# Patient Record
Sex: Female | Born: 1990
Health system: Southern US, Community
[De-identification: ages and names within clinical notes are randomized; demographics above are authoritative.]

## PROBLEM LIST (undated history)

## (undated) ENCOUNTER — Inpatient Hospital Stay (HOSPITAL_COMMUNITY): Payer: Self-pay

## (undated) DIAGNOSIS — T8859XA Other complications of anesthesia, initial encounter: Secondary | ICD-10-CM

## (undated) DIAGNOSIS — I499 Cardiac arrhythmia, unspecified: Secondary | ICD-10-CM

## (undated) DIAGNOSIS — E119 Type 2 diabetes mellitus without complications: Secondary | ICD-10-CM

## (undated) DIAGNOSIS — R000 Tachycardia, unspecified: Secondary | ICD-10-CM

## (undated) DIAGNOSIS — R7303 Prediabetes: Secondary | ICD-10-CM

## (undated) DIAGNOSIS — O24419 Gestational diabetes mellitus in pregnancy, unspecified control: Secondary | ICD-10-CM

## (undated) DIAGNOSIS — R002 Palpitations: Secondary | ICD-10-CM

## (undated) DIAGNOSIS — G43909 Migraine, unspecified, not intractable, without status migrainosus: Secondary | ICD-10-CM

## (undated) DIAGNOSIS — R079 Chest pain, unspecified: Secondary | ICD-10-CM

## (undated) DIAGNOSIS — F32A Depression, unspecified: Secondary | ICD-10-CM

## (undated) DIAGNOSIS — T4145XA Adverse effect of unspecified anesthetic, initial encounter: Secondary | ICD-10-CM

## (undated) DIAGNOSIS — F329 Major depressive disorder, single episode, unspecified: Secondary | ICD-10-CM

## (undated) DIAGNOSIS — F419 Anxiety disorder, unspecified: Secondary | ICD-10-CM

## (undated) HISTORY — DX: Gestational diabetes mellitus in pregnancy, unspecified control: O24.419

## (undated) HISTORY — PX: NO PAST SURGERIES: SHX2092

## (undated) HISTORY — DX: Major depressive disorder, single episode, unspecified: F32.9

## (undated) HISTORY — DX: Other complications of anesthesia, initial encounter: T88.59XA

## (undated) HISTORY — DX: Depression, unspecified: F32.A

## (undated) HISTORY — DX: Adverse effect of unspecified anesthetic, initial encounter: T41.45XA

## (undated) HISTORY — PX: WISDOM TOOTH EXTRACTION: SHX21

---
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
## 2009-04-29 ENCOUNTER — Emergency Department: Payer: Self-pay | Admitting: Emergency Medicine

## 2016-03-10 ENCOUNTER — Encounter (HOSPITAL_COMMUNITY): Payer: Self-pay | Admitting: *Deleted

## 2016-03-10 ENCOUNTER — Inpatient Hospital Stay (HOSPITAL_COMMUNITY)
Admission: AD | Admit: 2016-03-10 | Discharge: 2016-03-10 | Disposition: A | Discharge status: Home / Self Care | Payer: Medicaid Other | Source: Ambulatory Visit | Attending: Obstetrics & Gynecology | Admitting: Obstetrics & Gynecology

## 2016-03-10 DIAGNOSIS — R399 Unspecified symptoms and signs involving the genitourinary system: Secondary | ICD-10-CM | POA: Diagnosis not present

## 2016-03-10 DIAGNOSIS — R350 Frequency of micturition: Secondary | ICD-10-CM | POA: Diagnosis present

## 2016-03-10 LAB — URINALYSIS, ROUTINE W REFLEX MICROSCOPIC
Bilirubin Urine: NEGATIVE
Glucose, UA: NEGATIVE mg/dL
Hgb urine dipstick: NEGATIVE
Ketones, ur: 15 mg/dL — AB
Leukocytes, UA: NEGATIVE
Nitrite: NEGATIVE
Protein, ur: NEGATIVE mg/dL
Specific Gravity, Urine: 1.025 (ref 1.005–1.030)
pH: 6.5 (ref 5.0–8.0)

## 2016-03-10 LAB — POCT PREGNANCY, URINE: Preg Test, Ur: NEGATIVE

## 2016-03-10 MED ORDER — CIPROFLOXACIN HCL 500 MG PO TABS: 500.0000 mg | ORAL_TABLET | Freq: Two times a day (BID) | ORAL | 0 refills | Status: DC

## 2016-03-10 NOTE — MAU Provider Note (Addendum)
  Faculty Practice OB/GYN MAU Attending Note  Subjective:  25 y.o. G1P1001 presents today with c/o of urinating too much. Also has suprapubic pain. Denies fevers, vaginal discharge, chills, polyphagia or polydipsia.  Symptoms have been going on for last 3 days.   Also requests comprehensive STI screening, "I am here anyway, so wanted to get it done".  Objective:  Blood pressure 128/71, pulse 81, temperature 98.2 F (36.8 C), temperature source Oral, resp. rate 18, weight 217 lb 12 oz (98.8 kg), last menstrual period 02/18/2016. GENERAL: Well-developed, well-nourished female in no acute distress.  HEENT: Normocephalic, atraumatic.   LUNGS: Normal respiratory effort HEART: Regular rate noted SKIN: Warm, dry and without erythema ABDOMEN: Soft, nondistended, mild suprapubic tenderness PSYCH: Normal mood and affect  Results for orders placed or performed during the hospital encounter of 03/10/16 (from the past 24 hour(s))  Urinalysis, Routine w reflex microscopic (not at [facility])     Status: Abnormal   Collection Time: 03/10/16  4:33 PM  Result Value Ref Range   Color, Urine YELLOW YELLOW   APPearance CLEAR CLEAR   Specific Gravity, Urine 1.025 1.005 - 1.030   pH 6.5 5.0 - 8.0   Glucose, UA NEGATIVE NEGATIVE mg/dL   Hgb urine dipstick NEGATIVE NEGATIVE   Bilirubin Urine NEGATIVE NEGATIVE   Ketones, ur 15 (A) NEGATIVE mg/dL   Protein, ur NEGATIVE NEGATIVE mg/dL   Nitrite NEGATIVE NEGATIVE   Leukocytes, UA NEGATIVE NEGATIVE  Pregnancy, urine POC     Status: None   Collection Time: 03/10/16  4:44 PM  Result Value Ref Range   Preg Test, Ur NEGATIVE NEGATIVE    Assessment & Plan:  UTI symptoms with negative UA. Will follow up urine culture Ciprofloxacin 500 mg po bid x 3 days prescribed Told to follow up with [facility] or [facility] for STI testing. Discharged to home.     Jaynie CollinsUGONNA  Nazim Kadlec, MD, FACOG Attending Obstetrician & Gynecologist Faculty Practice, [facility]  [place]

## 2016-03-10 NOTE — Discharge Instructions (Signed)

## 2016-03-10 NOTE — MAU Note (Signed)
Night before last, kept having to pee- couldn't hold.  Has Continued. Feeling pressure in lower abd.  Still feels like she needs to go after she does go. No pain associated with urination.

## 2016-03-12 LAB — URINE CULTURE

## 2016-11-05 ENCOUNTER — Emergency Department (HOSPITAL_COMMUNITY)
Admission: EM | Admit: 2016-11-05 | Discharge: 2016-11-05 | Disposition: A | Discharge status: Home / Self Care | Payer: Medicaid Other | Attending: Emergency Medicine | Admitting: Emergency Medicine

## 2016-11-05 ENCOUNTER — Encounter (HOSPITAL_COMMUNITY): Payer: Self-pay | Admitting: Nurse Practitioner

## 2016-11-05 DIAGNOSIS — M722 Plantar fascial fibromatosis: Secondary | ICD-10-CM

## 2016-11-05 MED ORDER — MELOXICAM 15 MG PO TABS: 15.0000 mg | ORAL_TABLET | Freq: Every day | ORAL | 0 refills | Status: DC

## 2016-11-05 NOTE — ED Triage Notes (Signed)
Pt presents with c/o L foot pain. The pain began about 1 month ago while she was doing strenuous work remodeling her home. The pain has gotten increasingly worse since onset. She denies any known injiures. shes tried epsom salt soaks, icy cold rub, rest, elevation, ibuprofen,  with no relief

## 2016-11-05 NOTE — ED Notes (Signed)
Patient Alert and oriented X4. Stable and ambulatory. Patient verbalized understanding of the discharge instructions.  Patient belongings were taken by the patient.  

## 2016-11-05 NOTE — ED Provider Notes (Signed)
[facility]-EMERGENCY DEPT Provider Note   CSN: [id number] Arrival date & time: 11/05/16  1529   By signing my name below, I, Clarisse Gouge, attest that this documentation has been prepared under the direction and in the presence of Ranyah Groeneveld, PA-C. Electronically signed, Clarisse Gouge, ED Scribe. 11/05/16. 4:23 PM.   History   Chief Complaint Chief Complaint  Patient presents with  . Foot Pain   The history is provided by the patient and medical records. No language interpreter was used.    Colleen Fields is a 26 y.o. female presents to the Emergency Department with concern for gradually worsening L foot pain x 1 month. She states she has been standing constantly for greater periods of time than normally while remodeling over the past month. She describes 10/10, aching, constant pain noted to the arch of the L foot. Pt reportedly wearing Nike airmax and states she has used undisclosed insoles without relief. No improvement noted with epsom salt soaks, icy hot rubs, rest, elevation or ibuprofen. No other modifying factors noted. No injuries or traumas noted to the foot.  Past Medical History:  Diagnosis Date  . Medical history non-contributory     There are no active problems to display for this patient.   Past Surgical History:  Procedure Laterality Date  . CESAREAN SECTION    . WISDOM TOOTH EXTRACTION      OB History    Gravida Para Term Preterm AB Living   SAB TAB Ectopic Multiple Live Births                   Home Medications    Prior to Admission medications   Medication Sig Start Date End Date Taking? Authorizing Provider  ciprofloxacin (CIPRO) 500 MG tablet Take 1 tablet (500 mg total) by mouth 2 (two) times daily. 03/10/16   Tereso Newcomer, MD    Family History History reviewed. No pertinent family history.  Social History Social History  Substance Use Topics  . Smoking status: Never Smoker  . Smokeless tobacco: Never Used  . Alcohol  use No     Allergies   Patient has no known allergies.   Review of Systems Review of Systems  Constitutional: Negative for chills and fever.  Musculoskeletal: Positive for arthralgias and gait problem. Negative for joint swelling.  Skin: Negative for wound.  Neurological: Negative for weakness and numbness.  All other systems reviewed and are negative.    Physical Exam Updated Vital Signs BP (!) 104/46   Pulse 90   Temp 98.8 F (37.1 C) (Oral)   Resp 17   SpO2 100%   Physical Exam  Constitutional: She is oriented to person, place, and time. She appears well-developed and well-nourished.  HENT:  Head: Normocephalic and atraumatic.  Eyes: Conjunctivae are normal. Pupils are equal, round, and reactive to light. Right eye exhibits no discharge. Left eye exhibits no discharge. No scleral icterus.  Neck: Normal range of motion. No JVD present. No tracheal deviation present.  Pulmonary/Chest: Effort normal. No stridor.  Musculoskeletal:  Normal appearing left foot. TTP over posterior tibial tendons and arch of the foot. No erythema, swelling. Pain with ankle dorsiflexion and toes dorsiflexion. Full ROM of foot and toes.   Neurological: She is alert and oriented to person, place, and time. Coordination normal.  Psychiatric: She has a normal mood and affect. Her behavior is normal. Judgment and thought content normal.  Nursing note and  vitals reviewed.    ED Treatments / Results  DIAGNOSTIC STUDIES: Oxygen Saturation is 100% on RA, NL by my interpretation.    COORDINATION OF CARE: 4:20 PM-Discussed next steps with pt. Pt verbalized understanding and is agreeable with the plan. Will order medication and ace wrap, then prepare for d/c and refer to podiatrist. Pt advised of symptomatic care at home and return precautions.    Labs (all labs ordered are listed, but only abnormal results are displayed) Labs Reviewed - No data to display  EKG  EKG Interpretation None        Radiology No results found.  Procedures Procedures (including critical care time)  Medications Ordered in ED Medications - No data to display   Initial Impression / Assessment and Plan / ED Course  I have reviewed the triage vital signs and the nursing notes.  Pertinent labs & imaging results that were available during my care of the patient were reviewed by me and considered in my medical decision making (see chart for details).     Pt in ed with what sounds like plantar fascitis. No trauma. No signs of infection. Full ROM of the foot and ankle. Unable to ambulate. Advised Ice, elevate, rest. Advised to get rigid inserts to help support arch. Mobic for inflammation. I do not think pt needs emergent imaging. Follow up with PCP or podiatry.   Vitals:   11/05/16 1535  BP: (!) 104/46  Pulse: 90  Resp: 17  Temp: 98.8 F (37.1 C)  TempSrc: Oral  SpO2: 100%     Final Clinical Impressions(s) / ED Diagnoses   Final diagnoses:  Plantar fasciitis of left foot    New Prescriptions New Prescriptions   MELOXICAM (MOBIC) 15 MG TABLET    Take 1 tablet (15 mg total) by mouth daily.   I personally performed the services described in this documentation, which was scribed in my presence. The recorded information has been reviewed and is accurate.    Jaynie Crumble, PA-C 11/05/16 1638    Lyndal Pulley, MD 11/05/16 Mikle Bosworth

## 2016-11-05 NOTE — Discharge Instructions (Signed)
Mobic daily. Get good rigid arch supports to prevent your arch from collapsing. Ice massage with frozen water bottle. Rest. Elevate. Crutches as needed. Follow up with orthopedics or podiatrist.

## 2017-06-19 ENCOUNTER — Encounter (HOSPITAL_COMMUNITY): Payer: Self-pay

## 2017-06-19 ENCOUNTER — Inpatient Hospital Stay (HOSPITAL_COMMUNITY)
Admission: AD | Admit: 2017-06-19 | Discharge: 2017-06-19 | Disposition: A | Discharge status: Home / Self Care | Payer: Self-pay | Source: Ambulatory Visit | Attending: Obstetrics and Gynecology | Admitting: Obstetrics and Gynecology

## 2017-06-19 DIAGNOSIS — Z3490 Encounter for supervision of normal pregnancy, unspecified, unspecified trimester: Secondary | ICD-10-CM

## 2017-06-19 DIAGNOSIS — O161 Unspecified maternal hypertension, first trimester: Secondary | ICD-10-CM

## 2017-06-19 DIAGNOSIS — Z3A09 9 weeks gestation of pregnancy: Secondary | ICD-10-CM | POA: Insufficient documentation

## 2017-06-19 DIAGNOSIS — Z711 Person with feared health complaint in whom no diagnosis is made: Secondary | ICD-10-CM

## 2017-06-19 DIAGNOSIS — O26891 Other specified pregnancy related conditions, first trimester: Secondary | ICD-10-CM | POA: Insufficient documentation

## 2017-06-19 DIAGNOSIS — R109 Unspecified abdominal pain: Secondary | ICD-10-CM | POA: Insufficient documentation

## 2017-06-19 HISTORY — DX: Migraine, unspecified, not intractable, without status migrainosus: G43.909

## 2017-06-19 LAB — URINALYSIS, ROUTINE W REFLEX MICROSCOPIC
Bilirubin Urine: NEGATIVE
Glucose, UA: NEGATIVE mg/dL
Hgb urine dipstick: NEGATIVE
Ketones, ur: NEGATIVE mg/dL
Leukocytes, UA: NEGATIVE
Nitrite: NEGATIVE
Protein, ur: NEGATIVE mg/dL
Specific Gravity, Urine: 1.021 (ref 1.005–1.030)
pH: 6 (ref 5.0–8.0)

## 2017-06-19 LAB — POCT PREGNANCY, URINE: Preg Test, Ur: POSITIVE — AB

## 2017-06-19 MED ORDER — PRENATAL GUMMIES/DHA & FA 0.4-32.5 MG PO CHEW: 1.0000 | CHEWABLE_TABLET | Freq: Every day | ORAL | 12 refills | Status: DC

## 2017-06-19 NOTE — Discharge Instructions (Signed)
First Trimester of Pregnancy The first trimester of pregnancy is from week 1 until the end of week 13 (months 1 through 3). A week after a sperm fertilizes an egg, the egg will implant on the wall of the uterus. This embryo will begin to develop into a baby. Genes from you and your partner will form the baby. The female genes will determine whether the baby will be a boy or a girl. At 6-8 weeks, the eyes and face will be formed, and the heartbeat can be seen on ultrasound. At the end of 12 weeks, all the baby's organs will be formed. Now that you are pregnant, you will want to do everything you can to have a healthy baby. Two of the most important things are to get good prenatal care and to follow your health care provider's instructions. Prenatal care is all the medical care you receive before the baby's birth. This care will help prevent, find, and treat any problems during the pregnancy and childbirth. Body changes during your first trimester Your body goes through many changes during pregnancy. The changes vary from woman to woman.  You may gain or lose a couple of pounds at first.  You may feel sick to your stomach (nauseous) and you may throw up (vomit). If the vomiting is uncontrollable, call your health care provider.  You may tire easily.  You may develop headaches that can be relieved by medicines. All medicines should be approved by your health care provider.  You may urinate more often. Painful urination may mean you have a bladder infection.  You may develop heartburn as a result of your pregnancy.  You may develop constipation because certain hormones are causing the muscles that push stool through your intestines to slow down.  You may develop hemorrhoids or swollen veins (varicose veins).  Your breasts may begin to grow larger and become tender. Your nipples may stick out more, and the tissue that surrounds them (areola) may become darker.  Your gums may bleed and may be  sensitive to brushing and flossing.  Dark spots or blotches (chloasma, mask of pregnancy) may develop on your face. This will likely fade after the baby is born.  Your menstrual periods will stop.  You may have a loss of appetite.  You may develop cravings for certain kinds of food.  You may have changes in your emotions from day to day, such as being excited to be pregnant or being concerned that something may go wrong with the pregnancy and baby.  You may have more vivid and strange dreams.  You may have changes in your hair. These can include thickening of your hair, rapid growth, and changes in texture. Some women also have hair loss during or after pregnancy, or hair that feels dry or thin. Your hair will most likely return to normal after your baby is born.  What to expect at prenatal visits During a routine prenatal visit:  You will be weighed to make sure you and the baby are growing normally.  Your blood pressure will be taken.  Your abdomen will be measured to track your baby's growth.  The fetal heartbeat will be listened to between weeks 10 and 14 of your pregnancy.  Test results from any previous visits will be discussed.  Your health care provider may ask you:  How you are feeling.  If you are feeling the baby move.  If you have had any abnormal symptoms, such as leaking fluid, bleeding, severe headaches,   or abdominal cramping.  If you are using any tobacco products, including cigarettes, chewing tobacco, and electronic cigarettes.  If you have any questions.  Other tests that may be performed during your first trimester include:  Blood tests to find your blood type and to check for the presence of any previous infections. The tests will also be used to check for low iron levels (anemia) and protein on red blood cells (Rh antibodies). Depending on your risk factors, or if you previously had diabetes during pregnancy, you may have tests to check for high blood  sugar that affects pregnant women (gestational diabetes).  Urine tests to check for infections, diabetes, or protein in the urine.  An ultrasound to confirm the proper growth and development of the baby.  Fetal screens for spinal cord problems (spina bifida) and Down syndrome.  HIV (human immunodeficiency virus) testing. Routine prenatal testing includes screening for HIV, unless you choose not to have this test.  You may need other tests to make sure you and the baby are doing well.  Follow these instructions at home: Medicines  Follow your health care provider's instructions regarding medicine use. Specific medicines may be either safe or unsafe to take during pregnancy.  Take a prenatal vitamin that contains at least 600 micrograms (mcg) of folic acid.  If you develop constipation, try taking a stool softener if your health care provider approves. Eating and drinking  Eat a balanced diet that includes fresh fruits and vegetables, whole grains, good sources of protein such as meat, eggs, or tofu, and low-fat dairy. Your health care provider will help you determine the amount of weight gain that is right for you.  Avoid raw meat and uncooked cheese. These carry germs that can cause birth defects in the baby.  Eating four or five small meals rather than three large meals a day may help relieve nausea and vomiting. If you start to feel nauseous, eating a few soda crackers can be helpful. Drinking liquids between meals, instead of during meals, also seems to help ease nausea and vomiting.  Limit foods that are high in fat and processed sugars, such as fried and sweet foods.  To prevent constipation: ? Eat foods that are high in fiber, such as fresh fruits and vegetables, whole grains, and beans. ? Drink enough fluid to keep your urine clear or pale yellow. Activity  Exercise only as directed by your health care provider. Most women can continue their usual exercise routine during  pregnancy. Try to exercise for 30 minutes at least 5 days a week. Exercising will help you: ? Control your weight. ? Stay in shape. ? Be prepared for labor and delivery.  Experiencing pain or cramping in the lower abdomen or lower back is a good sign that you should stop exercising. Check with your health care provider before continuing with normal exercises.  Try to avoid standing for long periods of time. Move your legs often if you must stand in one place for a long time.  Avoid heavy lifting.  Wear low-heeled shoes and practice good posture.  You may continue to have sex unless your health care provider tells you not to. Relieving pain and discomfort  Wear a good support bra to relieve breast tenderness.  Take warm sitz baths to soothe any pain or discomfort caused by hemorrhoids. Use hemorrhoid cream if your health care provider approves.  Rest with your legs elevated if you have leg cramps or low back pain.  If you develop   varicose veins in your legs, wear support hose. Elevate your feet for 15 minutes, 3-4 times a day. Limit salt in your diet. Prenatal care  Schedule your prenatal visits by the twelfth week of pregnancy. They are usually scheduled monthly at first, then more often in the last 2 months before delivery.  Write down your questions. Take them to your prenatal visits.  Keep all your prenatal visits as told by your health care provider. This is important. Safety  Wear your seat belt at all times when driving.  Make a list of emergency phone numbers, including numbers for family, friends, the hospital, and police and fire departments. General instructions  Ask your health care provider for a referral to a local prenatal education class. Begin classes no later than the beginning of month 6 of your pregnancy.  Ask for help if you have counseling or nutritional needs during pregnancy. Your health care provider can offer advice or refer you to specialists for help  with various needs.  Do not use hot tubs, steam rooms, or saunas.  Do not douche or use tampons or scented sanitary pads.  Do not cross your legs for long periods of time.  Avoid cat litter boxes and soil used by cats. These carry germs that can cause birth defects in the baby and possibly loss of the fetus by miscarriage or stillbirth.  Avoid all smoking, herbs, alcohol, and medicines not prescribed by your health care provider. Chemicals in these products affect the formation and growth of the baby.  Do not use any products that contain nicotine or tobacco, such as cigarettes and e-cigarettes. If you need help quitting, ask your health care provider. You may receive counseling support and other resources to help you quit.  Schedule a dentist appointment. At home, brush your teeth with a soft toothbrush and be gentle when you floss. Contact a health care provider if:  You have dizziness.  You have mild pelvic cramps, pelvic pressure, or nagging pain in the abdominal area.  You have persistent nausea, vomiting, or diarrhea.  You have a bad smelling vaginal discharge.  You have pain when you urinate.  You notice increased swelling in your face, hands, legs, or ankles.  You are exposed to fifth disease or chickenpox.  You are exposed to German measles (rubella) and have never had it. Get help right away if:  You have a fever.  You are leaking fluid from your vagina.  You have spotting or bleeding from your vagina.  You have severe abdominal cramping or pain.  You have rapid weight gain or loss.  You vomit blood or material that looks like coffee grounds.  You develop a severe headache.  You have shortness of breath.  You have any kind of trauma, such as from a fall or a car accident. Summary  The first trimester of pregnancy is from week 1 until the end of week 13 (months 1 through 3).  Your body goes through many changes during pregnancy. The changes vary from  woman to woman.  You will have routine prenatal visits. During those visits, your health care provider will examine you, discuss any test results you may have, and talk with you about how you are feeling. This information is not intended to replace advice given to you by your health care provider. Make sure you discuss any questions you have with your health care provider. Document Released: 06/23/2001 Document Revised: 06/10/2016 Document Reviewed: 06/10/2016 Elsevier Interactive Patient Education  2017 [employer]   Inc.  

## 2017-06-19 NOTE — MAU Note (Signed)
Missed period in November and had + HUPT last week. Was taking OC but does not want husband to know about OC. Is having some cramping, no vaginal bleeding.

## 2017-06-19 NOTE — MAU Provider Note (Signed)
History   G2P1 early preg in concerned regarding need for prenatal care. Denies pain or vag bleeding.   CSN: [id number]  Arrival date & time 06/19/17  1049   None     Chief Complaint  Patient presents with  . Abdominal Pain    HPI  Past Medical History:  Diagnosis Date  . Migraine     Past Surgical History:  Procedure Laterality Date  . CESAREAN SECTION    . WISDOM TOOTH EXTRACTION      Family History  Problem Relation Age of Onset  . Hypertension Mother     Social History   Tobacco Use  . Smoking status: Never Smoker  . Smokeless tobacco: Never Used  Substance Use Topics  . Alcohol use: No  . Drug use: No    Comment: "hasn't smoked in forever"    OB History    Gravida Para Term Preterm AB Living   2 1 1     1    SAB TAB Ectopic Multiple Live Births                  Review of Systems  Constitutional: Negative.   HENT: Negative.   Eyes: Negative.   Respiratory: Negative.   Cardiovascular: Negative.   Gastrointestinal: Negative.   Endocrine: Negative.   Genitourinary: Negative.   Musculoskeletal: Negative.   Skin: Negative.   Allergic/Immunologic: Negative.   Neurological: Negative.   Hematological: Negative.   Psychiatric/Behavioral: Negative.     Allergies  Latex  Home Medications   Current Outpatient Rx  . Order #: [id number] Class: Normal    BP (!) 154/73 (BP Location: Right Arm)   Pulse 83   Temp 97.8 F (36.6 C) (Oral)   Resp 18   LMP 04/23/2017   Physical Exam  Constitutional: She is oriented to person, place, and time. She appears well-developed and well-nourished.  HENT:  Head: Normocephalic.  Neck: Normal range of motion.  Cardiovascular: Normal rate, regular rhythm, normal heart sounds and intact distal pulses.  Pulmonary/Chest: Effort normal and breath sounds normal.  Abdominal: Soft. Bowel sounds are normal.  Musculoskeletal: Normal range of motion.  Neurological: She is alert and oriented to person, place, and time.  She has normal reflexes.  Skin: Skin is warm and dry.  Psychiatric: She has a normal mood and affect. Her behavior is normal. Judgment and thought content normal.    MAU Course  Procedures (including critical care time)  Labs Reviewed  POCT PREGNANCY, URINE - Abnormal; Notable for the following components:      Result Value   Preg Test, Ur POSITIVE (*)    All other components within normal limits  URINALYSIS, ROUTINE W REFLEX MICROSCOPIC   No results found.   1. Physically well but worried   2. Pregnancy at early stage       MDM  BP sl elevated on admit. Bedside u/s show viable early pregnant with good FHR. Message sent to clinic to get pt in for care and assessment. Will d/c home.

## 2017-07-13 NOTE — L&D Delivery Note (Signed)
Patient is 27 y.o. G2P1001 4854w2d admitted for IOL for A2GDM(insulin). S/p IOL with foley bulb followed by Pitocin. AROM at 1210.  Prenatal course also complicated by GDM, obesity, h/o depression, prior c-section.  Delivery Note At 3:32 AM a viable female was delivered via Vaginal, Spontaneous (Presentation: OA).  APGAR: 7, 9; weight pending skin to skin.   Placenta status: Intact.  Cord: 3V with the following complications: None.  Cord pH: N/A  Anesthesia: Epidural  Episiotomy: None Lacerations: Sulcus Suture Repair: 3.0 vicryl Est. Blood Loss (mL):  200  Mom to postpartum.  Baby to Couplet care / Skin to Skin.  Caryl AdaJazma Avayah Raffety, DO 01/23/2018, 4:30 AM OB Fellow Center for [facility], [facility]

## 2017-07-22 ENCOUNTER — Other Ambulatory Visit (HOSPITAL_COMMUNITY)
Admission: RE | Admit: 2017-07-22 | Discharge: 2017-07-22 | Disposition: A | Discharge status: Home / Self Care | Payer: Medicaid Other | Source: Ambulatory Visit | Attending: Certified Nurse Midwife | Admitting: Certified Nurse Midwife

## 2017-07-22 ENCOUNTER — Encounter: Payer: Self-pay | Admitting: Certified Nurse Midwife

## 2017-07-22 ENCOUNTER — Ambulatory Visit (INDEPENDENT_AMBULATORY_CARE_PROVIDER_SITE_OTHER): Payer: Medicaid Other | Admitting: Certified Nurse Midwife

## 2017-07-22 ENCOUNTER — Encounter: Payer: Medicaid Other | Admitting: Certified Nurse Midwife

## 2017-07-22 ENCOUNTER — Other Ambulatory Visit: Payer: Self-pay

## 2017-07-22 VITALS — BP 112/74 | HR 87 | Ht 59.0 in | Wt 237.1 lb

## 2017-07-22 DIAGNOSIS — E669 Obesity, unspecified: Secondary | ICD-10-CM

## 2017-07-22 DIAGNOSIS — O0990 Supervision of high risk pregnancy, unspecified, unspecified trimester: Secondary | ICD-10-CM | POA: Insufficient documentation

## 2017-07-22 DIAGNOSIS — O99210 Obesity complicating pregnancy, unspecified trimester: Secondary | ICD-10-CM | POA: Insufficient documentation

## 2017-07-22 DIAGNOSIS — Z3481 Encounter for supervision of other normal pregnancy, first trimester: Secondary | ICD-10-CM | POA: Diagnosis not present

## 2017-07-22 DIAGNOSIS — Z3480 Encounter for supervision of other normal pregnancy, unspecified trimester: Secondary | ICD-10-CM

## 2017-07-22 DIAGNOSIS — Z3A00 Weeks of gestation of pregnancy not specified: Secondary | ICD-10-CM | POA: Insufficient documentation

## 2017-07-22 DIAGNOSIS — Z98891 History of uterine scar from previous surgery: Secondary | ICD-10-CM | POA: Insufficient documentation

## 2017-07-22 DIAGNOSIS — O99211 Obesity complicating pregnancy, first trimester: Secondary | ICD-10-CM

## 2017-07-22 HISTORY — DX: Obesity, unspecified: E66.9

## 2017-07-22 LAB — POCT URINALYSIS DIP (DEVICE)
Bilirubin Urine: NEGATIVE
Glucose, UA: NEGATIVE mg/dL
Hgb urine dipstick: NEGATIVE
Leukocytes, UA: NEGATIVE
Nitrite: NEGATIVE
Protein, ur: NEGATIVE mg/dL
Specific Gravity, Urine: >=1.03 (ref 1.005–1.030)
Urobilinogen, UA: 0.2 mg/dL (ref 0.0–1.0)
pH: 6.5 (ref 5.0–8.0)

## 2017-07-22 MED ORDER — ASPIRIN EC 81 MG PO TBEC: 81.0000 mg | DELAYED_RELEASE_TABLET | Freq: Every day | ORAL | 2 refills | Status: DC

## 2017-07-22 NOTE — Patient Instructions (Addendum)
AREA PEDIATRIC/FAMILY PRACTICE PHYSICIANS  [facility] [address], [address] [place], [place]  [address] Phone - [phone]   Fax - [phone]  [facility] [address] [place], [place] [address] Phone - [phone]   Fax - [phone]  JACK AM[facility] [address] [place], [place]  [address] Phone - [phone]   Fax - [phone]  [facility] [address] [place], [place]  [address] Phone - [phone]   Fax - [phone]  [facility] [address] [place], [place]  [address] Phone - [phone]   Fax - [phone]  [facility] [address] [place], [place]  [address] Phone - [phone]   Fax - [phone]  [facility] [address] [place], [place]  [address] Phone - [phone]   Fax - [phone]  [facility] [address] [place], [place]  [address] Phone - [phone]   Fax - [phone]  [facility] [address] [place], [place]  [address] Phone - [phone]   Fax - [phone] [facility] [address] [place], [place]  [address] Phone - [phone]   Fax - [phone]  [facility] [address] [place], [place]  [address] Phone - [phone]   Fax - [phone]  [facility] [address] [place], [place]  [address] Phone - [phone]   Fax - [phone]  [facility] [address] [place], [place]  [address] Phone - [phone]   Fax - [phone]  SHILPA GOSRANI [address] [place], [place]  [address] Phone - [phone]  [facility] [address] [place], [place]  [address] Phone - [phone]   Fax - [phone]  [facility] [address] [place], [place]  [address] Phone - [phone]   Fax - [phone]  [facility] [address] [place], [place]  [address] Phone - [phone]   Fax - [phone]  [facility] [address] [place], [place]  [address] Phone - [phone]   Fax - [phone]   [facility] [address] [place], [place]  [address] Phone - [phone]   Fax - [phone]  [facility] [address] [place], [place]  [address] Phone - [phone]   Fax - [phone]  DAVID RUBIN [address] , [place]  [address] Phone - [phone]   Fax - [phone]  [facility] [address] , [place]  [address] Phone - [phone]   Fax - [phone]  [facility] [address] , [place]  [address] Phone - [phone]   Fax - [phone] [facility] [address] [place], [place]  [address] Phone - [phone]   Fax - [phone]  [facility] [address] [place], [place]  [address] Phone - [phone]   Fax - [phone]  [facility] [address] [place], [place]  [address] Phone - [phone]   Fax - [phone]  [facility] Charlene Flemming MD [address] [place] [address] Phone [phone]  Fax [phone]  Childbirth Education Options: [facility] Classes:  Childbirth education classes can help you   get ready for a positive parenting experience. You can also meet other expectant parents and get free stuff for your baby. Each class runs for five weeks on the same night and costs $45 for the mother-to-be and her support person. Medicaid covers the cost if you are eligible. Call [phone] to register. [facility] Childbirth Education:  [phone] or 5174845873 or  [email or web address]  Baby & Me Class: Discuss newborn & infant parenting and family adjustment issues with other new mothers in a relaxed environment. Each week brings a new speaker or baby-centered activity. We encourage new mothers to join [place] every Thursday at 11:00am. Babies birth until crawling. No registration or fee. Daddy [employer]: This course offers Dads-to-be the tools and knowledge needed to feel confident on their journey to becoming new fathers. Experienced dads, who have been trained as coaches, teach dads-to-be how to hold, comfort, diaper, swaddle and play with their infant while being able to support the new mom as well. A class for men taught by men. $25/dad Big Brother/Big Sister: Let your children share in the joy of a new brother or sister in this special class designed just for them. Class includes discussion about how families care for babies: swaddling, holding, diapering, safety as well as how they can be helpful in their new role. This class is designed for children ages 26 to 31, but any age is welcome. Please register each child individually. $5/child  Mom Talk: This mom-led group offers support and connection to mothers as they journey through the adjustments and struggles of that sometimes overwhelming first year after the birth of a child. Tuesdays at 10:00am and Thursdays at 6:00pm. Babies welcome. No registration or fee. Breastfeeding Support Group: This group is a mother-to-mother support circle where moms have the opportunity to share their breastfeeding experiences. A Lactation Consultant is present for questions and concerns. Meets each Tuesday at 11:00am. No fee or registration. Breastfeeding Your Baby: Learn what to expect in the first days of breastfeeding your newborn.  This class will help you feel more confident with the skills needed to begin your breastfeeding experience. Many new mothers are concerned about breastfeeding after leaving the hospital. This class  will also address the most common fears and challenges about breastfeeding during the first few weeks, months and beyond. (call for fee) Comfort Techniques and Tour: This 2 hour interactive class will provide you the opportunity to learn & practice hands-on techniques that can help relieve some of the discomfort of labor and encourage your baby to rotate toward the best position for birth. You and your partner will be able to try a variety of labor positions with birth balls and rebozos as well as practice breathing, relaxation, and visualization techniques. A tour of the [facility] is included with this class. $20 per registrant and support person Childbirth Class- Weekend Option: This class is a Weekend version of our Birth & Baby series. It is designed for parents who have a difficult time fitting several weeks of classes into their schedule. It covers the care of your newborn and the basics of labor and childbirth. It also includes a [place] [facility] and lunch. The class is held two consecutive days: beginning on Friday evening from 6:30 - 8:30 p.m. and the next day, Saturday from 9 a.m. - 4 p.m. (call for fee) Doren Custard Class: Interested in a waterbirth?  This informational class will help you discover whether waterbirth is the right fit for you.  Education about waterbirth itself, supplies you would need and how to assemble your support team is what you can expect from this class. Some obstetrical practices require this class in order to pursue a waterbirth. (Not all obstetrical practices offer waterbirth-check with your healthcare provider.) Register only the expectant mom, but you are encouraged to bring your partner to class! Required if planning waterbirth, no fee. Infant/Child CPR: Parents, grandparents, babysitters, and friends learn Cardio-Pulmonary Resuscitation skills for infants and children. You will also learn how to treat both conscious  and unconscious choking in infants and children. This Family & Friends program does not offer certification. Register each participant individually to ensure that enough mannequins are available. (Call for fee) Grandparent Love: Expecting a grandbaby? This class is for you! Learn about the latest infant care and safety recommendations and ways to support your own child as he or she transitions into the parenting role. Taught by Registered Nurses who are childbirth instructors, but most importantly...they are grandmothers too! $10/person. Childbirth Class- Natural Childbirth: This series of 5 weekly classes is for expectant parents who want to learn and practice natural methods of coping with the process of labor and childbirth. Relaxation, breathing, massage, visualization, role of the partner, and helpful positioning are highlighted. Participants learn how to be confident in their body's ability to give birth. This class will empower and help parents make informed decisions about their own care. Includes discussion that will help new parents transition into the immediate postpartum period. [facility] is included. We suggest taking this class between 25-32 weeks, but it's only a recommendation. $75 per registrant and one support person or $30 Medicaid. Childbirth Class- 3 week Series: This option of 3 weekly classes helps you and your labor partner prepare for childbirth. Newborn care, labor & birth, cesarean birth, pain management, and comfort techniques are discussed and a [place] [facility] is included. The class meets at the same time, on the same day of the week for 3 consecutive weeks beginning with the starting date you choose. $60 for registrant and one support person.  Marvelous Multiples: Expecting twins, triplets, or more? This class covers the differences in labor, birth, parenting, and breastfeeding issues that face multiples' parents.  NICU tour is included. Led by a Certified Childbirth Educator who is the mother of twins. No fee. Caring for Baby: This class is for expectant and adoptive parents who want to learn and practice the most up-to-date newborn care for their babies. Focus is on birth through the first six weeks of life. Topics include feeding, bathing, diapering, crying, umbilical cord care, circumcision care and safe sleep. Parents learn to recognize symptoms of illness and when to call the pediatrician. Register only the mom-to-be and your partner or support person can plan to come with you! $10 per registrant and support person Childbirth Class- online option: This online class offers you the freedom to complete a Birth and Baby series in the comfort of your own home. The flexibility of this option allows you to review sections at your own pace, at times convenient to you and your support people. It includes additional video information, animations, quizzes, and extended activities. Get organized with helpful eClass tools, checklists, and trackers. Once you register online for the class, you will receive an email within a few days to accept the invitation and begin the class when the time is right for you. The content will be available to you for 60 days. $  60 for 60 days of online access for you and your support people.  Local Doulas: Natural Baby Doulas [email or web address] Tel: [phone] [email or web address] [employer] [phone] [email or web address] .[email or web address] The Labor Hassell Halim  (also do waterbirth tub rental) [phone] [email or web address] .[email or web address] [facility] Doula ([phone] [email or web address] NotebookDistributors.fi Sacred Rhythms  [phone] https://[email or web address]-[email or web address]/ [employer] (PADA) [email or web address] https://www.[email or web address]/ [place] Birth [facility] Baby    http://[email or web address]/ Considering Waterbirth? Guide for patients at [facility] [employer]  Why consider waterbirth?  . Gentle birth for babies . Less pain medicine used in labor . May allow for passive descent/less pushing . May reduce perineal tears  . More mobility and instinctive maternal position changes . Increased maternal relaxation . Reduced blood pressure in labor  Is waterbirth safe? What are the risks of infection, drowning or other complications?  . Infection: o Very low risk (3.7 % for tub vs 4.8% for bed) o 7 in 8000 waterbirths with documented infection o Poorly cleaned equipment most common cause o Slightly lower group B strep transmission rate  . Drowning o Maternal:  - Very low risk   - Related to seizures or fainting o Newborn:  - Very low risk. No evidence of increased risk of respiratory problems in multiple large studies - Physiological protection from breathing under water - Avoid underwater birth if there are any fetal complications - Once baby's head is out of the water, keep it out.  . Birth complication o Some reports of cord trauma, but risk decreased by bringing baby to surface gradually o No evidence of increased risk of shoulder dystocia. Mothers can usually change positions faster in water than in a bed, possibly aiding the maneuvers to free the shoulder.   You must attend a Doren Custard class at [facility]  3rd Wednesday of every month from 7-9pm  [employer] by calling [phone] or online at [email or web address]  Bring [place] the certificate from the class to your prenatal appointment  Meet with a midwife at 36 weeks to see if you can still plan a waterbirth and to sign the consent.   Purchase or rent the following supplies:   Water Birth Pool (Birth Pool in a Box or [place] for instance)  (Tubs start ~$125)  Single-use disposable tub liner designed for your brand of tub  New garden hose labeled  "lead-free", "suitable for drinking water",  Electric drain pump to remove water (We recommend 792 gallon per hour or greater pump.)   Separate garden hose to remove the dirty water  Fish net  Bathing suit top (optional)  Long-handled mirror (optional)  Places to purchase or rent supplies  GotWebTools.is for tub purchases and supplies  Waterbirthsolutions.com for tub purchases and supplies  The Labor Ladies (www.thelaborladies.com) $275 for tub rental/set-up & take down/kit   [employer] (http://www.fleming.com/.htm) Information regarding doulas (labor support) who provide pool rentals  Our practice has a Birth Pool in a Box tub at the hospital that you may borrow on a first-come-first-served basis. It is your responsibility to to set up, clean and break down the tub. We cannot guarantee the availability of this tub in advance. You are responsible for bringing all accessories listed above. If you do not have all necessary supplies you cannot have a waterbirth.    Things that would prevent you from having a waterbirth:  Premature, <37wks  Previous cesarean birth  Presence of thick meconium-stained fluid  Multiple gestation (Twins, triplets, etc.)  Uncontrolled diabetes or gestational diabetes requiring medication  Hypertension requiring  medication or diagnosis of pre-eclampsia  Heavy vaginal bleeding  Non-reassuring fetal heart rate  Active infection (MRSA, etc.). Group B Strep is NOT a contraindication for  waterbirth.  If your labor has to be induced and induction method requires continuous  monitoring of the baby's heart rate  Other risks/issues identified by your obstetrical provider  Please remember that birth is unpredictable. Under certain unforeseeable circumstances your provider may advise against giving birth in the tub. These decisions will be made on a case-by-case basis and with the safety of you and your baby as our highest  priority.  First Trimester of Pregnancy The first trimester of pregnancy is from week 1 until the end of week 13 (months 1 through 3). During this time, your baby will begin to develop inside you. At 6-8 weeks, the eyes and face are formed, and the heartbeat can be seen on ultrasound. At the end of 12 weeks, all the baby's organs are formed. Prenatal care is all the medical care you receive before the birth of your baby. Make sure you get good prenatal care and follow all of your doctor's instructions. Follow these instructions at home: Medicines  Take over-the-counter and prescription medicines only as told by your doctor. Some medicines are safe and some medicines are not safe during pregnancy.  Take a prenatal vitamin that contains at least 600 micrograms (mcg) of folic acid.  If you have trouble pooping (constipation), take medicine that will make your stool soft (stool softener) if your doctor approves. Eating and drinking  Eat regular, healthy meals.  Your doctor will tell you the amount of weight gain that is right for you.  Avoid raw meat and uncooked cheese.  If you feel sick to your stomach (nauseous) or throw up (vomit): ? Eat 4 or 5 small meals a day instead of 3 large meals. ? Try eating a few soda crackers. ? Drink liquids between meals instead of during meals.  To prevent constipation: ? Eat foods that are high in fiber, like fresh fruits and vegetables, whole grains, and beans. ? Drink enough fluids to keep your pee (urine) clear or pale yellow. Activity  Exercise only as told by your doctor. Stop exercising if you have cramps or pain in your lower belly (abdomen) or low back.  Do not exercise if it is too hot, too humid, or if you are in a place of great height (high altitude).  Try to avoid standing for long periods of time. Move your legs often if you must stand in one place for a long time.  Avoid heavy lifting.  Wear low-heeled shoes. Sit and stand up  straight.  You can have sex unless your doctor tells you not to. Relieving pain and discomfort  Wear a good support bra if your breasts are sore.  Take warm water baths (sitz baths) to soothe pain or discomfort caused by hemorrhoids. Use hemorrhoid cream if your doctor says it is okay.  Rest with your legs raised if you have leg cramps or low back pain.  If you have puffy, bulging veins (varicose veins) in your legs: ? Wear support hose or compression stockings as told by your doctor. ? Raise (elevate) your feet for 15 minutes, 3-4 times a day. ? Limit salt in your food. Prenatal care  Schedule your prenatal visits by the twelfth week of pregnancy.  Write down your questions. Take them to your prenatal visits.  Keep all your prenatal visits as told by your doctor. This  is important. Safety  Wear your seat belt at all times when driving.  Make a list of emergency phone numbers. The list should include numbers for family, friends, the hospital, and police and fire departments. General instructions  Ask your doctor for a referral to a local prenatal class. Begin classes no later than at the start of month 6 of your pregnancy.  Ask for help if you need counseling or if you need help with nutrition. Your doctor can give you advice or tell you where to go for help.  Do not use hot tubs, steam rooms, or saunas.  Do not douche or use tampons or scented sanitary pads.  Do not cross your legs for long periods of time.  Avoid all herbs and alcohol. Avoid drugs that are not approved by your doctor.  Do not use any tobacco products, including cigarettes, chewing tobacco, and electronic cigarettes. If you need help quitting, ask your doctor. You may get counseling or other support to help you quit.  Avoid cat litter boxes and soil used by cats. These carry germs that can cause birth defects in the baby and can cause a loss of your baby (miscarriage) or stillbirth.  Visit your dentist.  At home, brush your teeth with a soft toothbrush. Be gentle when you floss. Contact a doctor if:  You are dizzy.  You have mild cramps or pressure in your lower belly.  You have a nagging pain in your belly area.  You continue to feel sick to your stomach, you throw up, or you have watery poop (diarrhea).  You have a bad smelling fluid coming from your vagina.  You have pain when you pee (urinate).  You have increased puffiness (swelling) in your face, hands, legs, or ankles. Get help right away if:  You have a fever.  You are leaking fluid from your vagina.  You have spotting or bleeding from your vagina.  You have very bad belly cramping or pain.  You gain or lose weight rapidly.  You throw up blood. It may look like coffee grounds.  You are around people who have [place] measles, fifth disease, or chickenpox.  You have a very bad headache.  You have shortness of breath.  You have any kind of trauma, such as from a fall or a car accident. Summary  The first trimester of pregnancy is from week 1 until the end of week 13 (months 1 through 3).  To take care of yourself and your unborn baby, you will need to eat healthy meals, take medicines only if your doctor tells you to do so, and do activities that are safe for you and your baby.  Keep all follow-up visits as told by your doctor. This is important as your doctor will have to ensure that your baby is healthy and growing well. This information is not intended to replace advice given to you by your health care provider. Make sure you discuss any questions you have with your health care provider. Document Released: 12/16/2007 Document Revised: 07/07/2016 Document Reviewed: 07/07/2016 Elsevier Interactive Patient Education  2017 [employer].

## 2017-07-22 NOTE — Progress Notes (Signed)
Pt declined Flu Shot

## 2017-07-22 NOTE — Progress Notes (Signed)
Subjective:   Colleen Fields is a 27 y.o. G2P1001 at [redacted]w[redacted]d by LMP being seen today for her first obstetrical visit.  Her obstetrical history is significant for obesity. Patient does intend to breast feed. Pregnancy history fully reviewed.  Patient reports no complaints.  HISTORY: Obstetric History   G2   P1   T1   P0   A0   L1    SAB0   TAB0   Ectopic0   Multiple0   Live Births0     # Outcome Date GA Lbr Len/2nd Weight Sex Delivery Anes PTL Lv  2 Current           1 Term      CS-Unspec        Past Medical History:  Diagnosis Date  . Depression   . Migraine    Past Surgical History:  Procedure Laterality Date  . CESAREAN SECTION    . WISDOM TOOTH EXTRACTION     Family History  Problem Relation Age of Onset  . Hypertension Mother    Social History   Tobacco Use  . Smoking status: Never Smoker  . Smokeless tobacco: Never Used  Substance Use Topics  . Alcohol use: No  . Drug use: No    Comment: "hasn't smoked in forever"   Allergies  Allergen Reactions  . Latex Itching and Other (See Comments)    Causes burning.   Current Outpatient Medications on File Prior to Visit  Medication Sig Dispense Refill  . aspirin-acetaminophen-caffeine (EXCEDRIN MIGRAINE) 250-250-65 MG tablet Take 2 tablets by mouth every 6 (six) hours as needed for headache.    . Prenatal MV-Min-FA-Omega-3 (PRENATAL GUMMIES/DHA & FA) 0.4-32.5 MG CHEW Chew 1 tablet by mouth daily. 30 tablet 12   No current facility-administered medications on file prior to visit.      Exam   Vitals:   07/22/17 1058 07/22/17 1118  BP: 112/74   Pulse: 87   Weight: 237 lb 1.6 oz (107.5 kg)   Height:  4\' 11"  (1.499 m)   Fetal Heart Rate (bpm): 158  Uterus:   above SP   Pelvic Exam: Deferred    System: General: well-developed, well-nourished female in no acute distress   Breast:  normal appearance, no masses or tenderness   Skin: normal coloration and turgor, no rashes   Neurologic: oriented, normal,  negative, normal mood   Extremities: normal strength, tone, and muscle mass, ROM of all joints is normal   HEENT PERRLA, extraocular movement intact and sclera clear   Mouth/Teeth mucous membranes moist, pharynx normal without lesions and dental hygiene good   Neck supple and no masses   Cardiovascular: regular rate and rhythm   Respiratory:  no respiratory distress, normal breath sounds   Abdomen: soft, non-tender; bowel sounds normal; no masses,  no organomegaly     Assessment:   Pregnancy: G2P1001 Patient Active Problem List   Diagnosis Date Noted  . Supervision of other normal pregnancy, antepartum 07/22/2017  . History of cesarean section, unknown scar 07/22/2017  . Obesity affecting pregnancy, antepartum 07/22/2017     Plan:  1. Supervision of other normal pregnancy, antepartum -Feels well with no complaints today. Patient states PAP was done in 2015 or 2016 and declined for it to be done today, records requested  - Obstetric Panel, Including HIV - Culture, OB Urine - SMN1 COPY NUMBER ANALYSIS (SMA Carrier Screen) - Hemoglobinopathy Evaluation - GC/Chlamydia probe amp ([place])not at [facility] - Enroll Patient in  Babyscripts  2. Obesity during pregnancy, antepartum -Discussed increased risk of preeclampsia with obesity and ethnicity. Educated on the recommended weight gain during pregnancy  -Early 2hrGTT at next visit, patient not fasting today  - aspirin EC 81 MG tablet; Take 1 tablet (81 mg total) by mouth daily. Take after 12 weeks for prevention of preeclampsia later in pregnancy  Dispense: 300 tablet; Refill: 2   3. History of cesarean section, unknown scar -Records requested  -Patient plans TOLAC, dependent on uterine incision, discussed TOLAC in detail with patient and answered questions    Initial labs drawn. Continue prenatal vitamins. Genetic Screening discussed, Quad screen: requested. Ultrasound discussed; fetal anatomic survey: requested. Problem list  reviewed and updated. The nature of [facility] [facility] with multiple MDs and other Advanced Practice Providers was explained to patient; also emphasized that residents, students are part of our team. Routine obstetric precautions reviewed. Return in about 4 weeks (around 08/19/2017) for ROB/early 2hr GTT.    Sharyon CableVeronica C Othell Diluzio, CNM 07/22/17, 12:03 PM

## 2017-07-23 ENCOUNTER — Encounter: Payer: Medicaid Other | Admitting: Obstetrics and Gynecology

## 2017-07-23 LAB — GC/CHLAMYDIA PROBE AMP (~~LOC~~) NOT AT ARMC
Chlamydia: NEGATIVE
Neisseria Gonorrhea: NEGATIVE

## 2017-07-26 LAB — OBSTETRIC PANEL, INCLUDING HIV
Antibody Screen: NEGATIVE
Basophils Absolute: 0 10*3/uL (ref 0.0–0.2)
Basos: 0 %
EOS (ABSOLUTE): 0.1 10*3/uL (ref 0.0–0.4)
Eos: 1 %
HIV Screen 4th Generation wRfx: NONREACTIVE
Hematocrit: 33.3 % — ABNORMAL LOW (ref 34.0–46.6)
Hemoglobin: 11 g/dL — ABNORMAL LOW (ref 11.1–15.9)
Hepatitis B Surface Ag: NEGATIVE
Immature Grans (Abs): 0 10*3/uL (ref 0.0–0.1)
Immature Granulocytes: 0 %
Lymphocytes Absolute: 1.7 10*3/uL (ref 0.7–3.1)
Lymphs: 32 %
MCH: 29.4 pg (ref 26.6–33.0)
MCHC: 33 g/dL (ref 31.5–35.7)
MCV: 89 fL (ref 79–97)
Monocytes Absolute: 0.4 10*3/uL (ref 0.1–0.9)
Monocytes: 7 %
Neutrophils Absolute: 3.2 10*3/uL (ref 1.4–7.0)
Neutrophils: 60 %
Platelets: 294 10*3/uL (ref 150–379)
RBC: 3.74 x10E6/uL — ABNORMAL LOW (ref 3.77–5.28)
RDW: 14.6 % (ref 12.3–15.4)
RPR Ser Ql: NONREACTIVE
Rh Factor: POSITIVE
Rubella Antibodies, IGG: 2.72 index (ref >0.99)
WBC: 5.4 10*3/uL (ref 3.4–10.8)

## 2017-07-26 LAB — HEMOGLOBINOPATHY EVALUATION
Ferritin: 84 ng/mL (ref 15–150)
Hgb A2 Quant: 2.3 % (ref 1.8–3.2)
Hgb A: 97 % (ref 96.4–98.8)
Hgb C: 0 %
Hgb F Quant: 0.7 % (ref 0.0–2.0)
Hgb S: 0 %
Hgb Solubility: NEGATIVE
Hgb Variant: 0 %

## 2017-07-27 ENCOUNTER — Encounter: Payer: Self-pay | Admitting: *Deleted

## 2017-07-28 LAB — URINE CULTURE, OB REFLEX

## 2017-07-28 LAB — CULTURE, OB URINE

## 2017-07-29 LAB — SMN1 COPY NUMBER ANALYSIS (SMA CARRIER SCREENING)

## 2017-08-19 ENCOUNTER — Encounter: Payer: Medicaid Other | Admitting: Obstetrics & Gynecology

## 2017-08-31 ENCOUNTER — Other Ambulatory Visit: Payer: Self-pay

## 2017-09-01 ENCOUNTER — Ambulatory Visit (INDEPENDENT_AMBULATORY_CARE_PROVIDER_SITE_OTHER): Payer: Medicaid Other | Admitting: Obstetrics & Gynecology

## 2017-09-01 ENCOUNTER — Other Ambulatory Visit: Payer: Medicaid Other

## 2017-09-01 ENCOUNTER — Encounter: Payer: Self-pay | Admitting: Obstetrics & Gynecology

## 2017-09-01 VITALS — BP 122/76 | HR 101 | Wt 238.5 lb

## 2017-09-01 DIAGNOSIS — Z3482 Encounter for supervision of other normal pregnancy, second trimester: Secondary | ICD-10-CM

## 2017-09-01 DIAGNOSIS — Z3480 Encounter for supervision of other normal pregnancy, unspecified trimester: Secondary | ICD-10-CM

## 2017-09-01 NOTE — Progress Notes (Signed)
   PRENATAL VISIT NOTE  Subjective:  Colleen Fields is a 27 y.o. G2P0101 at 5369w5d being seen today for ongoing prenatal care.  She is currently monitored for the following issues for this low-risk pregnancy and has Supervision of other normal pregnancy, antepartum; History of cesarean section, unknown scar; and Obesity affecting pregnancy, antepartum on their problem list.  Patient reports no complaints.  Contractions: Not present. Vag. Bleeding: None.  Movement: (!) Decreased. Denies leaking of fluid.   The following portions of the patient's history were reviewed and updated as appropriate: allergies, current medications, past family history, past medical history, past social history, past surgical history and problem list. Problem list updated.  Objective:   Vitals:   09/01/17 0910  BP: 122/76  Pulse: (!) 101  Weight: 238 lb 8 oz (108.2 kg)    Fetal Status: Fetal Heart Rate (bpm): 160   Movement: (!) Decreased     General:  Alert, oriented and cooperative. Patient is in no acute distress.  Skin: Skin is warm and dry. No rash noted.   Cardiovascular: Normal heart rate noted  Respiratory: Normal respiratory effort, no problems with respiration noted  Abdomen: Soft, gravid, appropriate for gestational age.  Pain/Pressure: Present     Pelvic: Cervical exam deferred        Extremities: Normal range of motion.  Edema: None  Mental Status:  Normal mood and affect. Normal behavior. Normal judgment and thought content.   Assessment and Plan:  Pregnancy: G2P0101 at 2669w5d  1. Encounter for supervision of other normal pregnancy in second trimester  - US MFM OB DETAIL +14 WK; Future  2. Supervision of other normal pregnancy, antepartum   Preterm labor symptoms and general obstetric precautions including but not limited to vaginal bleeding, contractions, leaking of fluid and fetal movement were reviewed in detail with the patient. Please refer to After Visit Summary for other counseling  recommendations.  Return in about 4 weeks (around 09/29/2017).  Need delivery record Scheryl DarterJames Demetreus Lothamer, MD

## 2017-09-01 NOTE — Patient Instructions (Signed)
Second Trimester of Pregnancy The second trimester is from week 13 through week 28, month 4 through 6. This is often the time in pregnancy that you feel your best. Often times, morning sickness has lessened or quit. You may have more energy, and you may get hungry more often. Your unborn baby (fetus) is growing rapidly. At the end of the sixth month, he or she is about 9 inches long and weighs about 1 pounds. You will likely feel the baby move (quickening) between 18 and 20 weeks of pregnancy. Follow these instructions at home:  Avoid all smoking, herbs, and alcohol. Avoid drugs not approved by your doctor.  Do not use any tobacco products, including cigarettes, chewing tobacco, and electronic cigarettes. If you need help quitting, ask your doctor. You may get counseling or other support to help you quit.  Only take medicine as told by your doctor. Some medicines are safe and some are not during pregnancy.  Exercise only as told by your doctor. Stop exercising if you start having cramps.  Eat regular, healthy meals.  Wear a good support bra if your breasts are tender.  Do not use hot tubs, steam rooms, or saunas.  Wear your seat belt when driving.  Avoid raw meat, uncooked cheese, and liter boxes and soil used by cats.  Take your prenatal vitamins.  Take 1500-2000 milligrams of calcium daily starting at the 20th week of pregnancy until you deliver your baby.  Try taking medicine that helps you poop (stool softener) as needed, and if your doctor approves. Eat more fiber by eating fresh fruit, vegetables, and whole grains. Drink enough fluids to keep your pee (urine) clear or pale yellow.  Take warm water baths (sitz baths) to soothe pain or discomfort caused by hemorrhoids. Use hemorrhoid cream if your doctor approves.  If you have puffy, bulging veins (varicose veins), wear support hose. Raise (elevate) your feet for 15 minutes, 3-4 times a day. Limit salt in your diet.  Avoid heavy  lifting, wear low heals, and sit up straight.  Rest with your legs raised if you have leg cramps or low back pain.  Visit your dentist if you have not gone during your pregnancy. Use a soft toothbrush to brush your teeth. Be gentle when you floss.  You can have sex (intercourse) unless your doctor tells you not to.  Go to your doctor visits. Get help if:  You feel dizzy.  You have mild cramps or pressure in your lower belly (abdomen).  You have a nagging pain in your belly area.  You continue to feel sick to your stomach (nauseous), throw up (vomit), or have watery poop (diarrhea).  You have bad smelling fluid coming from your vagina.  You have pain with peeing (urination). Get help right away if:  You have a fever.  You are leaking fluid from your vagina.  You have spotting or bleeding from your vagina.  You have severe belly cramping or pain.  You lose or gain weight rapidly.  You have trouble catching your breath and have chest pain.  You notice sudden or extreme puffiness (swelling) of your face, hands, ankles, feet, or legs.  You have not felt the baby move in over an hour.  You have severe headaches that do not go away with medicine.  You have vision changes. This information is not intended to replace advice given to you by your health care provider. Make sure you discuss any questions you have with your health care   provider. Document Released: 09/23/2009 Document Revised: 12/05/2015 Document Reviewed: 08/30/2012 Elsevier Interactive Patient Education  2017 Elsevier Inc.  

## 2017-09-02 LAB — GLUCOSE TOLERANCE, 2 HOURS W/ 1HR
Glucose, 1 hour: 165 mg/dL (ref 65–179)
Glucose, 2 hour: 128 mg/dL (ref 65–152)
Glucose, Fasting: 109 mg/dL — ABNORMAL HIGH (ref 65–91)

## 2017-09-05 ENCOUNTER — Other Ambulatory Visit: Payer: Self-pay | Admitting: Advanced Practice Midwife

## 2017-09-05 DIAGNOSIS — Z8632 Personal history of gestational diabetes: Secondary | ICD-10-CM

## 2017-09-05 DIAGNOSIS — O24419 Gestational diabetes mellitus in pregnancy, unspecified control: Secondary | ICD-10-CM

## 2017-09-05 HISTORY — DX: Personal history of gestational diabetes: Z86.32

## 2017-09-07 ENCOUNTER — Telehealth: Payer: Self-pay | Admitting: *Deleted

## 2017-09-07 NOTE — Telephone Encounter (Signed)
I called Colleen Fields and spoke with a female who said she is her Mom. I asked her to have Colleen Fields to call our office about non- urgent information asap.  I will also send message to front office to schedule for GDM education asap.

## 2017-09-07 NOTE — Telephone Encounter (Signed)
-----   Message from Armando ReichertHeather D Hogan, CNM sent at 09/05/2017  1:00 PM EST ----- Patient has GDM. Please set up with diabetes educator.  Heather Hogan 1:00 PM 09/05/17

## 2017-09-09 ENCOUNTER — Other Ambulatory Visit: Payer: Medicaid Other

## 2017-09-10 ENCOUNTER — Telehealth: Payer: Self-pay | Admitting: General Practice

## 2017-09-10 NOTE — Telephone Encounter (Signed)
Called patient to schedule with our Diabetes Educator.  Patient stated that she need to check her schedule for scheduling appointment.  She stated that she will be giving our office a call to schedule.

## 2017-09-10 NOTE — Telephone Encounter (Signed)
-----   Message from Gerome ApleyLinda L Zeyfang, RN sent at 09/07/2017  1:43 PM EST ----- Regarding: GDM education appt Please schedule for GDM appt for this Thursday 2/28 if possible. We are trying to reach the patient- so if we have not spoken with her ; just schedule and we will tell her when we talk to her. If we have already spoken with her- please call her with appt Bonita QuinLinda

## 2017-09-16 ENCOUNTER — Ambulatory Visit (HOSPITAL_COMMUNITY)
Admission: RE | Admit: 2017-09-16 | Discharge: 2017-09-16 | Disposition: A | Discharge status: Home / Self Care | Payer: Medicaid Other | Source: Ambulatory Visit | Attending: Obstetrics & Gynecology | Admitting: Obstetrics & Gynecology

## 2017-09-16 ENCOUNTER — Encounter (HOSPITAL_COMMUNITY): Payer: Self-pay

## 2017-09-16 ENCOUNTER — Other Ambulatory Visit: Payer: Self-pay | Admitting: Obstetrics & Gynecology

## 2017-09-16 DIAGNOSIS — E669 Obesity, unspecified: Secondary | ICD-10-CM | POA: Diagnosis not present

## 2017-09-16 DIAGNOSIS — O09899 Supervision of other high risk pregnancies, unspecified trimester: Secondary | ICD-10-CM

## 2017-09-16 DIAGNOSIS — O24419 Gestational diabetes mellitus in pregnancy, unspecified control: Secondary | ICD-10-CM | POA: Diagnosis not present

## 2017-09-16 DIAGNOSIS — O99212 Obesity complicating pregnancy, second trimester: Secondary | ICD-10-CM

## 2017-09-16 DIAGNOSIS — Z3689 Encounter for other specified antenatal screening: Secondary | ICD-10-CM | POA: Diagnosis not present

## 2017-09-16 DIAGNOSIS — Z3482 Encounter for supervision of other normal pregnancy, second trimester: Secondary | ICD-10-CM

## 2017-09-16 DIAGNOSIS — Z98891 History of uterine scar from previous surgery: Secondary | ICD-10-CM

## 2017-09-16 DIAGNOSIS — O09219 Supervision of pregnancy with history of pre-term labor, unspecified trimester: Secondary | ICD-10-CM

## 2017-09-16 DIAGNOSIS — O09212 Supervision of pregnancy with history of pre-term labor, second trimester: Secondary | ICD-10-CM | POA: Diagnosis not present

## 2017-09-16 DIAGNOSIS — Z3A20 20 weeks gestation of pregnancy: Secondary | ICD-10-CM

## 2017-09-16 DIAGNOSIS — Z3480 Encounter for supervision of other normal pregnancy, unspecified trimester: Secondary | ICD-10-CM

## 2017-09-16 DIAGNOSIS — O34219 Maternal care for unspecified type scar from previous cesarean delivery: Secondary | ICD-10-CM

## 2017-09-16 DIAGNOSIS — O99210 Obesity complicating pregnancy, unspecified trimester: Secondary | ICD-10-CM

## 2017-09-17 ENCOUNTER — Other Ambulatory Visit (HOSPITAL_COMMUNITY): Payer: Self-pay | Admitting: *Deleted

## 2017-09-17 DIAGNOSIS — Z362 Encounter for other antenatal screening follow-up: Secondary | ICD-10-CM

## 2017-09-17 NOTE — Addendum Note (Signed)
Encounter addended by: Adam PhenixArnold, James G, MD on: 09/17/2017 12:27 PM  Actions taken: Problem List modified

## 2017-09-23 ENCOUNTER — Telehealth: Payer: Self-pay | Admitting: Obstetrics & Gynecology

## 2017-09-23 NOTE — Telephone Encounter (Signed)
Called patient about her appointments. Called both numbers listed in Epic, and was not able to reach her. Sent appointment via mail.

## 2017-09-23 NOTE — Telephone Encounter (Signed)
Called pt's mobile # and left message stating that I am calling with important information (non emergent). Please call back and leave a message stating whether detailed information can be left on her voice mail. Pt needs next Ob appt scheduled as well as Education appt for new Dx Gestational Diabetes. Message sent to front office to schedule appts.

## 2017-09-27 ENCOUNTER — Encounter: Payer: Medicaid Other | Admitting: Family Medicine

## 2017-09-27 ENCOUNTER — Telehealth: Payer: Self-pay | Admitting: Family Medicine

## 2017-09-27 NOTE — Telephone Encounter (Signed)
Left message about missed appt and new appt scheduled

## 2017-09-28 ENCOUNTER — Other Ambulatory Visit: Payer: Medicaid Other

## 2017-09-30 ENCOUNTER — Encounter: Payer: Medicaid Other | Admitting: Obstetrics & Gynecology

## 2017-09-30 NOTE — Telephone Encounter (Signed)
Patient has appointment schedule for 09/30/2017

## 2017-10-01 ENCOUNTER — Encounter: Payer: Self-pay | Admitting: *Deleted

## 2017-10-01 NOTE — Telephone Encounter (Signed)
Called home and mobile numbers and left message trying to reach you re: missed appointments and labs results- please call. Will also send letter. Alsomissed 09/30/17 ob fu.

## 2017-10-05 ENCOUNTER — Telehealth: Payer: Self-pay | Admitting: General Practice

## 2017-10-05 ENCOUNTER — Telehealth: Payer: Self-pay | Admitting: *Deleted

## 2017-10-05 NOTE — Telephone Encounter (Signed)
Patient called and left message on voicemail line stating she is returning a phone call to Diane. Patient states a message can be left on her voicemail. Per chart review, patient has missed appts and we have been trying to reach her to reschedule. Called patient at [phone] and left message explaining she has missed several important appts that our front office staff is trying to reach her to reschedule, please contact them to reschedule missed appts.

## 2017-10-05 NOTE — Telephone Encounter (Signed)
Patient left message, stated she has called the appointment line with no success. She has received multiple phone calls about missed appointments but "I havent made any appointments except for my ultrasounds". Not sure why we keep calling. Her days off are March 28 and 29, April 3, 18, 19, and 24. We may schedule her on any of those days and let her know.

## 2017-10-08 ENCOUNTER — Telehealth: Payer: Self-pay | Admitting: General Practice

## 2017-10-08 NOTE — Telephone Encounter (Signed)
Called patient to schedule f/u appointment.  Patient has missed several appts.  Patient voiced understanding.

## 2017-10-13 ENCOUNTER — Other Ambulatory Visit (HOSPITAL_COMMUNITY): Payer: Self-pay | Admitting: *Deleted

## 2017-10-13 ENCOUNTER — Other Ambulatory Visit (HOSPITAL_COMMUNITY): Payer: Self-pay | Admitting: Obstetrics and Gynecology

## 2017-10-13 ENCOUNTER — Encounter (HOSPITAL_COMMUNITY): Payer: Self-pay

## 2017-10-13 ENCOUNTER — Ambulatory Visit (HOSPITAL_COMMUNITY)
Admission: RE | Admit: 2017-10-13 | Discharge: 2017-10-13 | Disposition: A | Discharge status: Home / Self Care | Payer: Medicaid Other | Source: Ambulatory Visit | Attending: Obstetrics & Gynecology | Admitting: Obstetrics & Gynecology

## 2017-10-13 DIAGNOSIS — Z3A24 24 weeks gestation of pregnancy: Secondary | ICD-10-CM | POA: Diagnosis not present

## 2017-10-13 DIAGNOSIS — O34219 Maternal care for unspecified type scar from previous cesarean delivery: Secondary | ICD-10-CM | POA: Diagnosis not present

## 2017-10-13 DIAGNOSIS — O99212 Obesity complicating pregnancy, second trimester: Secondary | ICD-10-CM | POA: Insufficient documentation

## 2017-10-13 DIAGNOSIS — O09292 Supervision of pregnancy with other poor reproductive or obstetric history, second trimester: Secondary | ICD-10-CM

## 2017-10-13 DIAGNOSIS — Z362 Encounter for other antenatal screening follow-up: Secondary | ICD-10-CM | POA: Diagnosis not present

## 2017-10-13 DIAGNOSIS — Z0489 Encounter for examination and observation for other specified reasons: Secondary | ICD-10-CM

## 2017-10-13 DIAGNOSIS — O09212 Supervision of pregnancy with history of pre-term labor, second trimester: Secondary | ICD-10-CM | POA: Insufficient documentation

## 2017-10-13 DIAGNOSIS — IMO0002 Reserved for concepts with insufficient information to code with codable children: Secondary | ICD-10-CM

## 2017-10-13 DIAGNOSIS — O24410 Gestational diabetes mellitus in pregnancy, diet controlled: Secondary | ICD-10-CM

## 2017-10-13 DIAGNOSIS — O24419 Gestational diabetes mellitus in pregnancy, unspecified control: Secondary | ICD-10-CM

## 2017-10-13 DIAGNOSIS — Z3689 Encounter for other specified antenatal screening: Secondary | ICD-10-CM | POA: Diagnosis not present

## 2017-10-28 ENCOUNTER — Encounter: Payer: Medicaid Other | Attending: Obstetrics & Gynecology | Admitting: *Deleted

## 2017-10-28 ENCOUNTER — Ambulatory Visit: Payer: Medicaid Other | Admitting: *Deleted

## 2017-10-28 ENCOUNTER — Ambulatory Visit (INDEPENDENT_AMBULATORY_CARE_PROVIDER_SITE_OTHER): Payer: Medicaid Other | Admitting: Obstetrics and Gynecology

## 2017-10-28 ENCOUNTER — Encounter: Payer: Self-pay | Admitting: Obstetrics and Gynecology

## 2017-10-28 VITALS — BP 111/88 | HR 117 | Wt 239.0 lb

## 2017-10-28 DIAGNOSIS — Z98891 History of uterine scar from previous surgery: Secondary | ICD-10-CM

## 2017-10-28 DIAGNOSIS — O99210 Obesity complicating pregnancy, unspecified trimester: Secondary | ICD-10-CM

## 2017-10-28 DIAGNOSIS — Z3480 Encounter for supervision of other normal pregnancy, unspecified trimester: Secondary | ICD-10-CM

## 2017-10-28 DIAGNOSIS — O24419 Gestational diabetes mellitus in pregnancy, unspecified control: Secondary | ICD-10-CM

## 2017-10-28 DIAGNOSIS — Z029 Encounter for administrative examinations, unspecified: Secondary | ICD-10-CM

## 2017-10-28 MED ORDER — GLUCOSE BLOOD VI STRP: ORAL_STRIP | 12 refills | Status: DC

## 2017-10-28 MED ORDER — ACCU-CHEK GUIDE ME W/DEVICE KIT: 1.0000 | PACK | Freq: Once | 0 refills | Status: AC

## 2017-10-28 MED ORDER — ACCU-CHEK FASTCLIX LANCETS MISC: 1.0000 | Freq: Four times a day (QID) | 12 refills | Status: DC

## 2017-10-28 NOTE — Progress Notes (Signed)
   PRENATAL VISIT NOTE  Subjective:  Colleen Fields is a 27 y.o. G2P0101 at 6271w6d being seen today for ongoing prenatal care.  She is currently monitored for the following issues for this high-risk pregnancy and has Supervision of other normal pregnancy, antepartum; History of cesarean section, unknown scar; Obesity affecting pregnancy, antepartum; and Gestational diabetes on their problem list.  Patient reports no complaints.  Contractions: Not present. Vag. Bleeding: None.  Movement: Present. Denies leaking of fluid.   The following portions of the patient's history were reviewed and updated as appropriate: allergies, current medications, past family history, past medical history, past social history, past surgical history and problem list. Problem list updated.  Objective:   Vitals:   10/28/17 1323  BP: 111/88  Pulse: (!) 117  Weight: 239 lb (108.4 kg)    Fetal Status: Fetal Heart Rate (bpm): 153 Fundal Height: 27 cm Movement: Present     General:  Alert, oriented and cooperative. Patient is in no acute distress.  Skin: Skin is warm and dry. No rash noted.   Cardiovascular: Normal heart rate noted  Respiratory: Normal respiratory effort, no problems with respiration noted  Abdomen: Soft, gravid, appropriate for gestational age.  Pain/Pressure: Present     Pelvic: Cervical exam deferred        Extremities: Normal range of motion.  Edema: None  Mental Status: Normal mood and affect. Normal behavior. Normal judgment and thought content.   Assessment and Plan:  Pregnancy: G2P0101 at 4871w6d  1. Supervision of other normal pregnancy, antepartum Patient is doing well without complaints Third trimester labs today Patient declined tdap - CBC - HIV antibody - RPR - Tdap vaccine greater than or equal to 7yo IM  2. Gestational diabetes mellitus (GDM) in third trimester, gestational diabetes method of control unspecified Patient states that she was unaware of seriousness of the  condition She missed several diabetes education appointment Patient has not been checking CBGs Patient to meet with Meriam SpragueBeverly today Growth ultrasound scheduled 5/9 Fetal echo ordered - US Fetal Echocardiography; Future  3. History of cesarean section, unknown scar Records requested  4. Obesity affecting pregnancy, antepartum   Preterm labor symptoms and general obstetric precautions including but not limited to vaginal bleeding, contractions, leaking of fluid and fetal movement were reviewed in detail with the patient. Please refer to After Visit Summary for other counseling recommendations.  Return in about 3 weeks (around 11/18/2017) for ROB.  Future Appointments  Date Time Provider Department Center  11/18/2017  8:45 AM [facility] US 2 [facility]    Catalina AntiguaPeggy Malon Siddall, MD

## 2017-10-28 NOTE — Progress Notes (Signed)
Scheduled fetal echo with Dr Elizebeth Brookingotton 5/28 @ 1:30

## 2017-10-28 NOTE — Progress Notes (Signed)
Patient reports severe headaches when she wakes up in the morning, sometimes it goes away/sometimes it doesn't

## 2017-10-28 NOTE — Addendum Note (Signed)
Addended by: Kathee DeltonHILLMAN, CARRIE L on: 10/28/2017 03:55 PM   Modules accepted: Orders

## 2017-10-28 NOTE — Progress Notes (Signed)
  Patient was seen on 10/28/2017 for Gestational Diabetes self-management. EDD 01/28/2018. Patient states no history of GDM. Diet history obtained. Patient eats good variety of all food groups and beverages include water with occasional juice at supper meal. She is drinking a Glucerna type shake for breakfast.  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 She states she walks every evening after work with her husband for about 30 minutes. The following learning objectives were met by the patient :   States the definition of Gestational Diabetes  States why dietary management is important in controlling blood glucose  Describes the effects of carbohydrates on blood glucose levels  Demonstrates ability to create a balanced meal plan  Demonstrates carbohydrate counting   States when to check blood glucose levels  Demonstrates proper blood glucose monitoring techniques  States the effect of stress and exercise on blood glucose levels  States the importance of limiting caffeine and abstaining from alcohol and smoking  Plan:  Aim for 3 Carb Choices per meal (45 grams) +/- 1 either way  Aim for 1-2 Carbs per snack Begin reading food labels for Total Carbohydrate of foods Consider  increasing your activity level by walking or other activity daily as tolerated Begin checking BG before breakfast and 2 hours after first bite of breakfast, lunch and dinner as directed by MD  Bring Log Book/Sheet to every medical appointment OR record BG into Babyscripts APP Patient was introduced to [employer] and plans to use as record of BG electronically  Take medication if directed by MD  Blood glucose monitor Rx called into pharmacy:  Accu Check Guide with Fast Clix drums Patient instructed to test pre breakfast and 2 hours each meal as directed by MD  Patient instructed to monitor glucose levels: FBS: 60 - 95 mg/dl 2 hour: <120 mg/dl  Patient received the following handouts:  Nutrition  Diabetes and Pregnancy  Carbohydrate Counting List  Patient will be seen for follow-up as needed.

## 2017-10-29 LAB — SYPHILIS: RPR W/REFLEX TO RPR TITER AND TREPONEMAL ANTIBODIES, TRADITIONAL SCREENING AND DIAGNOSIS ALGORITHM: RPR Ser Ql: NONREACTIVE

## 2017-10-29 LAB — CBC
Hematocrit: 29.9 % — ABNORMAL LOW (ref 34.0–46.6)
Hemoglobin: 10.1 g/dL — ABNORMAL LOW (ref 11.1–15.9)
MCH: 30.4 pg (ref 26.6–33.0)
MCHC: 33.8 g/dL (ref 31.5–35.7)
MCV: 90 fL (ref 79–97)
Platelets: 253 10*3/uL (ref 150–379)
RBC: 3.32 x10E6/uL — ABNORMAL LOW (ref 3.77–5.28)
RDW: 14.8 % (ref 12.3–15.4)
WBC: 5.1 10*3/uL (ref 3.4–10.8)

## 2017-10-29 LAB — HIV ANTIBODY (ROUTINE TESTING W REFLEX): HIV Screen 4th Generation wRfx: NONREACTIVE

## 2017-11-18 ENCOUNTER — Ambulatory Visit (HOSPITAL_COMMUNITY): Payer: Medicaid Other

## 2017-11-19 ENCOUNTER — Other Ambulatory Visit (HOSPITAL_COMMUNITY): Payer: Self-pay | Admitting: *Deleted

## 2017-11-19 ENCOUNTER — Ambulatory Visit (INDEPENDENT_AMBULATORY_CARE_PROVIDER_SITE_OTHER): Payer: Medicaid Other | Admitting: Family Medicine

## 2017-11-19 ENCOUNTER — Encounter (HOSPITAL_COMMUNITY): Payer: Self-pay

## 2017-11-19 ENCOUNTER — Other Ambulatory Visit (HOSPITAL_COMMUNITY): Payer: Self-pay | Admitting: Obstetrics and Gynecology

## 2017-11-19 ENCOUNTER — Encounter: Payer: Self-pay | Admitting: Family Medicine

## 2017-11-19 ENCOUNTER — Ambulatory Visit (HOSPITAL_COMMUNITY)
Admission: RE | Admit: 2017-11-19 | Discharge: 2017-11-19 | Disposition: A | Discharge status: Home / Self Care | Payer: Medicaid Other | Source: Ambulatory Visit | Attending: Obstetrics & Gynecology | Admitting: Obstetrics & Gynecology

## 2017-11-19 VITALS — BP 107/54 | HR 94 | Wt 237.3 lb

## 2017-11-19 DIAGNOSIS — O99213 Obesity complicating pregnancy, third trimester: Secondary | ICD-10-CM | POA: Insufficient documentation

## 2017-11-19 DIAGNOSIS — O09213 Supervision of pregnancy with history of pre-term labor, third trimester: Secondary | ICD-10-CM

## 2017-11-19 DIAGNOSIS — O34219 Maternal care for unspecified type scar from previous cesarean delivery: Secondary | ICD-10-CM

## 2017-11-19 DIAGNOSIS — Z3A30 30 weeks gestation of pregnancy: Secondary | ICD-10-CM | POA: Diagnosis not present

## 2017-11-19 DIAGNOSIS — Z362 Encounter for other antenatal screening follow-up: Secondary | ICD-10-CM

## 2017-11-19 DIAGNOSIS — O24410 Gestational diabetes mellitus in pregnancy, diet controlled: Secondary | ICD-10-CM

## 2017-11-19 DIAGNOSIS — Z3480 Encounter for supervision of other normal pregnancy, unspecified trimester: Secondary | ICD-10-CM

## 2017-11-19 DIAGNOSIS — Z98891 History of uterine scar from previous surgery: Secondary | ICD-10-CM

## 2017-11-19 NOTE — Progress Notes (Signed)
   PRENATAL VISIT NOTE  Subjective:  Colleen Fields is a 27 y.o. G2P0101 at [redacted]w[redacted]d being seen today for ongoing prenatal care.  She is currently monitored for the following issues for this high-risk pregnancy and has Supervision of other normal pregnancy, antepartum; History of cesarean section, unknown scar; Obesity affecting pregnancy, antepartum; and Gestational diabetes on their problem list.  Patient reports no complaints.  Contractions: Not present. Vag. Bleeding: None.  Movement: Present. Denies leaking of fluid.   The following portions of the patient's history were reviewed and updated as appropriate: allergies, current medications, past family history, past medical history, past social history, past surgical history and problem list. Problem list updated.  Objective:   Vitals:   11/19/17 1033  BP: (!) 107/54  Pulse: 94  Weight: 237 lb 4.8 oz (107.6 kg)    Fetal Status: Fetal Heart Rate (bpm): 153   Movement: Present     General:  Alert, oriented and cooperative. Patient is in no acute distress.  Skin: Skin is warm and dry. No rash noted.   Cardiovascular: Normal heart rate noted  Respiratory: Normal respiratory effort, no problems with respiration noted  Abdomen: Soft, gravid, appropriate for gestational age.  Pain/Pressure: Present     Pelvic: Cervical exam deferred        Extremities: Normal range of motion.  Edema: Trace  Mental Status: Normal mood and affect. Normal behavior. Normal judgment and thought content.  FBS 124--Add bedtime snack with protein 2 hour 108-121 Assessment and Plan:  Pregnancy: G2P0101 at [redacted]w[redacted]d  1. History of cesarean section, unknown scar Likely PLTCS given she was vtx and close to term 35-36 wks.  2. Diet controlled gestational diabetes mellitus (GDM) in third trimester Add bedtime snack with protein May need insulin or metformin if cannot get FBS under control Add exercise  3. Supervision of other normal pregnancy,  antepartum   Preterm labor symptoms and general obstetric precautions including but not limited to vaginal bleeding, contractions, leaking of fluid and fetal movement were reviewed in detail with the patient. Please refer to After Visit Summary for other counseling recommendations.  Return in 1 week (on 11/26/2017).  Future Appointments  Date Time Provider Department Center  12/09/2017  2:15 PM Willodean Rosenthal, MD [facility]  12/31/2017  8:30 AM [facility] [place] 1 [facility]    Reva Bores, MD

## 2017-11-19 NOTE — Progress Notes (Signed)
Patient states that elevated depression screen is due to pregnancy.

## 2017-11-19 NOTE — Patient Instructions (Signed)

## 2017-12-09 ENCOUNTER — Ambulatory Visit (INDEPENDENT_AMBULATORY_CARE_PROVIDER_SITE_OTHER): Payer: Medicaid Other | Admitting: Clinical

## 2017-12-09 ENCOUNTER — Ambulatory Visit (INDEPENDENT_AMBULATORY_CARE_PROVIDER_SITE_OTHER): Payer: Medicaid Other | Admitting: Obstetrics & Gynecology

## 2017-12-09 VITALS — BP 128/57 | HR 122 | Wt 236.0 lb

## 2017-12-09 DIAGNOSIS — Z3480 Encounter for supervision of other normal pregnancy, unspecified trimester: Secondary | ICD-10-CM

## 2017-12-09 DIAGNOSIS — O24410 Gestational diabetes mellitus in pregnancy, diet controlled: Secondary | ICD-10-CM

## 2017-12-09 DIAGNOSIS — F32A Depression, unspecified: Secondary | ICD-10-CM | POA: Insufficient documentation

## 2017-12-09 DIAGNOSIS — F419 Anxiety disorder, unspecified: Secondary | ICD-10-CM

## 2017-12-09 DIAGNOSIS — F329 Major depressive disorder, single episode, unspecified: Secondary | ICD-10-CM | POA: Insufficient documentation

## 2017-12-09 DIAGNOSIS — O99210 Obesity complicating pregnancy, unspecified trimester: Secondary | ICD-10-CM

## 2017-12-09 DIAGNOSIS — O99213 Obesity complicating pregnancy, third trimester: Secondary | ICD-10-CM

## 2017-12-09 DIAGNOSIS — Z1331 Encounter for screening for depression: Secondary | ICD-10-CM

## 2017-12-09 DIAGNOSIS — Z98891 History of uterine scar from previous surgery: Secondary | ICD-10-CM

## 2017-12-09 DIAGNOSIS — F331 Major depressive disorder, recurrent, moderate: Secondary | ICD-10-CM | POA: Diagnosis not present

## 2017-12-09 HISTORY — DX: Depression, unspecified: F32.A

## 2017-12-09 LAB — POCT GLUCOSE (DEVICE FOR HOME USE): POC Glucose: 118 mg/dl — AB (ref 70–99)

## 2017-12-09 NOTE — BH Specialist Note (Signed)
Integrated Behavioral Health Initial Visit  MRN: [id number] Name: Colleen Fields  Number of Integrated Behavioral Health Clinician visits:: 1/6 Session Start time: 3:09 Session End time: 3:41 Total time: 30 minutes  Type of Service: Integrated Behavioral Health- Individual/Family Interpretor:No. Interpretor Name and Language: n/a   Warm Hand Off Completed.       SUBJECTIVE: Colleen Fields is a 27 y.o. female accompanied by n/a Patient [facility] and anxiety. Patient reports the following symptoms/concerns: Pt states her primary concern today is lack of concentration, fatigue, depression, excessive worry, anxiety, and lack of motivation, with migraine and sleep difficulty, attributed to balancing job and life stress during pregnancy. Pt also worries about her father(he experiences bipolar affective disorder and schizophrenia in [place]). Pt was on Prozac in the past for migraines and depression, but prefers non-medicinal methods of coping with symptoms.  Duration of problem: Current pregnancy; Severity of problem: severe  OBJECTIVE: Mood: Anxious and Depressed and Affect: Appropriate Risk of harm to self or others: No plan to harm self or others  LIFE CONTEXT: Family and Social: Pt lives with her husband and 9yo daughter(both very supportive); father in [place] experiences bipolar affective disorder and schizophrenia. Pt's mother(in [place]) plans to come to [facility] in June through postpartum to help School/Work: Working fulltime Self-Care: Reading Bible on phone Life Changes: Current pregnancy  GOALS ADDRESSED: Patient will: 1. Reduce symptoms of: anxiety, depression and stress 2. Increase knowledge and/or ability of: self-management skills and stress reduction  3. Demonstrate ability to: Increase healthy adjustment to current life circumstances and Increase adequate support systems for patient/family  INTERVENTIONS: Interventions utilized:  Mindfulness or Management consultant, Psychoeducation and/or Health Education and Link to [employer]  Standardized Assessments completed: GAD-7 and PHQ 9  ASSESSMENT: Patient currently experiencing Major depressive disorder, recurrent, moderate.   Patient may benefit from psychoeducation and brief therapeutic interventions regarding coping with symptoms of depression and anxiety .  PLAN: 1. Follow up with behavioral health clinician on : One month 2. Behavioral recommendations:  -CALM relaxation breathing exercise twice daily(morning; at bedtime) -Continue reading Bible verses on phone when stress levels rise(if phone is unavailable, practice relaxation breathing for at least one minute) -Continue taking prenatal vitamins and iron, as recommended by medical provider -Consider apps for additional self-coping -Read educational materials regarding coping with symptoms of depression and anxiety  3. Referral(s): Integrated Art gallery manager (In Clinic) and Community Resources:  [facility] Family-to-Family classes and support 4. "From scale of 1-10, how likely are you to follow plan?": 9  Rae Lips, LCSW  Depression screen [facility] 2/9 11/19/2017 07/22/2017  Decreased Interest 3 1  Down, Depressed, Hopeless 3 0  PHQ - 2 Score 6 1  Altered sleeping 3 3  Tired, decreased energy 3 3  Change in appetite 3 2  Feeling bad or failure about yourself  1 0  Trouble concentrating 0 2  Moving slowly or fidgety/restless 3 0  Suicidal thoughts 0 0  PHQ-9 Score 19 11   GAD 7 : Generalized Anxiety Score 11/19/2017 07/22/2017  Nervous, Anxious, on Edge 3 2  Control/stop worrying 3 1  Worry too much - different things 3 0  Trouble relaxing 3 0  Restless 0 0  Easily annoyed or irritable 3 1  Afraid - awful might happen 3 0  Total GAD 7 Score 18 4

## 2017-12-09 NOTE — Progress Notes (Signed)
   PRENATAL VISIT NOTE  Subjective:  Colleen Fields is a 27 y.o. G2P0101 at [redacted]w[redacted]d being seen today for ongoing prenatal care.  She is currently monitored for the following issues for this high-risk pregnancy and has Supervision of other normal pregnancy, antepartum; History of cesarean section, unknown scar; Obesity affecting pregnancy, antepartum; and Gestational diabetes on their problem list.  Patient reports no complaints.  Contractions: Not present. Vag. Bleeding: None.  Movement: Present. Denies leaking of fluid.   The following portions of the patient's history were reviewed and updated as appropriate: allergies, current medications, past family history, past medical history, past social history, past surgical history and problem list. Problem list updated.  Objective:   Vitals:   12/09/17 1430  BP: (!) 128/57  Pulse: (!) 122  Weight: 236 lb (107 kg)    Fetal Status: Fetal Heart Rate (bpm): 163   Movement: Present     General:  Alert, oriented and cooperative. Patient is in no acute distress.  Skin: Skin is warm and dry. No rash noted.   Cardiovascular: Normal heart rate noted  Respiratory: Normal respiratory effort, no problems with respiration noted  Abdomen: Soft, gravid, appropriate for gestational age.  Pain/Pressure: Present     Pelvic: Cervical exam deferred        Extremities: Normal range of motion.  Edema: Trace  Mental Status: Normal mood and affect. Normal behavior. Normal judgment and thought content.   Assessment and Plan:  Pregnancy: G2P0101 at [redacted]w[redacted]d  1. Diet controlled gestational diabetes mellitus (GDM) in third trimester  - POCT Glucose (Device for Home Use) Random glc 118 Has not been checking glucose for the last 2 weeks  ECHO 12/10/2017  2. Supervision of other normal pregnancy, antepartum  12/31/2017 f/u US  Declined Tdap  3. Obesity affecting pregnancy, antepartum  12/31/2017 f/u US  4. History of cesarean section, unknown scar  TOLAC form  signed today    5. Depression ante  PHQ9 is elevated Referred to behavioral health today .  Preterm labor symptoms and general obstetric precautions including but not limited to vaginal bleeding, contractions, leaking of fluid and fetal movement were reviewed in detail with the patient. Please refer to After Visit Summary for other counseling recommendations.  Return in about 2 weeks (around 12/23/2017).  Future Appointments  Date Time Provider Department Center  12/31/2017  8:30 AM [facility]-M[facility] [place] 1 WH-MFCUS MFC-US    Willodean Rosenthal, MD

## 2017-12-10 DIAGNOSIS — O24419 Gestational diabetes mellitus in pregnancy, unspecified control: Secondary | ICD-10-CM | POA: Diagnosis not present

## 2017-12-17 ENCOUNTER — Encounter: Payer: Self-pay | Admitting: *Deleted

## 2017-12-17 LAB — INFORMED CONSENT NEEDED

## 2017-12-20 ENCOUNTER — Ambulatory Visit (INDEPENDENT_AMBULATORY_CARE_PROVIDER_SITE_OTHER): Payer: Medicaid Other

## 2017-12-20 VITALS — BP 124/71 | HR 117 | Wt 236.8 lb

## 2017-12-20 DIAGNOSIS — Z3483 Encounter for supervision of other normal pregnancy, third trimester: Secondary | ICD-10-CM

## 2017-12-20 DIAGNOSIS — M79672 Pain in left foot: Secondary | ICD-10-CM

## 2017-12-20 DIAGNOSIS — Z3480 Encounter for supervision of other normal pregnancy, unspecified trimester: Secondary | ICD-10-CM

## 2017-12-20 DIAGNOSIS — O24410 Gestational diabetes mellitus in pregnancy, diet controlled: Secondary | ICD-10-CM

## 2017-12-20 NOTE — Progress Notes (Signed)
   PRENATAL VISIT NOTE  Subjective:  Colleen Fields is a 27 y.o. G2P0101 at 3367w3d being seen today for ongoing prenatal care.  She is currently monitored for the following issues for this high-risk pregnancy and has Supervision of other normal pregnancy, antepartum; History of cesarean section, unknown scar; Obesity affecting pregnancy, antepartum; Gestational diabetes; and Depression affecting pregnancy, antepartum on their problem list.  Patient reports left foot pain after an injury at work.  Contractions: Not present. Vag. Bleeding: None.  Movement: Present. Denies leaking of fluid.   The following portions of the patient's history were reviewed and updated as appropriate: allergies, current medications, past family history, past medical history, past social history, past surgical history and problem list. Problem list updated.  Objective:   Vitals:   12/20/17 1918  BP: 124/71  Pulse: (!) 117  Weight: 236 lb 12.8 oz (107.4 kg)    Fetal Status: Fetal Heart Rate (bpm): 166 Fundal Height: 35 cm Movement: Present     General:  Alert, oriented and cooperative. Patient is in no acute distress.  Skin: Skin is warm and dry. No rash noted.   Cardiovascular: Normal heart rate noted  Respiratory: Normal respiratory effort, no problems with respiration noted  Abdomen: Soft, gravid, appropriate for gestational age.  Pain/Pressure: Present     Pelvic: Cervical exam deferred        Extremities: Normal range of motion.  Edema: None  Mental Status: Normal mood and affect. Normal behavior. Normal judgment and thought content.   Assessment and Plan:  Pregnancy: G2P0101 at 6267w3d  1. Supervision of other normal pregnancy, antepartum - No complaints. Routine care  2. Diet controlled gestational diabetes mellitus (GDM) in third trimester -Patient did not have log. Reporting lower CBGs because she is eating salads. -Lengthy discussion about importance of testing blood sugars, bringing log book and  keeping appointments. Discussed risks of uncontrolled DM in pregnancy including delayed fetal lung maturity, IUFD and shoulder dystocia possibly resulting brachial plexus palsy, brain damage, intrapartum death and extensive obstetric lacerations. Patient verbalizes understanding.  3. Left foot pain - Comfort measures reviewed. Patient requesting work note for 3 days off. Discussed not being able to write out of work for the rest of the pregnancy for this complaint.  - AMB referral to orthopedics  Preterm labor symptoms and general obstetric precautions including but not limited to vaginal bleeding, contractions, leaking of fluid and fetal movement were reviewed in detail with the patient. Please refer to After Visit Summary for other counseling recommendations.  Return in about 2 weeks (around 01/03/2018).  Future Appointments  Date Time Provider Department Center  12/31/2017  8:30 AM WH-MFC US 1 WH-MFCUS MFC-US  01/05/2018  1:55 PM [place] BingPickens, Charlie, MD [facility] [address]    [place] Hosie M [place], [place]  12/20/17 7:49 PM

## 2017-12-20 NOTE — Progress Notes (Signed)
In April at work I hurt my L foot. Came to [facility] and was seen. Foot pain gradually impoved. Last wk was on the steps and think I stepped wrong and L foot hurting since then. Have long walk to and from car and elevator out and having to take steps. "Don't think I can make it to work and getting worse and worse"

## 2017-12-31 ENCOUNTER — Encounter (HOSPITAL_COMMUNITY): Payer: Self-pay

## 2017-12-31 ENCOUNTER — Ambulatory Visit (INDEPENDENT_AMBULATORY_CARE_PROVIDER_SITE_OTHER): Payer: BLUE CROSS/BLUE SHIELD | Admitting: Physician Assistant

## 2017-12-31 ENCOUNTER — Encounter (INDEPENDENT_AMBULATORY_CARE_PROVIDER_SITE_OTHER): Payer: Self-pay | Admitting: Physician Assistant

## 2017-12-31 ENCOUNTER — Other Ambulatory Visit (HOSPITAL_COMMUNITY): Payer: Self-pay | Admitting: Maternal and Fetal Medicine

## 2017-12-31 ENCOUNTER — Ambulatory Visit (HOSPITAL_COMMUNITY)
Admission: RE | Admit: 2017-12-31 | Discharge: 2017-12-31 | Disposition: A | Discharge status: Home / Self Care | Payer: Medicaid Other | Source: Ambulatory Visit | Attending: Family Medicine | Admitting: Family Medicine

## 2017-12-31 DIAGNOSIS — O99210 Obesity complicating pregnancy, unspecified trimester: Secondary | ICD-10-CM

## 2017-12-31 DIAGNOSIS — O99340 Other mental disorders complicating pregnancy, unspecified trimester: Secondary | ICD-10-CM

## 2017-12-31 DIAGNOSIS — Z362 Encounter for other antenatal screening follow-up: Secondary | ICD-10-CM | POA: Insufficient documentation

## 2017-12-31 DIAGNOSIS — O24410 Gestational diabetes mellitus in pregnancy, diet controlled: Secondary | ICD-10-CM | POA: Diagnosis not present

## 2017-12-31 DIAGNOSIS — Z3A36 36 weeks gestation of pregnancy: Secondary | ICD-10-CM

## 2017-12-31 DIAGNOSIS — O09213 Supervision of pregnancy with history of pre-term labor, third trimester: Secondary | ICD-10-CM

## 2017-12-31 DIAGNOSIS — Z3480 Encounter for supervision of other normal pregnancy, unspecified trimester: Secondary | ICD-10-CM

## 2017-12-31 DIAGNOSIS — O99213 Obesity complicating pregnancy, third trimester: Secondary | ICD-10-CM | POA: Insufficient documentation

## 2017-12-31 DIAGNOSIS — M7672 Peroneal tendinitis, left leg: Secondary | ICD-10-CM | POA: Diagnosis not present

## 2017-12-31 DIAGNOSIS — Z98891 History of uterine scar from previous surgery: Secondary | ICD-10-CM

## 2017-12-31 DIAGNOSIS — O34219 Maternal care for unspecified type scar from previous cesarean delivery: Secondary | ICD-10-CM | POA: Insufficient documentation

## 2017-12-31 DIAGNOSIS — F32A Depression, unspecified: Secondary | ICD-10-CM

## 2017-12-31 DIAGNOSIS — F329 Major depressive disorder, single episode, unspecified: Secondary | ICD-10-CM

## 2017-12-31 MED ORDER — DICLOFENAC SODIUM 1 % TD GEL: 2.0000 g | Freq: Two times a day (BID) | TRANSDERMAL | 0 refills | Status: DC | PRN

## 2017-12-31 NOTE — Progress Notes (Signed)
Office Visit Note   Patient: Colleen Fields           Date of Birth: 10/18/1990           MRN: [id number] Visit Date: 12/31/2017              Requested by: Rolm Bookbinder, CNM [address] [place], [place] [address] PCP: Patient, [name]   Assessment & Plan: Visit Diagnoses:  1. Peroneal tendinitis of left lower leg     Plan: Impression is left foot peroneal tendinitis.  We will place the patient in a cam walker weightbearing as tolerated.  Should this throw off her balance she will transition into a stiff soled shoe.  She will rest as much as possible as well.  She will follow-up with [place] after she has her baby if she is still in pain, and at that point we may obtain x-rays to ensure there is no fracture.  Follow-Up Instructions: Return if symptoms worsen or fail to improve.   Orders:  No orders of the defined types were placed in this encounter.  Meds ordered this encounter  Medications  . diclofenac sodium (VOLTAREN) 1 % GEL    Sig: Apply 2 g topically 2 (two) times daily as needed.    Dispense:  1 Tube    Refill:  0      Procedures: No procedures performed   Clinical Data: No additional findings.   Subjective: Chief Complaint  Patient presents with  . Left Foot - Pain    HPI patient is a pleasant 27 year old female who is 8 months pregnant and presents to our clinic today with left foot pain.  This is been bothering her since April 2018.  She was seen in the ED where x-rays were never obtained.  She states that this was wrapped and she was given crutches.  This got better with time.  Her pain returned approximately 1 month ago when walking down an escalator.  No specific injury at that time.  The pain she has is along the peroneal tendon worse at the attachment to the navicular.  Pain is worse standing for long periods of time.  No numbness, tingling or burning.  Review of Systems as detailed in HPI.  All others reviewed and are  negative.   Objective: Vital Signs: LMP 04/23/2017 (Exact Date)   Physical Exam well-developed and well-nourished female no acute distress.  Alert and oriented x3.  Ortho Exam examination of her left foot reveals moderate tenderness along the peroneal tendon worse at the navicular.  Increased pain with all motions of the ankle.  Specialty Comments:  No specialty comments available.  Imaging: No imaging today of the left foot as the patient is pregnant.    [place] Mfm Ob Follow Up  Result Date: 12/31/2017 ----------------------------------------------------------------------  OBSTETRICS REPORT                      (Signed Final 12/31/2017 10:20 am) ---------------------------------------------------------------------- Patient Info  ID #:       [id number]                          D.O.B.:  05-07-1991 (26 yrs)  Name:       Colleen Fields                  Visit Date: 12/31/2017 08:56 am ---------------------------------------------------------------------- Performed By  Performed By:  Lenise Arena        Ref. Address:     [address]                                                             [place], [place]                                                             [address]  Attending:        Particia Nearing MD       Location:         [facility]  Referred By:      Adam Phenix                    MD ---------------------------------------------------------------------- Orders   #  Description                                 Code   1  [place] MFM OB FOLLOW UP                         [phone]  ----------------------------------------------------------------------   #  Ordered By               Order #        Accession #    Episode #   1  Particia Nearing            [id number]      [id number]     [id number]  ---------------------------------------------------------------------- Indications   [redacted] weeks gestation of  pregnancy                Z3A.36   Poor obstetric history: Previous preterm       O09.219   delivery, antepartum (@ 35wks)   Encounter for other antenatal screening        Z36.2   follow-up   History of cesarean delivery, currently        O34.219   pregnant   Gestational diabetes in pregnancy, diet        O24.410   controlled   Obesity complicating pregnancy, third          O99.213   trimester (Pre Pregnancy BMI 46.4)  ---------------------------------------------------------------------- OB History  Blood Type:            Height:  4'11"  Weight (lb):  242       BMI:  48.87  Gravidity:    2         Prem:   1  Living:       1 ---------------------------------------------------------------------- Fetal Evaluation  Num Of Fetuses:     1  Fetal Heart         158  Rate(bpm):  Cardiac Activity:   Observed  Presentation:       Cephalic  Placenta:           Posterior, above cervical os  P. Cord Insertion:  Previously Visualized  Amniotic Fluid  AFI FV:      Subjectively within normal limits  AFI Sum(cm)     %Tile       Largest Pocket(cm)  11.31           32          3.21  RUQ(cm)       RLQ(cm)       LUQ(cm)        LLQ(cm)  2.34          3.21          2.68           3.08 ---------------------------------------------------------------------- Biometry  BPD:      90.6  mm     G. Age:  36w 5d         77  %    CI:        83.74   %    70 - 86                                                          FL/HC:      22.2   %    20.1 - 22.1  HC:      312.1  mm     G. Age:  34w 6d          7  %    HC/AC:      0.93        0.93 - 1.11  AC:       335   mm     G. Age:  37w 3d         91  %    FL/BPD:     76.6   %    71 - 87  FL:       69.4  mm     G. Age:  35w 4d         36  %    FL/AC:      20.7   %    20 - 24  Est. FW:    2986  gm      6 lb 9 oz     77  % ---------------------------------------------------------------------- Gestational Age  LMP:           36w 0d        Date:  04/23/17                 EDD:   01/28/18  U/S Today:     36w 1d                                         EDD:   01/27/18  Best:          Stevie Kern 0d  Det. By:  LMP  (04/23/17)          EDD:   01/28/18 ---------------------------------------------------------------------- Anatomy  Cranium:               Appears normal         Aortic Arch:            Previously seen  Cavum:                 Appears normal         Ductal Arch:            Not well visualized  Ventricles:            Appears normal         Diaphragm:              Appears normal  Choroid Plexus:        Previously seen        Stomach:                Appears normal, left                                                                        sided  Cerebellum:            Previously seen        Abdomen:                Appears normal  Posterior Fossa:       Previously seen        Abdominal Wall:         Previously seen  Nuchal Fold:           Not applicable (>20    Cord Vessels:           Previously seen                         wks GA)  Face:                  Profile previously     Kidneys:                Appear normal                         seen  Lips:                  Previously seen        Bladder:                Appears normal  Thoracic:              Appears normal         Spine:                  Limited views  appear normal  Heart:                 Appears normal         Upper Extremities:      Previously seen                         (4CH, axis, and situs  RVOT:                  Appears normal         Lower Extremities:      Previously seen  LVOT:                  Appears normal  Other:  Fetus appears to be a female. Heels previously visualized. Nasal bone          previously visualized. Technically difficult due to maternal habitus and          fetal position. ---------------------------------------------------------------------- Cervix Uterus Adnexa  Cervix  Not visualized (advanced GA >29wks)  ---------------------------------------------------------------------- Impression  SIUP at 36+0 weeks  Cephalic presentation  Normal interval anatomy; anatomic survey complete  Normal amniotic fluid volume  Appropriate interval growth with EFW at the 77th %tile ---------------------------------------------------------------------- Recommendations  Follow-up as clinically indicated ----------------------------------------------------------------------                 Particia Nearing, MD Electronically Signed Final Report   12/31/2017 10:20 am ----------------------------------------------------------------------    PMFS History: Patient Active Problem List   Diagnosis Date Noted  . Peroneal tendinitis of left lower leg 12/31/2017  . Depression affecting pregnancy, antepartum 12/09/2017  . Gestational diabetes 09/05/2017  . Supervision of other normal pregnancy, antepartum 07/22/2017  . History of cesarean section, unknown scar 07/22/2017  . Obesity affecting pregnancy, antepartum 07/22/2017   Past Medical History:  Diagnosis Date  . Depression   . Migraine     Family History  Problem Relation Age of Onset  . Hypertension Mother   . Bipolar disorder Father   . Schizophrenia Father     Past Surgical History:  Procedure Laterality Date  . CESAREAN SECTION    . WISDOM TOOTH EXTRACTION     Social History   Occupational History  . Not on file  Tobacco Use  . Smoking status: Never Smoker  . Smokeless tobacco: Never Used  Substance and Sexual Activity  . Alcohol use: No  . Drug use: No    Types: Marijuana    Comment: "hasn't smoked in forever"  . Sexual activity: Yes    Birth control/protection: None

## 2018-01-05 ENCOUNTER — Other Ambulatory Visit (HOSPITAL_COMMUNITY)
Admission: RE | Admit: 2018-01-05 | Discharge: 2018-01-05 | Disposition: A | Discharge status: Home / Self Care | Payer: Medicaid Other | Source: Ambulatory Visit | Attending: Obstetrics and Gynecology | Admitting: Obstetrics and Gynecology

## 2018-01-05 ENCOUNTER — Ambulatory Visit (INDEPENDENT_AMBULATORY_CARE_PROVIDER_SITE_OTHER): Payer: Medicaid Other | Admitting: Obstetrics and Gynecology

## 2018-01-05 VITALS — BP 126/73 | HR 106 | Wt 238.8 lb

## 2018-01-05 DIAGNOSIS — O24419 Gestational diabetes mellitus in pregnancy, unspecified control: Secondary | ICD-10-CM | POA: Diagnosis present

## 2018-01-05 DIAGNOSIS — O99213 Obesity complicating pregnancy, third trimester: Secondary | ICD-10-CM

## 2018-01-05 DIAGNOSIS — E669 Obesity, unspecified: Secondary | ICD-10-CM

## 2018-01-05 DIAGNOSIS — O34219 Maternal care for unspecified type scar from previous cesarean delivery: Secondary | ICD-10-CM

## 2018-01-05 DIAGNOSIS — Z3480 Encounter for supervision of other normal pregnancy, unspecified trimester: Secondary | ICD-10-CM | POA: Diagnosis not present

## 2018-01-05 DIAGNOSIS — O99210 Obesity complicating pregnancy, unspecified trimester: Secondary | ICD-10-CM

## 2018-01-05 DIAGNOSIS — Z98891 History of uterine scar from previous surgery: Secondary | ICD-10-CM

## 2018-01-05 LAB — OB RESULTS CONSOLE GC/CHLAMYDIA: Gonorrhea: NEGATIVE

## 2018-01-05 LAB — OB RESULTS CONSOLE GBS: GBS: NEGATIVE

## 2018-01-05 NOTE — Progress Notes (Signed)
Pt declines Integrated Behavioral Health Clinician 

## 2018-01-05 NOTE — Progress Notes (Addendum)
Prenatal Visit Note Date: 01/05/2018 Clinic: [facility]  Subjective:  Colleen Fields is a 27 y.o. G2P0101 at 1556w5d being seen today for ongoing prenatal care.  She is currently monitored for the following issues for this high-risk pregnancy and has Supervision of other normal pregnancy, antepartum; History of cesarean delivery; Obesity affecting pregnancy, antepartum; Gestational diabetes; Depression affecting pregnancy, antepartum; and Peroneal tendinitis of left lower leg on their problem list.  Patient reports no complaints.   Contractions: Not present. Vag. Bleeding: None.  Movement: Present. Denies leaking of fluid.   The following portions of the patient's history were reviewed and updated as appropriate: allergies, current medications, past family history, past medical history, past social history, past surgical history and problem list. Problem list updated.  Objective:   Vitals:   01/05/18 1420  BP: 126/73  Pulse: (!) 106  Weight: 238 lb 12.8 oz (108.3 kg)    Fetal Status: Fetal Heart Rate (bpm): 160   Movement: Present     General:  Alert, oriented and cooperative. Patient is in no acute distress.  Skin: Skin is warm and dry. No rash noted.   Cardiovascular: Normal heart rate noted  Respiratory: Normal respiratory effort, no problems with respiration noted  Abdomen: Soft, gravid, appropriate for gestational age. Pain/Pressure: Present     Pelvic:  Cervical exam deferred        Extremities: Normal range of motion.  Edema: None  Mental Status: Normal mood and affect. Normal behavior. Normal judgment and thought content.   Urinalysis:      Assessment and Plan:  Pregnancy: G2P0101 at 9456w5d  1. Supervision of other normal pregnancy, antepartum Routine care. D/w pt re: BC nv - Culture, beta strep (group b only) - GC/Chlamydia probe amp ([place])[facility]  2. History of cesarean delivery Op note reviewed. PLTCS. Pt desires TOLAC. Consent  already signed  3. Gestational diabetes mellitus (GDM), antepartum, gestational diabetes method of control unspecified AM fastings are all in the 100s-110s with 2hr PP acceptable but pt doesn't check after dinner ones. D/w her to try and check qid but based on her AM fastings all being elevated, I told her recommend starting meds. Pt is okay with starting insulin. Will bring back next week for DM education and pt told to please check after dinner ones to see if insulin needs to be with dinner or qhs. 6/21 growth u/s with efw 77%, ac 91%, afi 11.3, cephalic. Add on NST reactive today.   4. Obesity affecting pregnancy, antepartum  Preterm labor symptoms and general obstetric precautions including but not limited to vaginal bleeding, contractions, leaking of fluid and fetal movement were reviewed in detail with the patient. Please refer to After Visit Summary for other counseling recommendations.   RTC: 1wk hrob, nst/bpp. Dm education visit   [place] BingPickens, Zian Mohamed, MD

## 2018-01-06 ENCOUNTER — Encounter: Payer: Self-pay | Admitting: Family Medicine

## 2018-01-06 ENCOUNTER — Ambulatory Visit: Payer: Medicaid Other | Admitting: *Deleted

## 2018-01-06 ENCOUNTER — Encounter: Payer: Medicaid Other | Attending: Obstetrics & Gynecology | Admitting: *Deleted

## 2018-01-06 DIAGNOSIS — Z713 Dietary counseling and surveillance: Secondary | ICD-10-CM | POA: Insufficient documentation

## 2018-01-06 DIAGNOSIS — O24419 Gestational diabetes mellitus in pregnancy, unspecified control: Secondary | ICD-10-CM

## 2018-01-06 LAB — GC/CHLAMYDIA PROBE AMP (~~LOC~~) NOT AT ARMC
Chlamydia: NEGATIVE
Neisseria Gonorrhea: NEGATIVE

## 2018-01-06 NOTE — Progress Notes (Signed)
Insulin Instruction  Patient was seen on 01/06/2018 for insulin instruction. Patient states her EDD has been moved up from 7/19 to 01/21/2018. She also states her FBG are too high, but post meal numbers are better, so she anticipates only being on NPH in the evening based on her conversation with her OB MD.  The following learning objectives were met by the patient during this visit:   Insulin Action of NPH insulins  Reviewed syringe & vial including # units per syringe,    length of needles, gauge of needles  Hygiene and storage  Drawing up single and mixed doses if using vials   Single dose   Mixed dose: (will wait and see if she needs meal time insulin)  Rotation of Sites  Hypoglycemia- symptoms, causes , treatment choices  Record keeping and MD follow up   Patient demonstrated understanding of insulin administration by return demonstration.  Patient received the following handouts:  Insulin Instruction Handout  Site Rotation handout                                        Patient to start on insulin as Rx'd by MD  Patient will be seen for follow-up as needed.

## 2018-01-07 ENCOUNTER — Encounter (HOSPITAL_COMMUNITY): Payer: Self-pay | Admitting: *Deleted

## 2018-01-07 ENCOUNTER — Other Ambulatory Visit: Payer: Self-pay

## 2018-01-07 ENCOUNTER — Inpatient Hospital Stay (HOSPITAL_COMMUNITY)
Admission: AD | Admit: 2018-01-07 | Discharge: 2018-01-07 | Disposition: A | Discharge status: Home / Self Care | Payer: Medicaid Other | Source: Ambulatory Visit | Attending: Obstetrics and Gynecology | Admitting: Obstetrics and Gynecology

## 2018-01-07 DIAGNOSIS — Z98891 History of uterine scar from previous surgery: Secondary | ICD-10-CM

## 2018-01-07 DIAGNOSIS — O479 False labor, unspecified: Secondary | ICD-10-CM | POA: Insufficient documentation

## 2018-01-07 DIAGNOSIS — Z3480 Encounter for supervision of other normal pregnancy, unspecified trimester: Secondary | ICD-10-CM

## 2018-01-07 DIAGNOSIS — O99340 Other mental disorders complicating pregnancy, unspecified trimester: Secondary | ICD-10-CM

## 2018-01-07 DIAGNOSIS — F32A Depression, unspecified: Secondary | ICD-10-CM

## 2018-01-07 DIAGNOSIS — F329 Major depressive disorder, single episode, unspecified: Secondary | ICD-10-CM

## 2018-01-07 DIAGNOSIS — O99210 Obesity complicating pregnancy, unspecified trimester: Secondary | ICD-10-CM

## 2018-01-07 DIAGNOSIS — O24419 Gestational diabetes mellitus in pregnancy, unspecified control: Secondary | ICD-10-CM

## 2018-01-07 NOTE — MAU Note (Signed)
Has been cramping since last night, here and there. Now it's not going away.  Hurts to walk, can't close her leg.  Not really concerned, just wants to see what's going on.

## 2018-01-08 NOTE — MAU Note (Signed)
I have communicated with H.Hogan,CNM and reviewed vital signs:  Vitals:   01/07/18 1635 01/07/18 1846  BP: 124/68 (!) 101/50  Pulse: (!) 101   Resp: 17 18  Temp: 97.7 F (36.5 C)   SpO2: 99%     Vaginal exam:  Dilation: Closed Effacement (%): Thick Cervical Position: Posterior Station: -3 Presentation: Vertex Exam by:: K.Brealynn Contino,RN,   Also reviewed contraction pattern and that non-stress test is reactive.  It has been documented that patient is having irregular contracting every 3-87minutes with uterine irritability and  no cervical change since previous check in office hours not indicating active labor.  Patient denies any other complaints.  Based on this report provider has given order for discharge.  A discharge order and diagnosis entered by a provider.   Labor discharge instructions reviewed with patient.

## 2018-01-09 LAB — CULTURE, BETA STREP (GROUP B ONLY): Strep Gp B Culture: NEGATIVE

## 2018-01-10 ENCOUNTER — Telehealth: Payer: Self-pay | Admitting: General Practice

## 2018-01-10 NOTE — Telephone Encounter (Signed)
Patient called and left message on nurse voicemail line stating she has been having a lot more pressure making it difficult to walk recently. Patient states she has been having more contractions as well. Per chart review, patient has appt in office on 7/3.  Called patient, no answer- left message stating we are trying to reach you to return your phone call. We have received your voicemail message. I see you have an appt with us on 7/3, we can follow up with you at that time or if you feel it is more emergent please come back to the hospital for evaluation. You may call us back as well to discuss further.

## 2018-01-12 ENCOUNTER — Ambulatory Visit (INDEPENDENT_AMBULATORY_CARE_PROVIDER_SITE_OTHER): Payer: Medicaid Other | Admitting: *Deleted

## 2018-01-12 ENCOUNTER — Encounter: Payer: Self-pay | Admitting: Obstetrics & Gynecology

## 2018-01-12 ENCOUNTER — Ambulatory Visit (INDEPENDENT_AMBULATORY_CARE_PROVIDER_SITE_OTHER): Payer: Medicaid Other | Admitting: Obstetrics & Gynecology

## 2018-01-12 VITALS — BP 120/68 | HR 101 | Wt 238.9 lb

## 2018-01-12 DIAGNOSIS — O24419 Gestational diabetes mellitus in pregnancy, unspecified control: Secondary | ICD-10-CM | POA: Diagnosis present

## 2018-01-12 DIAGNOSIS — O0993 Supervision of high risk pregnancy, unspecified, third trimester: Secondary | ICD-10-CM

## 2018-01-12 DIAGNOSIS — Z98891 History of uterine scar from previous surgery: Secondary | ICD-10-CM

## 2018-01-12 MED ORDER — INSULIN NPH (HUMAN) (ISOPHANE) 100 UNIT/ML ~~LOC~~ SUSP: 10.0000 [IU] | Freq: Every day | SUBCUTANEOUS | 3 refills | Status: DC

## 2018-01-12 NOTE — Patient Instructions (Signed)
Return to clinic for any scheduled appointments or obstetric concerns, or go to MAU for evaluation   Labor Induction Labor induction is when steps are taken to cause a pregnant woman to begin the labor process. Most women go into labor on their own between 37 weeks and 42 weeks of the pregnancy. When this does not happen or when there is a medical need, methods may be used to induce labor. Labor induction causes a pregnant woman's uterus to contract. It also causes the cervix to soften (ripen), open (dilate), and thin out (efface). Usually, labor is not induced before 39 weeks of the pregnancy unless there is a problem with the baby or mother. Before inducing labor, your health care provider will consider a number of factors, including the following:  The medical condition of you and the baby.  How many weeks along you are.  The status of the baby's lung maturity.  The condition of the cervix.  The position of the baby.  What are the reasons for labor induction? Labor may be induced for the following reasons:  The health of the baby or mother is at risk.  The pregnancy is overdue by 1 week or more.  The water breaks but labor does not start on its own.  The mother has a health condition or serious illness, such as high blood pressure, infection, placental abruption, or diabetes.  The amniotic fluid amounts are low around the baby.  The baby is distressed.  Convenience or wanting the baby to be born on a certain date is not a reason for inducing labor. What methods are used for labor induction? Several methods of labor induction may be used, such as:  Prostaglandin medicine. This medicine causes the cervix to dilate and ripen. The medicine will also start contractions. It can be taken by mouth or by inserting a suppository into the vagina.  Inserting a thin tube (catheter) with a balloon on the end into the vagina to dilate the cervix. Once inserted, the balloon is expanded  with water, which causes the cervix to open.  Stripping the membranes. Your health care provider separates amniotic sac tissue from the cervix, causing the cervix to be stretched and causing the release of a hormone called progesterone. This may cause the uterus to contract. It is often done during an office visit. You will be sent home to wait for the contractions to begin. You will then come in for an induction.  Breaking the water. Your health care provider makes a hole in the amniotic sac using a small instrument. Once the amniotic sac breaks, contractions should begin. This may still take hours to see an effect.  Medicine to trigger or strengthen contractions. This medicine is given through an IV access tube inserted into a vein in your arm.  All of the methods of induction, besides stripping the membranes, will be done in the hospital. Induction is done in the hospital so that you and the baby can be carefully monitored. How long does it take for labor to be induced? Some inductions can take up to 2-3 days. Depending on the cervix, it usually takes less time. It takes longer when you are induced early in the pregnancy or if this is your first pregnancy. If a mother is still pregnant and the induction has been going on for 2-3 days, either the mother will be sent home or a cesarean delivery will be needed. What are the risks associated with labor induction? Some of the risks   of induction include:  Changes in fetal heart rate, such as too high, too low, or erratic.  Fetal distress.  Chance of infection for the mother and baby.  Increased chance of having a cesarean delivery.  Breaking off (abruption) of the placenta from the uterus (rare).  Uterine rupture (very rare).  When induction is needed for medical reasons, the benefits of induction may outweigh the risks. What are some reasons for not inducing labor? Labor induction should not be done if:  It is shown that your baby does  not tolerate labor.  You have had previous surgeries on your uterus, such as a myomectomy or the removal of fibroids.  Your placenta lies very low in the uterus and blocks the opening of the cervix (placenta previa).  Your baby is not in a head-down position.  The umbilical cord drops down into the birth canal in front of the baby. This could cut off the baby's blood and oxygen supply.  You have had a previous cesarean delivery.  There are unusual circumstances, such as the baby being extremely premature.  This information is not intended to replace advice given to you by your health care provider. Make sure you discuss any questions you have with your health care provider. Document Released: 11/18/2006 Document Revised: 12/05/2015 Document Reviewed: 01/26/2013 Elsevier Interactive Patient Education  2017 [employer]  

## 2018-01-12 NOTE — Progress Notes (Signed)
Pt states she checks her blood sugar 3 times daily (does not check after dinner).  Pt has observed less overall FM x2-3 days but still is moving. She also reports having intermittent H/A x1 week.

## 2018-01-12 NOTE — Progress Notes (Signed)
   PRENATAL VISIT NOTE  Subjective:  Colleen Fields is a 27 y.o. G2P0101 at 7258w5d being seen today for ongoing prenatal care.  She is currently monitored for the following issues for this high-risk pregnancy and has Supervision of high-risk pregnancy; History of cesarean delivery; Obesity affecting pregnancy, antepartum; Gestational diabetes; Depression affecting pregnancy, antepartum; and Peroneal tendinitis of left lower leg on their problem list.  Patient reports no complaints.  Contractions: Irregular. Vag. Bleeding: None.  Movement: (!) Decreased. Denies leaking of fluid.   The following portions of the patient's history were reviewed and updated as appropriate: allergies, current medications, past family history, past medical history, past social history, past surgical history and problem list. Problem list updated.  Objective:   Vitals:   01/12/18 1500  BP: 120/68  Pulse: (!) 101  Weight: 238 lb 14.4 oz (108.4 kg)    Fetal Status: Fetal Heart Rate (bpm): NST   Movement: (!) Decreased     General:  Alert, oriented and cooperative. Patient is in no acute distress.  Skin: Skin is warm and dry. No rash noted.   Cardiovascular: Normal heart rate noted  Respiratory: Normal respiratory effort, no problems with respiration noted  Abdomen: Soft, gravid, appropriate for gestational age.  Pain/Pressure: Present     Pelvic: Cervical exam deferred        Extremities: Normal range of motion.  Edema: Trace  Mental Status: Normal mood and affect. Normal behavior. Normal judgment and thought content.   Assessment and Plan:  Pregnancy: G2P0101 at 2658w5d  1. Gestational diabetes mellitus (GDM), antepartum, gestational diabetes method of control unspecified [place] still 110s, NPH 10 units qhs started. NST performed today was reviewed and was found to be reactive.  Continue recommended antenatal testing and prenatal care next week. IOL to be scheduled at 39 weeks; orders placed (signed and  held). - insulin NPH Human (HUMULIN N,NOVOLIN N) 100 UNIT/ML injection; Inject 0.1 mLs (10 Units total) into the skin at bedtime.  Dispense: 10 mL; Refill: 3  2. History of cesarean delivery Desires TOLAC, consent under Media  3. Supervision of high risk pregnancy in third trimester Term labor symptoms and general obstetric precautions including but not limited to vaginal bleeding, contractions, leaking of fluid and fetal movement were reviewed in detail with the patient. Please refer to After Visit Summary for other counseling recommendations.  Return in about 1 week (around 01/19/2018) for OB Visit (HOB), NST, BPP.  Future Appointments  Date Time Provider Department Center  01/12/2018  4:15 PM Kammy Klett, Jeth[facility] BastosUgonna A, [facility]  01/20/2018  8:55 AM Macon LargeAnyanwu, Jethro BastosUgonna A, MD [facility]  01/26/2018  1:55 PM Conan Bowensavis, Kelly [facility]    Jaynie CollinsUgonna Shigeru Lampert, MD

## 2018-01-14 ENCOUNTER — Encounter (HOSPITAL_COMMUNITY): Payer: Self-pay | Admitting: *Deleted

## 2018-01-14 ENCOUNTER — Inpatient Hospital Stay (HOSPITAL_COMMUNITY)
Admission: AD | Admit: 2018-01-14 | Discharge: 2018-01-14 | Disposition: A | Discharge status: Home / Self Care | Payer: Medicaid Other | Source: Ambulatory Visit | Attending: Obstetrics and Gynecology | Admitting: Obstetrics and Gynecology

## 2018-01-14 ENCOUNTER — Telehealth: Payer: Self-pay | Admitting: General Practice

## 2018-01-14 ENCOUNTER — Other Ambulatory Visit: Payer: Self-pay

## 2018-01-14 DIAGNOSIS — O99343 Other mental disorders complicating pregnancy, third trimester: Secondary | ICD-10-CM | POA: Diagnosis not present

## 2018-01-14 DIAGNOSIS — O26893 Other specified pregnancy related conditions, third trimester: Secondary | ICD-10-CM | POA: Diagnosis not present

## 2018-01-14 DIAGNOSIS — F329 Major depressive disorder, single episode, unspecified: Secondary | ICD-10-CM | POA: Insufficient documentation

## 2018-01-14 DIAGNOSIS — Z91040 Latex allergy status: Secondary | ICD-10-CM | POA: Diagnosis not present

## 2018-01-14 DIAGNOSIS — Z3A38 38 weeks gestation of pregnancy: Secondary | ICD-10-CM | POA: Insufficient documentation

## 2018-01-14 DIAGNOSIS — Z98891 History of uterine scar from previous surgery: Secondary | ICD-10-CM

## 2018-01-14 DIAGNOSIS — O99213 Obesity complicating pregnancy, third trimester: Secondary | ICD-10-CM | POA: Insufficient documentation

## 2018-01-14 DIAGNOSIS — Z818 Family history of other mental and behavioral disorders: Secondary | ICD-10-CM | POA: Insufficient documentation

## 2018-01-14 DIAGNOSIS — O24419 Gestational diabetes mellitus in pregnancy, unspecified control: Secondary | ICD-10-CM

## 2018-01-14 DIAGNOSIS — O24414 Gestational diabetes mellitus in pregnancy, insulin controlled: Secondary | ICD-10-CM | POA: Diagnosis not present

## 2018-01-14 DIAGNOSIS — O34219 Maternal care for unspecified type scar from previous cesarean delivery: Secondary | ICD-10-CM | POA: Insufficient documentation

## 2018-01-14 DIAGNOSIS — R0602 Shortness of breath: Secondary | ICD-10-CM | POA: Diagnosis not present

## 2018-01-14 DIAGNOSIS — Z8249 Family history of ischemic heart disease and other diseases of the circulatory system: Secondary | ICD-10-CM | POA: Diagnosis not present

## 2018-01-14 DIAGNOSIS — F32A Depression, unspecified: Secondary | ICD-10-CM

## 2018-01-14 DIAGNOSIS — O99340 Other mental disorders complicating pregnancy, unspecified trimester: Secondary | ICD-10-CM

## 2018-01-14 DIAGNOSIS — O99210 Obesity complicating pregnancy, unspecified trimester: Secondary | ICD-10-CM

## 2018-01-14 DIAGNOSIS — R000 Tachycardia, unspecified: Secondary | ICD-10-CM | POA: Insufficient documentation

## 2018-01-14 DIAGNOSIS — O0993 Supervision of high risk pregnancy, unspecified, third trimester: Secondary | ICD-10-CM

## 2018-01-14 DIAGNOSIS — E669 Obesity, unspecified: Secondary | ICD-10-CM | POA: Diagnosis not present

## 2018-01-14 LAB — URINALYSIS, ROUTINE W REFLEX MICROSCOPIC
Bilirubin Urine: NEGATIVE
Glucose, UA: >=500 mg/dL — AB
Hgb urine dipstick: NEGATIVE
Ketones, ur: 20 mg/dL — AB
Leukocytes, UA: NEGATIVE
Nitrite: NEGATIVE
Protein, ur: NEGATIVE mg/dL
Specific Gravity, Urine: 1.008 (ref 1.005–1.030)
pH: 7 (ref 5.0–8.0)

## 2018-01-14 NOTE — Telephone Encounter (Signed)
Patient called and left message on nurse voicemail line stating she took her insulin last night and her fasting blood sugar was higher than normal. Patient wants to know if this is from the insulin. Called patient and discussed insulin would not raise her blood sugars and the increase was likely due to what she ate. Patient verbalized understanding & states she is on the way to the hospital now because she can hardly breath and feels her heart racing. Patient had no other questions at this time

## 2018-01-14 NOTE — MAU Note (Signed)
Out of breath, sometimes comes and goes- but it isn't going today. Was contracting earlier. Stopped when got in the car to come.

## 2018-01-14 NOTE — MAU Provider Note (Signed)
Chief Complaint:  Shortness of Breath   First Provider Initiated Contact with Patient 01/14/18 1654      HPI: Colleen Fields is a 27 y.o. G2P0101 at 95w0dho presents to maternity admissions reporting intermittent episodes of fast heartbeat and SOB x several weeks but usually resolves easily and today was persistent.  She reports it started this morning, and despite rest, drinking PO fluids, and position changes, she continued to feel short of breath and like her heart was too fast.  She reports it feels better now that she is lying down in MAU. She denies cardiac history.  She plans TOLAC after previous C/S, and has GDM on insulin with IOL scheduled at 39 weeks.  There are no other symptoms.  She reports good fetal movement, denies LOF, vaginal bleeding, or cramping /contractions. She feels some constant pelvic pressure and desires a cervical exam today.     HPI  Past Medical History: Past Medical History:  Diagnosis Date  . Depression   . Migraine     Past obstetric history: OB History  Gravida Para Term Preterm AB Living  2 1 0 1   1  SAB TAB Ectopic Multiple Live Births          1    # Outcome Date GA Lbr Len/2nd Weight Sex Delivery Anes PTL Lv  2 Current           1 Preterm 12/03/09 321w0d6 lb 12 oz (3.062 kg) F CS-Unspec Spinal      Past Surgical History: Past Surgical History:  Procedure Laterality Date  . CESAREAN SECTION    . WISDOM TOOTH EXTRACTION      Family History: Family History  Problem Relation Age of Onset  . Hypertension Mother   . Bipolar disorder Father   . Schizophrenia Father     Social History: Social History   Tobacco Use  . Smoking status: Never Smoker  . Smokeless tobacco: Never Used  Substance Use Topics  . Alcohol use: No  . Drug use: No    Types: Marijuana    Comment: "hasn't smoked in forever"    Allergies:  Allergies  Allergen Reactions  . Latex Itching and Other (See Comments)    Causes burning.    Meds:  No  medications prior to admission.    ROS:  Review of Systems  Constitutional: Negative for chills, fatigue and fever.  Eyes: Negative for visual disturbance.  Respiratory: Negative for shortness of breath.   Cardiovascular: Negative for chest pain.  Gastrointestinal: Negative for abdominal pain, nausea and vomiting.  Genitourinary: Negative for difficulty urinating, dysuria, flank pain, pelvic pain, vaginal bleeding, vaginal discharge and vaginal pain.  Neurological: Negative for dizziness and headaches.  Psychiatric/Behavioral: Negative.      I have reviewed patient's Past Medical Hx, Surgical Hx, Family Hx, Social Hx, medications and allergies.   Physical Exam   Patient Vitals for the past 24 hrs:  BP Temp Temp src Pulse Resp SpO2 Weight  01/14/18 2006 (!) 106/56 - - 82 18 - -  01/14/18 1930 - - - 85 - 100 % -  01/14/18 1900 - - - 85 - 100 % -  01/14/18 1830 - - - 85 - 100 % -  01/14/18 1800 - - - (!) 105 - 100 % -  01/14/18 1730 - - - 95 - 100 % -  01/14/18 1630 - - - - - 98 % -  01/14/18 1600 - - - - - 100 % -  01/14/18 1530 - - - - - 100 % -  01/14/18 1448 117/77 97.9 F (36.6 C) Oral (!) 110 20 99 % 239 lb 12 oz (108.7 kg)  01/14/18 1447 - - - - - 99 % -   Constitutional: Well-developed, well-nourished female in no acute distress.  HEART: normal rate, heart sounds, regular rhythm RESP: normal effort, lung sounds clear and equal bilaterally GI: Abd soft, non-tender, gravid appropriate for gestational age.  MS: Extremities nontender, no edema, normal ROM Neurologic: Alert and oriented x 4.  GU: Neg CVAT.    Dilation: Closed Effacement (%): 50 Cervical Position: Posterior Exam by:: Leftwich-Kirby, CNM  FHT:  Baseline 145 , moderate variability, accelerations present, no decelerations Contractions: rare, mild to palpation   Labs: Results for orders placed or performed during the hospital encounter of 01/14/18 (from the past 24 hour(s))  Urinalysis, Routine w  reflex microscopic     Status: Abnormal   Collection Time: 01/14/18  5:05 PM  Result Value Ref Range   Color, Urine YELLOW YELLOW   APPearance CLOUDY (A) CLEAR   Specific Gravity, Urine 1.008 1.005 - 1.030   pH 7.0 5.0 - 8.0   Glucose, UA >=500 (A) NEGATIVE mg/dL   Hgb urine dipstick NEGATIVE NEGATIVE   Bilirubin Urine NEGATIVE NEGATIVE   Ketones, ur 20 (A) NEGATIVE mg/dL   Protein, ur NEGATIVE NEGATIVE mg/dL   Nitrite NEGATIVE NEGATIVE   Leukocytes, UA NEGATIVE NEGATIVE   RBC / HPF 0-5 0 - 5 RBC/hpf   WBC, UA 6-10 0 - 5 WBC/hpf   Bacteria, UA RARE (A) NONE SEEN   Squamous Epithelial / LPF 21-50 0 - 5   Mucus PRESENT    O/Positive/-- (01/10 1203)  Imaging:    MAU Course/MDM: I have ordered labs and reviewed results.  NST reviewed and reactive Heart and lung sounds wnl EKG reviewed by cardiologist and considered wnl Symptoms x 4 weeks so not acute onset, making PE unlikely Consult Dr Kennon Rounds with presentation, exam findings and test results.  Referral to cardiology outpatient for possible SVD, cardiologist to make follow up appointment and office to call pt on Monday Pt without evidence of tachycardia and little shortness of breath while in MAU Pt discharge with strict return precautions.   Assessment: 1. Shortness of breath due to pregnancy in third trimester   2. Depression affecting pregnancy, antepartum   3. Gestational diabetes mellitus (GDM), antepartum, gestational diabetes method of control unspecified   4. Supervision of high risk pregnancy in third trimester   5. History of cesarean delivery   6. Obesity affecting pregnancy, antepartum   7. Tachycardia with heart rate 100-120 beats per minute     Plan: Discharge home Labor precautions and fetal kick counts Follow-up [facility] Follow up.   Specialty:  Obstetrics and Gynecology Why:  As scheduled next week, return to MAU with worsening symptoms or emergencies. Contact  information: [facility] Follow up.   Why:  The office will call you with appointment. Contact information: [place] [address] [phone]         Allergies as of 01/14/2018      Reactions   Latex Itching, Other (See Comments)   Causes burning.      Medication List    TAKE these medications   ACCU-CHEK FASTCLIX LANCETS Misc 1  Device by Percutaneous route 4 (four) times daily.   ACCU-CHEK GUIDE w/Device Kit USE DAILY TO CHECK BLOOD SUGARS AS DIRECTED   b complex vitamins tablet Take 1 tablet by mouth daily.   calcium carbonate 500 MG chewable tablet Commonly known as:  TUMS - dosed in mg elemental calcium Chew 1-2 tablets by mouth 2 (two) times daily as needed for indigestion or heartburn.   COD LIVER OIL PO Take 3 tablets by mouth daily.   diclofenac sodium 1 % Gel Commonly known as:  VOLTAREN Apply 2 g topically 2 (two) times daily as needed.   ferrous sulfate 325 (65 FE) MG tablet Take 325 mg by mouth daily with breakfast.   glucose blood test strip Commonly known as:  ACCU-CHEK GUIDE Use as instructed QID   insulin NPH Human 100 UNIT/ML injection Commonly known as:  HUMULIN N,NOVOLIN N Inject 0.1 mLs (10 Units total) into the skin at bedtime.   MAGNESIUM PO Take 400 mg by mouth daily.   prenatal multivitamin Tabs tablet Take 1 tablet by mouth daily at 12 noon.       Fatima Blank Certified Nurse-Midwife 01/14/2018 10:06 PM

## 2018-01-17 ENCOUNTER — Encounter: Payer: Self-pay | Admitting: *Deleted

## 2018-01-18 ENCOUNTER — Encounter (HOSPITAL_COMMUNITY): Payer: Self-pay | Admitting: *Deleted

## 2018-01-18 ENCOUNTER — Telehealth (HOSPITAL_COMMUNITY): Payer: Self-pay | Admitting: *Deleted

## 2018-01-18 NOTE — Telephone Encounter (Signed)
Preadmission screen  

## 2018-01-19 ENCOUNTER — Encounter: Payer: Self-pay | Admitting: *Deleted

## 2018-01-19 ENCOUNTER — Other Ambulatory Visit: Payer: Self-pay

## 2018-01-19 ENCOUNTER — Encounter: Payer: Self-pay | Admitting: Obstetrics & Gynecology

## 2018-01-20 ENCOUNTER — Inpatient Hospital Stay (EMERGENCY_DEPARTMENT_HOSPITAL)
Admission: AD | Admit: 2018-01-20 | Discharge: 2018-01-20 | Disposition: A | Discharge status: Home / Self Care | Payer: Medicaid Other | Source: Ambulatory Visit | Attending: Obstetrics and Gynecology | Admitting: Obstetrics and Gynecology

## 2018-01-20 ENCOUNTER — Ambulatory Visit (INDEPENDENT_AMBULATORY_CARE_PROVIDER_SITE_OTHER): Payer: Medicaid Other | Admitting: Obstetrics & Gynecology

## 2018-01-20 ENCOUNTER — Encounter: Payer: Self-pay | Admitting: Obstetrics & Gynecology

## 2018-01-20 ENCOUNTER — Ambulatory Visit (INDEPENDENT_AMBULATORY_CARE_PROVIDER_SITE_OTHER): Payer: Medicaid Other | Admitting: *Deleted

## 2018-01-20 ENCOUNTER — Other Ambulatory Visit: Payer: Self-pay

## 2018-01-20 ENCOUNTER — Ambulatory Visit: Payer: Self-pay

## 2018-01-20 ENCOUNTER — Encounter (HOSPITAL_COMMUNITY): Payer: Self-pay | Admitting: *Deleted

## 2018-01-20 VITALS — BP 129/74 | HR 91 | Wt 238.7 lb

## 2018-01-20 DIAGNOSIS — Z3689 Encounter for other specified antenatal screening: Secondary | ICD-10-CM

## 2018-01-20 DIAGNOSIS — O24419 Gestational diabetes mellitus in pregnancy, unspecified control: Secondary | ICD-10-CM | POA: Diagnosis present

## 2018-01-20 DIAGNOSIS — F329 Major depressive disorder, single episode, unspecified: Secondary | ICD-10-CM

## 2018-01-20 DIAGNOSIS — G43909 Migraine, unspecified, not intractable, without status migrainosus: Secondary | ICD-10-CM | POA: Insufficient documentation

## 2018-01-20 DIAGNOSIS — Z3A38 38 weeks gestation of pregnancy: Secondary | ICD-10-CM

## 2018-01-20 DIAGNOSIS — O99343 Other mental disorders complicating pregnancy, third trimester: Secondary | ICD-10-CM

## 2018-01-20 DIAGNOSIS — Z8249 Family history of ischemic heart disease and other diseases of the circulatory system: Secondary | ICD-10-CM

## 2018-01-20 DIAGNOSIS — O368130 Decreased fetal movements, third trimester, not applicable or unspecified: Secondary | ICD-10-CM | POA: Diagnosis not present

## 2018-01-20 DIAGNOSIS — O0993 Supervision of high risk pregnancy, unspecified, third trimester: Secondary | ICD-10-CM

## 2018-01-20 DIAGNOSIS — Z98891 History of uterine scar from previous surgery: Secondary | ICD-10-CM

## 2018-01-20 DIAGNOSIS — O24414 Gestational diabetes mellitus in pregnancy, insulin controlled: Secondary | ICD-10-CM | POA: Diagnosis not present

## 2018-01-20 DIAGNOSIS — O99353 Diseases of the nervous system complicating pregnancy, third trimester: Secondary | ICD-10-CM

## 2018-01-20 LAB — URINALYSIS, ROUTINE W REFLEX MICROSCOPIC
Bilirubin Urine: NEGATIVE
Glucose, UA: 50 mg/dL — AB
Hgb urine dipstick: NEGATIVE
Ketones, ur: 80 mg/dL — AB
Leukocytes, UA: NEGATIVE
Nitrite: NEGATIVE
Protein, ur: 100 mg/dL — AB
Specific Gravity, Urine: 1.023 (ref 1.005–1.030)
pH: 6 (ref 5.0–8.0)

## 2018-01-20 NOTE — MAU Provider Note (Signed)
Chief Complaint:  Decreased Fetal Movement   First Provider Initiated Contact with Patient 01/20/18 0113      HPI: Colleen Fields is a 27 y.o. G2P0101 at 66w6dho presents to maternity admissions reporting decreased fetal movement.  Patient reports she is feeling movement just not as much as usual since the night after dinner.  Patient reports fetus is usually active after dinner.  She reports decreased fetal movement also occurred 2 nights ago, she reports she drunk juice and baby became active after.  She reports having an induction scheduled for tomorrow for gestational diabetes insulin control.  She denies abdominal pain or abdominal cramping. Denies LOF and vaginal bleeding.  She reports having an ultrasound appointment scheduled for yesterday but missed the appointment due to car being towed rescheduled appointment for 01/20/2018.   Past Medical History: Past Medical History:  Diagnosis Date  . Complication of anesthesia    problems with epidural anesthesia during cs  . Depression   . Gestational diabetes    insulin  . Migraine     Past obstetric history: OB History  Gravida Para Term Preterm AB Living  2 1 0 1   1  SAB TAB Ectopic Multiple Live Births          1    # Outcome Date GA Lbr Len/2nd Weight Sex Delivery Anes PTL Lv  2 Current           1 Preterm 12/03/09 340w0d6 lb 12 oz (3.062 kg) F CS-Unspec Spinal  LIV    Past Surgical History: Past Surgical History:  Procedure Laterality Date  . CESAREAN SECTION    . WISDOM TOOTH EXTRACTION      Family History: Family History  Problem Relation Age of Onset  . Hypertension Mother   . Bipolar disorder Father   . Schizophrenia Father     Social History: Social History   Tobacco Use  . Smoking status: Never Smoker  . Smokeless tobacco: Never Used  Substance Use Topics  . Alcohol use: No  . Drug use: No    Types: Marijuana    Comment: "hasn't smoked in forever"    Allergies:  Allergies  Allergen Reactions   . Latex Itching and Other (See Comments)    Causes burning.    Meds:  Medications Prior to Admission  Medication Sig Dispense Refill Last Dose  . ACCU-CHEK FASTCLIX LANCETS MISC 1 Device by Percutaneous route 4 (four) times daily. 100 each 12 01/20/2018 at Unknown time  . b complex vitamins tablet Take 1 tablet by mouth daily.   01/20/2018 at Unknown time  . Blood Glucose Monitoring Suppl (ACCU-CHEK GUIDE) w/Device KIT USE DAILY TO CHECK BLOOD SUGARS AS DIRECTED  0 01/20/2018 at Unknown time  . calcium carbonate (TUMS - DOSED IN MG ELEMENTAL CALCIUM) 500 MG chewable tablet Chew 1-2 tablets by mouth 2 (two) times daily as needed for indigestion or heartburn.   Past Week at Unknown time  . COD LIVER OIL PO Take 3 tablets by mouth daily.    01/20/2018 at Unknown time  . diclofenac sodium (VOLTAREN) 1 % GEL Apply 2 g topically 2 (two) times daily as needed. 1 Tube 0 01/19/2018 at Unknown time  . ferrous sulfate 325 (65 FE) MG tablet Take 325 mg by mouth daily with breakfast.   01/20/2018 at Unknown time  . glucose blood (ACCU-CHEK GUIDE) test strip Use as instructed QID 100 each 12 01/20/2018 at Unknown time  . insulin NPH Human (HUMULIN  N,NOVOLIN N) 100 UNIT/ML injection Inject 0.1 mLs (10 Units total) into the skin at bedtime. 10 mL 3 01/19/2018 at Unknown time  . MAGNESIUM PO Take 400 mg by mouth daily.    01/20/2018 at Unknown time  . Prenatal Vit-Fe Fumarate-FA (PRENATAL MULTIVITAMIN) TABS tablet Take 1 tablet by mouth daily at 12 noon.   01/20/2018 at Unknown time    ROS:  Review of Systems  Constitutional:       Decreased fetal movement  Respiratory: Negative.   Cardiovascular: Negative.   Gastrointestinal: Negative.   Genitourinary: Negative.    I have reviewed patient's Past Medical Hx, Surgical Hx, Family Hx, Social Hx, medications and allergies.   Physical Exam   Patient Vitals for the past 24 hrs:  BP Temp Temp src Pulse Resp  01/20/18 0131 112/63 97.7 F (36.5 C) Oral 98 16    Constitutional: Well-developed, obese female in no acute distress.  Cardiovascular: normal rate Respiratory: normal effort GI: Abd soft, non-tender, gravid appropriate for gestational age.  Movement palpated externally.  MS: Extremities nontender, no edema, normal ROM Neurologic: Alert and oriented x 4.  GU: Neg CVAT.  Cervical exam deferred.   FHT:  Baseline 135, moderate variability, accelerations present, no decelerations Contractions: UI   Labs: Results for orders placed or performed during the hospital encounter of 01/20/18 (from the past 24 hour(s))  Urinalysis, Routine w reflex microscopic     Status: Abnormal   Collection Time: 01/20/18 12:54 AM  Result Value Ref Range   Color, Urine YELLOW YELLOW   APPearance CLOUDY (A) CLEAR   Specific Gravity, Urine 1.023 1.005 - 1.030   pH 6.0 5.0 - 8.0   Glucose, UA 50 (A) NEGATIVE mg/dL   Hgb urine dipstick NEGATIVE NEGATIVE   Bilirubin Urine NEGATIVE NEGATIVE   Ketones, ur 80 (A) NEGATIVE mg/dL   Protein, ur 100 (A) NEGATIVE mg/dL   Nitrite NEGATIVE NEGATIVE   Leukocytes, UA NEGATIVE NEGATIVE   RBC / HPF 6-10 0 - 5 RBC/hpf   WBC, UA 0-5 0 - 5 WBC/hpf   Bacteria, UA RARE (A) NONE SEEN   Squamous Epithelial / LPF 11-20 0 - 5   Mucus PRESENT    Ca Oxalate Crys, UA PRESENT    O/Positive/-- (01/10 1203)   MAU Course/MDM: Orders Placed This Encounter  Procedures  . Urinalysis, Routine w reflex microscopic  . Discharge patient Discharge disposition: 01-Home or Self Care; Discharge patient date: 01/20/2018   UA positive for ketones, encourage oral fluid and pitcher of water given NST reviewed-reactive  Patient reports fetal movement-reports movement is back to normal since arriving to MAU.   Pt discharge with strict fetal kick counts and appointment follow-up.  Instructed on how to obtain fetal kick counts.  Assessment: 1. NST (non-stress test) reactive   2. Decreased fetal movements in third trimester, single or  unspecified fetus   3. [redacted] weeks gestation of pregnancy   4. Insulin controlled gestational diabetes mellitus (GDM) in third trimester     Plan: Discharge home Labor precautions and fetal kick counts Follow-up with ultrasound appointment scheduled for today Follow-up with induction scheduled for tomorrow Return to MAU as needed Continue medication as prescribed  [place] for [place] Follow up.   Specialty:  Obstetrics and Gynecology Why:  Follow up as scheduled in the morning for ultrasound  Contact information: [place] [phone]          Allergies  as of 01/20/2018      Reactions   Latex Itching, Other (See Comments)   Causes burning.      Medication List    STOP taking these medications   b complex vitamins tablet   COD LIVER OIL PO   MAGNESIUM PO     TAKE these medications   ACCU-CHEK FASTCLIX LANCETS Misc 1 Device by Percutaneous route 4 (four) times daily.   ACCU-CHEK GUIDE w/Device Kit USE DAILY TO CHECK BLOOD SUGARS AS DIRECTED   calcium carbonate 500 MG chewable tablet Commonly known as:  TUMS - dosed in mg elemental calcium Chew 1-2 tablets by mouth 2 (two) times daily as needed for indigestion or heartburn.   diclofenac sodium 1 % Gel Commonly known as:  VOLTAREN Apply 2 g topically 2 (two) times daily as needed.   ferrous sulfate 325 (65 FE) MG tablet Take 325 mg by mouth daily with breakfast.   glucose blood test strip Commonly known as:  ACCU-CHEK GUIDE Use as instructed QID   insulin NPH Human 100 UNIT/ML injection Commonly known as:  HUMULIN N,NOVOLIN N Inject 0.1 mLs (10 Units total) into the skin at bedtime.   prenatal multivitamin Tabs tablet Take 1 tablet by mouth daily at 12 noon.       Darrol Poke Certified Nurse-Midwife 01/20/2018 1:38 AM

## 2018-01-20 NOTE — MAU Note (Signed)
Pt presents to MAU c/o DFM the last 48hrs. Pt states she is feeling movement just not as much. Pt denies bleeding or LOF.

## 2018-01-20 NOTE — Progress Notes (Signed)
Pt had visit to MAU last night due to decreased FM - had RNST, left hospital @ 0200.  IOL scheduled 7/12 @ 0730

## 2018-01-20 NOTE — Patient Instructions (Signed)
Labor Induction Labor induction is when steps are taken to cause a pregnant woman to begin the labor process. Most women go into labor on their own between 37 weeks and 42 weeks of the pregnancy. When this does not happen or when there is a medical need, methods may be used to induce labor. Labor induction causes a pregnant woman's uterus to contract. It also causes the cervix to soften (ripen), open (dilate), and thin out (efface). Usually, labor is not induced before 39 weeks of the pregnancy unless there is a problem with the baby or mother. Before inducing labor, your health care provider will consider a number of factors, including the following:  The medical condition of you and the baby.  How many weeks along you are.  The status of the baby's lung maturity.  The condition of the cervix.  The position of the baby. What are the reasons for labor induction? Labor may be induced for the following reasons:  The health of the baby or mother is at risk.  The pregnancy is overdue by 1 week or more.  The water breaks but labor does not start on its own.  The mother has a health condition or serious illness, such as high blood pressure, infection, placental abruption, or diabetes.  The amniotic fluid amounts are low around the baby.  The baby is distressed. Convenience or wanting the baby to be born on a certain date is not a reason for inducing labor. What methods are used for labor induction? Several methods of labor induction may be used, such as:  Prostaglandin medicine. This medicine causes the cervix to dilate and ripen. The medicine will also start contractions. It can be taken by mouth or by inserting a suppository into the vagina.  Inserting a thin tube (catheter) with a balloon on the end into the vagina to dilate the cervix. Once inserted, the balloon is expanded with water, which causes the cervix to open.  Stripping the membranes. Your health care provider separates  amniotic sac tissue from the cervix, causing the cervix to be stretched and causing the release of a hormone called progesterone. This may cause the uterus to contract. It is often done during an office visit. You will be sent home to wait for the contractions to begin. You will then come in for an induction.  Breaking the water. Your health care provider makes a hole in the amniotic sac using a small instrument. Once the amniotic sac breaks, contractions should begin. This may still take hours to see an effect.  Medicine to trigger or strengthen contractions. This medicine is given through an IV access tube inserted into a vein in your arm. All of the methods of induction, besides stripping the membranes, will be done in the hospital. Induction is done in the hospital so that you and the baby can be carefully monitored. How long does it take for labor to be induced? Some inductions can take up to 2-3 days. Depending on the cervix, it usually takes less time. It takes longer when you are induced early in the pregnancy or if this is your first pregnancy. If a mother is still pregnant and the induction has been going on for 2-3 days, either the mother will be sent home or a cesarean delivery will be needed. What are the risks associated with labor induction? Some of the risks of induction include:  Changes in fetal heart rate, such as too high, too low, or erratic.  Fetal distress.    Chance of infection for the mother and baby.  Increased chance of having a cesarean delivery.  Breaking off (abruption) of the placenta from the uterus (rare).  Uterine rupture (very rare). When induction is needed for medical reasons, the benefits of induction may outweigh the risks. What are some reasons for not inducing labor? Labor induction should not be done if:  It is shown that your baby does not tolerate labor.  You have had previous surgeries on your uterus, such as a myomectomy or the removal of  fibroids.  Your placenta lies very low in the uterus and blocks the opening of the cervix (placenta previa).  Your baby is not in a head-down position.  The umbilical cord drops down into the birth canal in front of the baby. This could cut off the baby's blood and oxygen supply.  You have had a previous cesarean delivery.  There are unusual circumstances, such as the baby being extremely premature. This information is not intended to replace advice given to you by your health care provider. Make sure you discuss any questions you have with your health care provider. Document Released: 11/18/2006 Document Revised: 12/05/2015 Document Reviewed: 01/26/2013 Elsevier Interactive Patient Education  2017 [employer]  

## 2018-01-20 NOTE — Progress Notes (Addendum)
   PRENATAL VISIT NOTE  Subjective:  Colleen Fields is a 27 y.o. G2P0101 at 5054w6d being seen today for ongoing prenatal care.  She is currently monitored for the following issues for this high-risk pregnancy and has Supervision of high-risk pregnancy; History of cesarean delivery; Obesity affecting pregnancy, antepartum; Gestational diabetes; Depression affecting pregnancy, antepartum; and Peroneal tendinitis of left lower leg on their problem list.  Patient reports occasional contractions.  Contractions: Irregular. Vag. Bleeding: None.  Movement: Present. Denies leaking of fluid.   The following portions of the patient's history were reviewed and updated as appropriate: allergies, current medications, past family history, past medical history, past social history, past surgical history and problem list. Problem list updated.  Objective:   Vitals:   01/20/18 1014  BP: 129/74  Pulse: 91  Weight: 238 lb 11.2 oz (108.3 kg)    Fetal Status: Fetal Heart Rate (bpm): NST   Movement: Present     General:  Alert, oriented and cooperative. Patient is in no acute distress.  Skin: Skin is warm and dry. No rash noted.   Cardiovascular: Normal heart rate noted  Respiratory: Normal respiratory effort, no problems with respiration noted  Abdomen: Soft, gravid, appropriate for gestational age.  Pain/Pressure: Present     Pelvic: Cervical exam deferred        Extremities: Normal range of motion.  Edema: Trace  Mental Status: Normal mood and affect. Normal behavior. Normal judgment and thought content.   NST performed today was reviewed and was found to be reactive. Subsequent BPP performed today was also reviewed and was found to be 10/10. AFI was also normal. Cephalic presentation.  Assessment and Plan:  Pregnancy: G2P0101 at 3354w6d  1. Insulin controlled gestational diabetes mellitus (GDM) during pregnancy, antepartum Still has fastings in 90s-110s despite NPH 10 units qhs.  Will be induced  tomorrow. BPP 10/10 today.  Will get postpartum 2 hr GTT.  2. History of cesarean delivery Hoping for VBAC.  3. Supervision of high risk pregnancy in third trimester Term labor symptoms and general obstetric precautions including but not limited to vaginal bleeding, contractions, leaking of fluid and fetal movement were reviewed in detail with the patient. Please refer to After Visit Summary for other counseling recommendations.  Return in about 1 month (around 02/17/2018) for Postpartum visit.  IOL on 7/12. Needs 2 hr GTT 6-7 weeks postpartum..  Future Appointments  Date Time Provider Department Center  01/21/2018  7:30 AM [facility] None  02/10/2018  2:30 PM Abelino DerrickKilroy, Luke K, PA-C [id number]  02/16/2018  1:35 PM Olena Willy, Jethro BastosUgonna A, MD [facility]    Jaynie CollinsUgonna Felisa Zechman, MD

## 2018-01-20 NOTE — Progress Notes (Signed)

## 2018-01-21 ENCOUNTER — Inpatient Hospital Stay (HOSPITAL_COMMUNITY)
Admission: RE | Admit: 2018-01-21 | Discharge: 2018-01-25 | DRG: 807 | Disposition: A | Discharge status: Home / Self Care | Payer: Medicaid Other | Source: Ambulatory Visit | Attending: Obstetrics & Gynecology | Admitting: Obstetrics & Gynecology

## 2018-01-21 ENCOUNTER — Encounter (HOSPITAL_COMMUNITY): Payer: Self-pay

## 2018-01-21 ENCOUNTER — Encounter: Payer: Self-pay | Admitting: *Deleted

## 2018-01-21 ENCOUNTER — Other Ambulatory Visit: Payer: Self-pay

## 2018-01-21 DIAGNOSIS — O99344 Other mental disorders complicating childbirth: Secondary | ICD-10-CM | POA: Diagnosis present

## 2018-01-21 DIAGNOSIS — F329 Major depressive disorder, single episode, unspecified: Secondary | ICD-10-CM | POA: Diagnosis present

## 2018-01-21 DIAGNOSIS — Z98891 History of uterine scar from previous surgery: Secondary | ICD-10-CM

## 2018-01-21 DIAGNOSIS — Z3A39 39 weeks gestation of pregnancy: Secondary | ICD-10-CM

## 2018-01-21 DIAGNOSIS — O368130 Decreased fetal movements, third trimester, not applicable or unspecified: Secondary | ICD-10-CM | POA: Diagnosis not present

## 2018-01-21 DIAGNOSIS — O34219 Maternal care for unspecified type scar from previous cesarean delivery: Secondary | ICD-10-CM | POA: Diagnosis present

## 2018-01-21 DIAGNOSIS — O99214 Obesity complicating childbirth: Secondary | ICD-10-CM | POA: Diagnosis present

## 2018-01-21 DIAGNOSIS — O24414 Gestational diabetes mellitus in pregnancy, insulin controlled: Secondary | ICD-10-CM | POA: Diagnosis not present

## 2018-01-21 DIAGNOSIS — O24424 Gestational diabetes mellitus in childbirth, insulin controlled: Secondary | ICD-10-CM | POA: Diagnosis not present

## 2018-01-21 DIAGNOSIS — Z3A38 38 weeks gestation of pregnancy: Secondary | ICD-10-CM | POA: Diagnosis not present

## 2018-01-21 DIAGNOSIS — E669 Obesity, unspecified: Secondary | ICD-10-CM | POA: Diagnosis present

## 2018-01-21 DIAGNOSIS — O24429 Gestational diabetes mellitus in childbirth, unspecified control: Secondary | ICD-10-CM | POA: Diagnosis not present

## 2018-01-21 LAB — GLUCOSE, CAPILLARY
Glucose-Capillary: 90 mg/dL (ref 70–99)
Glucose-Capillary: 71 mg/dL (ref 70–99)
Glucose-Capillary: 67 mg/dL — ABNORMAL LOW (ref 70–99)
Glucose-Capillary: 90 mg/dL (ref 70–99)
Glucose-Capillary: 89 mg/dL (ref 70–99)

## 2018-01-21 LAB — TYPE AND SCREEN
ABO/RH(D): O POS
Antibody Screen: NEGATIVE

## 2018-01-21 LAB — CBC
HCT: 30.1 % — ABNORMAL LOW (ref 36.0–46.0)
Hemoglobin: 10.5 g/dL — ABNORMAL LOW (ref 12.0–15.0)
MCH: 30.3 pg (ref 26.0–34.0)
MCHC: 34.9 g/dL (ref 30.0–36.0)
MCV: 87 fL (ref 78.0–100.0)
Platelets: 193 10*3/uL (ref 150–400)
RBC: 3.46 MIL/uL — ABNORMAL LOW (ref 3.87–5.11)
RDW: 14.8 % (ref 11.5–15.5)
WBC: 4.1 10*3/uL (ref 4.0–10.5)

## 2018-01-21 LAB — NO BLOOD PRODUCTS

## 2018-01-21 LAB — ABO/RH: ABO/RH(D): O POS

## 2018-01-21 LAB — RPR: RPR Ser Ql: NONREACTIVE

## 2018-01-21 MED ORDER — OXYTOCIN 40 UNITS IN LACTATED RINGERS INFUSION - SIMPLE MED
1.0000 m[IU]/min | INTRAVENOUS | Status: DC
  Administered 2018-01-21: 1 m[IU]/min via INTRAVENOUS
  Filled 2018-01-21 (×2): qty 1000

## 2018-01-21 MED ORDER — LACTATED RINGERS IV SOLN: 500.0000 mL | INTRAVENOUS | Status: DC | PRN

## 2018-01-21 MED ORDER — OXYTOCIN 40 UNITS IN LACTATED RINGERS INFUSION - SIMPLE MED: 2.5000 [IU]/h | INTRAVENOUS | Status: DC

## 2018-01-21 MED ORDER — OXYCODONE-ACETAMINOPHEN 5-325 MG PO TABS: 1.0000 | ORAL_TABLET | ORAL | Status: DC | PRN

## 2018-01-21 MED ORDER — FENTANYL CITRATE (PF) 100 MCG/2ML IJ SOLN
50.0000 ug | INTRAMUSCULAR | Status: DC | PRN
  Administered 2018-01-21 – 2018-01-23 (×12): 100 ug via INTRAVENOUS
  Filled 2018-01-21 (×12): qty 2

## 2018-01-21 MED ORDER — OXYTOCIN BOLUS FROM INFUSION
500.0000 mL | Freq: Once | INTRAVENOUS | Status: AC
  Administered 2018-01-23: 500 mL via INTRAVENOUS

## 2018-01-21 MED ORDER — ZOLPIDEM TARTRATE 5 MG PO TABS: 5.0000 mg | ORAL_TABLET | Freq: Every evening | ORAL | Status: DC | PRN

## 2018-01-21 MED ORDER — LACTATED RINGERS IV SOLN
INTRAVENOUS | Status: DC
  Administered 2018-01-21 – 2018-01-22 (×2): via INTRAVENOUS

## 2018-01-21 MED ORDER — TERBUTALINE SULFATE 1 MG/ML IJ SOLN
0.2500 mg | Freq: Once | INTRAMUSCULAR | Status: DC | PRN
  Filled 2018-01-21: qty 1

## 2018-01-21 MED ORDER — OXYCODONE-ACETAMINOPHEN 5-325 MG PO TABS: 2.0000 | ORAL_TABLET | ORAL | Status: DC | PRN

## 2018-01-21 MED ORDER — ACETAMINOPHEN 325 MG PO TABS: 650.0000 mg | ORAL_TABLET | ORAL | Status: DC | PRN

## 2018-01-21 MED ORDER — HYDROXYZINE HCL 50 MG PO TABS
50.0000 mg | ORAL_TABLET | Freq: Four times a day (QID) | ORAL | Status: DC | PRN
  Filled 2018-01-21: qty 1

## 2018-01-21 MED ORDER — ONDANSETRON HCL 4 MG/2ML IJ SOLN: 4.0000 mg | Freq: Four times a day (QID) | INTRAMUSCULAR | Status: DC | PRN

## 2018-01-21 MED ORDER — LIDOCAINE HCL (PF) 1 % IJ SOLN
30.0000 mL | INTRAMUSCULAR | Status: AC | PRN
  Administered 2018-01-23: 30 mL via SUBCUTANEOUS
  Filled 2018-01-21: qty 30

## 2018-01-21 MED ORDER — FLEET ENEMA 7-19 GM/118ML RE ENEM: 1.0000 | ENEMA | Freq: Every day | RECTAL | Status: DC | PRN

## 2018-01-21 MED ORDER — SOD CITRATE-CITRIC ACID 500-334 MG/5ML PO SOLN
30.0000 mL | ORAL | Status: DC | PRN
Start: 2018-01-21 — End: 2018-01-23

## 2018-01-21 NOTE — Progress Notes (Signed)
Subjective: Colleen Fields is a 27 y.o. G2P0101 at 7443w0d by LMP admitted for induction of labor due to Post dates. Due date 01/28/2018.  Objective: BP 121/67   Pulse 100   Temp 97.8 F (36.6 C) (Oral)   Resp 18   Ht 4\' 11"  (1.499 m)   Wt 108.7 kg (239 lb 11.2 oz)   LMP 04/23/2017 (Exact Date)   BMI 48.41 kg/m    FHT:  FHR: 150 bpm, variability: moderate,  accelerations:  Present,  decelerations:  Absent // bedside U/S to verify cephalic presentation UC:   irregular SVE:   Dilation: Closed Effacement (%): Thick Station: Ballotable Exam by:: Adelina MingsJ. Davis, RN(Called MD to request U/S confirmation prior to starting pit)   Labs: Lab Results  Component Value Date   WBC 4.1 01/21/2018   HGB 10.5 (L) 01/21/2018   HCT 30.1 (L) 01/21/2018   MCV 87.0 01/21/2018   PLT 193 01/21/2018    Assessment / Plan: Induction of labor due to postterm,  progressing well on pitocin  Labor: Progressing on Pitocin, will continue to increase then AROM Preeclampsia:  n/a Fetal Wellbeing:  Category I Pain Control:  Labor support without medications I/D:  n/a Anticipated MOD:  NSVD  Raelyn Moraolitta Herbie Lehrmann, MSN, CNM 01/21/2018, 9:45 AM

## 2018-01-21 NOTE — Progress Notes (Signed)
Labor Progress Note  Colleen Fields is a 27 y.o. G2P0101 at 7514w0d  admitted for induction of labor due to Diabetes.  S: Pt tolerating pain well now with Fentanyl. Foley bulb is out and contractions now more tolerable.   O:  BP (!) 119/59   Pulse 95   Temp 97.8 F (36.6 C) (Oral)   Resp 16   Ht 4\' 11"  (1.499 m)   Wt 108.7 kg (239 lb 11.2 oz)   LMP 04/23/2017 (Exact Date)   BMI 48.41 kg/m    FHT:  FHR: 135 bpm, variability: moderate,  accelerations:  Present,  decelerations:  Absent UC:   irregular, every 2-5 minutes, tolerable with pain medication SVE:   Dilation: 4 Effacement (%): Thick Station: -3 Exam by:: Colleen Bienenstockiana Castillo, RN Membranes still intact  Pitocin @ 10 mu/min  Labs: Lab Results  Component Value Date   WBC 4.1 01/21/2018   HGB 10.5 (L) 01/21/2018   HCT 30.1 (L) 01/21/2018   MCV 87.0 01/21/2018   PLT 193 01/21/2018    Assessment / Plan: 27 y.o. G2P0101 2514w0d in early labor.  Foley bulb out now as of 2115 will continue to increase Pitocin 1x1 d/t TOLAC status  Labor: Foley bulb out, currently on 10 mu/min of Pitocin  A2GDM: On insulin, most recent CBG was 90 at 2005, will hold bed time insulin. Will change CBGs to q2 hrs.  Fetal Wellbeing:  Category I Pain Control:  Receving Fentanyl now but may get epidural upon request Anticipated MOD:  SVD  Expectant management   Colleen Fields, MS3 01/21/18 9:55 PM

## 2018-01-21 NOTE — Progress Notes (Signed)
Patient ID: Colleen Fields, female   DOB: 08-23-1990, [age] y.o.   MRN: [id number]   Foley bulb placed at 1650.  Patient was 1/thick/-3.

## 2018-01-21 NOTE — H&P (Signed)
OBSTETRIC ADMISSION HISTORY AND PHYSICAL  Colleen Fields is a 27 y.o. female G2P0101 with IUP at 10w0dby LMP presenting for IOL for GDM complicated by cesarean section. She reports +FMs, No LOF, no VB. Patient endorses headaches that have been on and off for the last 3 weeks. She states the pain is felt on the top of her head. Patient has a hx of migraines and states these don't feel like her regular migraines. She endorses seeing spots in her vision upon sitting up at the clinic yesterday which quickly resolved in several minutes. Patient was nauseous last night without vomiting and has not been nauseous throughout this pregnancy. Per patient she went to the ER 01/14/18 for experiencing shortness of breath on exertion and rest and it was not treated. She plans on breast feeding. She is unsure on type of birth control. [facility]  Dating: By LMP --->  Estimated Date of Delivery: 01/28/18  Sono:    '@[redacted]w[redacted]d'$ , CWD, normal anatomy, breech presentation, 362g, 39% EFW   Prenatal History/Complications:  Past Medical History: Past Medical History:  Diagnosis Date  . Complication of anesthesia    problems with epidural anesthesia during cs  . Depression   . Gestational diabetes    insulin  . Migraine     Past Surgical History: Past Surgical History:  Procedure Laterality Date  . CESAREAN SECTION    . WISDOM TOOTH EXTRACTION      Obstetrical History: OB History    Gravida  2   Para  1   Term  0   Preterm  1   AB      Living  1     SAB      TAB      Ectopic      Multiple      Live Births  1           Social History: Social History   Socioeconomic History  . Marital status: Married    Spouse name: Not on file  . Number of children: Not on file  . Years of education: Not on file  . Highest education level: Not on file  Occupational History  . Not on file  Social Needs  . Financial resource strain: Not on file  . Food insecurity:    Worry: Not on file    Inability: Not on file  . Transportation needs:    Medical: Not on file    Non-medical: Not on file  Tobacco Use  . Smoking status: Never Smoker  . Smokeless tobacco: Never Used  Substance and Sexual Activity  . Alcohol use: No  . Drug use: No    Types: Marijuana    Comment: "hasn't smoked in forever"  . Sexual activity: Yes    Birth control/protection: None  Lifestyle  . Physical activity:    Days per week: Not on file    Minutes per session: Not on file  . Stress: Not on file  Relationships  . Social connections:    Talks on phone: Not on file    Gets together: Not on file    Attends religious service: Not on file    Active member of club or organization: Not on file    Attends meetings of clubs or organizations: Not on file    Relationship status: Not on file  Other Topics Concern  . Not on file  Social History Narrative  . Not on file  Family History: Family History  Problem Relation Age of Onset  . Hypertension Mother   . Bipolar disorder Father   . Schizophrenia Father     Allergies: Allergies  Allergen Reactions  . Latex Itching and Other (See Comments)    Causes burning.    Medications Prior to Admission  Medication Sig Dispense Refill Last Dose  . ACCU-CHEK FASTCLIX LANCETS MISC 1 Device by Percutaneous route 4 (four) times daily. 100 each 12 01/20/2018 at Unknown time  . Blood Glucose Monitoring Suppl (ACCU-CHEK GUIDE) w/Device KIT USE DAILY TO CHECK BLOOD SUGARS AS DIRECTED  0 01/20/2018 at Unknown time  . calcium carbonate (TUMS - DOSED IN MG ELEMENTAL CALCIUM) 500 MG chewable tablet Chew 1-2 tablets by mouth 2 (two) times daily as needed for indigestion or heartburn.   01/20/2018 at Unknown time  . diclofenac sodium (VOLTAREN) 1 % GEL Apply 2 g topically 2 (two) times daily as needed. 1 Tube 0 Past Week at Unknown time  . ferrous sulfate 325 (65 FE) MG tablet Take 325 mg by mouth daily with breakfast.   01/21/2018 at Unknown  time  . glucose blood (ACCU-CHEK GUIDE) test strip Use as instructed QID 100 each 12 01/20/2018 at Unknown time  . insulin NPH Human (HUMULIN N,NOVOLIN N) 100 UNIT/ML injection Inject 0.1 mLs (10 Units total) into the skin at bedtime. 10 mL 3 01/20/2018 at Unknown time  . Prenatal Vit-Fe Fumarate-FA (PRENATAL MULTIVITAMIN) TABS tablet Take 1 tablet by mouth daily at 12 noon.   01/21/2018 at Unknown time     Review of Systems   All systems reviewed and negative except as stated in HPI  Blood pressure 116/70, pulse 88, temperature 97.8 F (36.6 C), temperature source Oral, resp. rate 18, height '4\' 11"'$  (1.499 m), weight 108.7 kg (239 lb 11.2 oz), last menstrual period 04/23/2017. General appearance: alert, cooperative and no distress Lungs: clear to auscultation bilaterally Heart: regular rate and rhythm Abdomen: gravid Extremities: no sign of DVT Presentation: cephalic Fetal monitoringBaseline: 145 bpm, Variability: Good {> 6 bpm), Accelerations: Reactive and Decelerations: Absent Uterine activityFrequency: irregular, Duration: 60 seconds and Intensity: mild Dilation: Closed Effacement (%): Thick Station: Ballotable Exam by:: Thea Alken, RN(Called MD to request U/S confirmation prior to starting pit)   Prenatal labs: ABO, Rh: --/--/O POS (07/12 0830) Antibody: NEG (07/12 0830) Rubella: 2.72 (01/10 1203) RPR: Non Reactive (04/18 1459)  HBsAg: Negative (01/10 1203)  HIV: Non Reactive (04/18 1459)  GBS: Negative (06/26 0000)  GTT: abnormal Genetic screening:normal Anatomy US: normal  Prenatal Transfer Tool  Maternal Diabetes: Yes:  Diabetes Type:  Insulin/Medication controlled Genetic Screening: Normal Maternal Ultrasounds/Referrals: Normal Fetal Ultrasounds or other Referrals:  None Maternal Substance Abuse:  No Significant Maternal Medications:  Meds include: Other: insulin Significant Maternal Lab Results: Lab values include: Group B Strep negative  Results for orders placed  or performed during the hospital encounter of 01/21/18 (from the past 24 hour(s))  Glucose, capillary   Collection Time: 01/21/18  7:58 AM  Result Value Ref Range   Glucose-Capillary 90 70 - 99 mg/dL  CBC   Collection Time: 01/21/18  8:30 AM  Result Value Ref Range   WBC 4.1 4.0 - 10.5 K/uL   RBC 3.46 (L) 3.87 - 5.11 MIL/uL   Hemoglobin 10.5 (L) 12.0 - 15.0 g/dL   HCT 30.1 (L) 36.0 - 46.0 %   MCV 87.0 78.0 - 100.0 fL   MCH 30.3 26.0 - 34.0 pg   MCHC 34.9 30.0 -  36.0 g/dL   RDW 14.8 11.5 - 15.5 %   Platelets 193 150 - 400 K/uL  Type and screen   Collection Time: 01/21/18  8:30 AM  Result Value Ref Range   ABO/RH(D) O POS    Antibody Screen NEG    Sample Expiration      01/24/2018 Performed at [facility], [address], [place], [place] [address]     Patient Active Problem List   Diagnosis Date Noted  . Labor and delivery indication for care or intervention 01/21/2018  . Peroneal tendinitis of left lower leg 12/31/2017  . Depression affecting pregnancy, antepartum 12/09/2017  . Gestational diabetes 09/05/2017  . Supervision of high-risk pregnancy 07/22/2017  . History of cesarean delivery 07/22/2017  . Obesity affecting pregnancy, antepartum 07/22/2017    Assessment/Plan:  Colleen Fields is a 27 y.o. G2P0101 at 23w0dhere for IOL due to GDM complicated by history of cesarean section.  #Labor:IOL with foley bulb and pitocin #Pain: Epidural per patient request #FWB: Category I #ID:  None #MOF: Breast #MOC:Unsure #Circ:  Desires circumcision   LSherie Don Student-PA  01/21/2018, 11:31 AM

## 2018-01-22 ENCOUNTER — Encounter (HOSPITAL_COMMUNITY): Payer: Self-pay

## 2018-01-22 LAB — CBC
HCT: 32.2 % — ABNORMAL LOW (ref 36.0–46.0)
Hemoglobin: 11.2 g/dL — ABNORMAL LOW (ref 12.0–15.0)
MCH: 30.4 pg (ref 26.0–34.0)
MCHC: 34.8 g/dL (ref 30.0–36.0)
MCV: 87.5 fL (ref 78.0–100.0)
Platelets: 222 10*3/uL (ref 150–400)
RBC: 3.68 MIL/uL — ABNORMAL LOW (ref 3.87–5.11)
RDW: 15 % (ref 11.5–15.5)
WBC: 8.4 10*3/uL (ref 4.0–10.5)

## 2018-01-22 LAB — COMPREHENSIVE METABOLIC PANEL
ALT: 15 U/L (ref 0–44)
AST: 21 U/L (ref 15–41)
Albumin: 2.6 g/dL — ABNORMAL LOW (ref 3.5–5.0)
Alkaline Phosphatase: 83 U/L (ref 38–126)
Anion gap: 12 (ref 5–15)
BUN: <5 mg/dL — ABNORMAL LOW (ref 6–20)
CO2: 18 mmol/L — ABNORMAL LOW (ref 22–32)
Calcium: 9.2 mg/dL (ref 8.9–10.3)
Chloride: 104 mmol/L (ref 98–111)
Creatinine, Ser: 0.47 mg/dL (ref 0.44–1.00)
GFR calc Af Amer: >60 mL/min (ref >60)
GFR calc non Af Amer: >60 mL/min (ref >60)
Glucose, Bld: 113 mg/dL — ABNORMAL HIGH (ref 70–99)
Potassium: 3.5 mmol/L (ref 3.5–5.1)
Sodium: 134 mmol/L — ABNORMAL LOW (ref 135–145)
Total Bilirubin: 0.9 mg/dL (ref 0.3–1.2)
Total Protein: 6.2 g/dL — ABNORMAL LOW (ref 6.5–8.1)

## 2018-01-22 LAB — GLUCOSE, CAPILLARY
Glucose-Capillary: 85 mg/dL (ref 70–99)
Glucose-Capillary: 105 mg/dL — ABNORMAL HIGH (ref 70–99)
Glucose-Capillary: 118 mg/dL — ABNORMAL HIGH (ref 70–99)
Glucose-Capillary: 101 mg/dL — ABNORMAL HIGH (ref 70–99)

## 2018-01-22 LAB — PROTEIN / CREATININE RATIO, URINE
Creatinine, Urine: 38 mg/dL
Protein Creatinine Ratio: 0.16 mg/mg{Cre} — ABNORMAL HIGH (ref 0.00–0.15)
Total Protein, Urine: 6 mg/dL

## 2018-01-22 MED ORDER — PHENYLEPHRINE 40 MCG/ML (10ML) SYRINGE FOR IV PUSH (FOR BLOOD PRESSURE SUPPORT)
80.0000 ug | PREFILLED_SYRINGE | INTRAVENOUS | Status: DC | PRN
  Filled 2018-01-22 (×2): qty 10
  Filled 2018-01-22: qty 5
  Filled 2018-01-22: qty 10

## 2018-01-22 MED ORDER — PHENYLEPHRINE 40 MCG/ML (10ML) SYRINGE FOR IV PUSH (FOR BLOOD PRESSURE SUPPORT)
80.0000 ug | PREFILLED_SYRINGE | INTRAVENOUS | Status: DC | PRN
  Filled 2018-01-22: qty 5

## 2018-01-22 MED ORDER — FENTANYL 2.5 MCG/ML BUPIVACAINE 1/10 % EPIDURAL INFUSION (WH - ANES)
14.0000 mL/h | INTRAMUSCULAR | Status: DC | PRN
  Administered 2018-01-23: 14 mL/h via EPIDURAL
  Filled 2018-01-22 (×3): qty 100

## 2018-01-22 MED ORDER — EPHEDRINE 5 MG/ML INJ
10.0000 mg | INTRAVENOUS | Status: DC | PRN
  Filled 2018-01-22: qty 2

## 2018-01-22 MED ORDER — LACTATED RINGERS IV SOLN: 500.0000 mL | Freq: Once | INTRAVENOUS | Status: DC

## 2018-01-22 MED ORDER — DIPHENHYDRAMINE HCL 50 MG/ML IJ SOLN: 12.5000 mg | INTRAMUSCULAR | Status: DC | PRN

## 2018-01-22 NOTE — Anesthesia Pain Management Evaluation Note (Signed)
  [facility] Pain Management Visit Note  Patient: Colleen Fields, 27 y.o., female  "Hello I am a [facility] of the [facility]. We have an anesthesia team available at all times to provide care throughout the hospital, including epidural management and anesthesia for C-section. I don't know your plan for the delivery whether it a natural birth, water birth, IV sedation, nitrous supplementation, doula or epidural, but we want to meet your pain goals."   1.Was your pain managed to your expectations on prior hospitalizations?   Yes   2.What is your expectation for pain management during this hospitalization?     IV pain meds  3.How can we help you reach that goal? Support prn  Record the patient's initial score and the patient's pain goal.   Pain: 7  Pain Goal: 7 [facility] wants you to be able to say your pain was always managed very well.  [facility] CenterWRINKLE,Horice Carrero 01/22/2018

## 2018-01-22 NOTE — Progress Notes (Signed)
Labor Progress Note  Colleen Fields is a 27 y.o. G2P1001 at 1022w1d admitted for induction of labor due to Gestational diabetes. TOLAC  S: Resting. Gets uncomfortable with contractions. Wants to have natural delivery.   O:  BP 119/67   Pulse 88   Temp 98.7 F (37.1 C) (Oral)   Resp 16   Ht 4\' 11"  (1.499 m)   Wt 239 lb 11.2 oz (108.7 kg)   LMP 04/23/2017 (Exact Date)   BMI 48.41 kg/m   No intake/output data recorded.  FHT:  FHR: 150 bpm, variability: moderate,  accelerations:  Present,  decelerations:  Absent UC:   regular, every 2-4 minutes SVE:   Dilation: 6 Effacement (%): 90 Station: -1 Exam by:: Dr. Doroteo GlassmanPhelps  Pitocin @ 16 mu/min  Labs: Lab Results  Component Value Date   WBC 8.4 01/22/2018   HGB 11.2 (L) 01/22/2018   HCT 32.2 (L) 01/22/2018   MCV 87.5 01/22/2018   PLT 222 01/22/2018    Assessment / Plan: 27 y.o. G2P1001 822w1d in early labor Induction of labor due to gestational diabetes,  progressing well on pitocin  TOLAC  Labor: Progressing on Pitocin, will continue to increase then AROM  GDM: CBGs were not checked regularly during day. Now 6cm so wills tart q2hr monitoring. RN aware. Was on insulin antepartum.  Fetal Wellbeing:  Category I Pain Control:  Labor support without medications Anticipated MOD:  VBAC  Expectant management   Colleen AdaJazma Jewett Mcgann, DO OB Fellow Center for [facility], [facility]

## 2018-01-22 NOTE — Progress Notes (Signed)
Labor Progress Note Colleen Fields is a 27 y.o. G2P0101 at 3713w1d presented for IOL for A2GDM, TOLAC.  S: Doing ok, feeling slightly emotional and down per mom. Requesting shower.  O:  BP 124/62   Pulse (!) 104   Temp 98.7 F (37.1 C) (Oral)   Resp 16   Ht 4\' 11"  (1.499 m)   Wt 239 lb 11.2 oz (108.7 kg)   LMP 04/23/2017 (Exact Date)   BMI 48.41 kg/m   UUV:[id number]:baseline rate 145, moderate variability, + acels, no decels Toco: ctx irregular, difficult to trace  CVE: Dilation: 4.5 Effacement (%): Thick Cervical Position: Anterior Station: -3 Presentation: Vertex Exam by:: Ulanda EdisonElizabeth Poore, RN  A&P: 27 y.o. [phone] 6813w1d here for IOL for A2GDM and TOLAC. Labor: Progressing slowly. Will give Pit break for ~1-1.5hr to allow for shower. Will then restart Pit at 10. Pain: Epidural on request once Pit restarted FWB: Cat I GDM: CBGs wnl, continue to monitor q4h  Ellwood DenseAlison Rumball, DO 10:59 AM

## 2018-01-22 NOTE — Progress Notes (Signed)
Labor Progress Note Colleen Fields is a 27 y.o. G2P0101 at 1827w1d presented for IOL for A2GDM on insulin, TOLAC. Requested Pitocin break for shower and meal  S:  Getting more uncomfortable with ctx, recent dose of IV pain med  O:  BP (!) 150/82   Pulse (!) 102   Temp 98.5 F (36.9 C) (Oral)   Resp 16   Ht 4\' 11"  (1.499 m)   Wt 239 lb 11.2 oz (108.7 kg)   LMP 04/23/2017 (Exact Date)   BMI 48.41 kg/m  EFM: baseline 160 bpm/ mod variability/ + accels/ no decels  Toco: 2-3 SVE: Dilation: 5 Effacement (%): 70 Cervical Position: Anterior Station: -2, -3 Presentation: Vertex Exam by:: Donette LarryMelanie Zyan Coby, CNM Pitocin: off  A/P: 27 y.o. G2P0101 4427w1d  1. Labor: latent 2. FWB: Cat I 3. Pain: analgesia/anesthesia prn  Restart Pitocin. Anticipate labor progression and VBAC.  Donette LarryMelanie Towanna Avery, CNM 12:59 PM

## 2018-01-22 NOTE — Progress Notes (Addendum)
Patient ID: Colleen Fields, female   DOB: [birth year], 27 y.o.   MRN: [id number] Labor Progress Note  Colleen Fields is a 27 y.o. G2P0101 at 1453w1d  admitted for induction of labor due to A2Gestational diabetes.  S: Pain is getting worse but still able to breath through contractions. Otherwise no complaints.  O:  BP 123/67   Pulse 92   Temp 98.8 F (37.1 C) (Oral)   Resp 16   Ht 4\' 11"  (1.499 m)   Wt 108.7 kg (239 lb 11.2 oz)   LMP 04/23/2017 (Exact Date)   BMI 48.41 kg/m   FHT:  FHR: 150 bpm, variability: moderate,  accelerations:  Present,  decelerations:  Absent UC:   regular, every 2-3 minutes SVE:   Dilation: 4 Effacement (%): Thick Station: -3 Exam by:: Gearldine Bienenstockiana Castillo, RN Membranes still intact  Pitocin @ 18 mu/min  Labs: Lab Results  Component Value Date   WBC 4.1 01/21/2018   HGB 10.5 (L) 01/21/2018   HCT 30.1 (L) 01/21/2018   MCV 87.0 01/21/2018   PLT 193 01/21/2018    Assessment / Plan: 27 y.o. G2P0101 2553w1d in early labor. Induction of labor due to gestational diabetes,  progressing well on pitocin, contractions getting stronger. Instructed pt to let staff know if she wants an epidural. Will AROM at next check to progress labor.  Labor: Pitocin at 18 mu/min, will cont to increase and then AROM at next check  A2GDM: Most recent CBG was 85. Sugars stable. Fetal Wellbeing:  Category I Pain Control:  Currently receiving Fentanyl, epidural at pt's request Anticipated MOD:  NSVD  Expectant management   Lexi Betancourt. MS3 01/22/18 3:14 AM   OB FELLOW MEDICAL STUDENT NOTE ATTESTATION I confirm that I have verified the information documented in the medical student's note and that I have also personally performed the physical exam and all medical decision making activities.  Frederik PearJulie P Degele, MD OB Fellow 01/22/2018, 3:40 AM

## 2018-01-22 NOTE — Progress Notes (Signed)
LABOR PROGRESS NOTE  Colleen Fields is a 27 y.o. G2P0101 at 2643w1d  admitted for IOL for A2GDM  Subjective: Patient reports she would like to walk. She declines AROM at this time, as she states last time once she had AROM done that is when baby started having non reassuring FHT and she needed to have C/S  Objective: BP (!) 116/49   Pulse (!) 101   Temp 98.8 F (37.1 C) (Oral)   Resp 16   Ht 4\' 11"  (1.499 m)   Wt 239 lb 11.2 oz (108.7 kg)   LMP 04/23/2017 (Exact Date)   BMI 48.41 kg/m  or  Vitals:   01/22/18 0137 01/22/18 0138 01/22/18 0347 01/22/18 0640  BP:  123/67 (!) 154/85 (!) 116/49  Pulse:  92 93 (!) 101  Resp:      Temp: 98.8 F (37.1 C)     TempSrc: Oral     Weight:      Height:        SVE: Dilation: 4.5 Effacement (%): Thick Cervical Position: Anterior Station: -3 Presentation: Vertex Exam by:: Ulanda EdisonElizabeth Poore, RN FHT: baseline rate 150, moderate varibility, +acel, no decel Toco: occasional irregular ctx  Assessment / Plan: 27 y.o. G2P0101 at 8443w1d here for IOL for A2GDM, TOLAC  Labor: s/p FB, has been on Pitocin, but SVE with minimal change since FB out. Pit at 7620mU/min. AROM risk/benefits discussed; deferred for now.  Fetal Wellbeing:  Cat I Pain Control:  Well-controlled now Anticipated MOD:  Hopeful for VBAC, but prognosis guarded  Frederik PearJulie P Degele, MD 01/22/2018, 7:29 AM

## 2018-01-22 NOTE — Progress Notes (Signed)
Labor Progress Note Elwyn LadeXantae T James is a 27 y.o. G2P1001 at 5155w1d presented for IOL for A2GDM on insulin, TOLAC.  S: Patient continuing to become more uncomfortable with contractions. Offered considering epidural, patient wants to continue with IV pain meds for now.  O:  BP 112/64   Pulse 92   Temp 98.2 F (36.8 C) (Oral)   Resp 14   Ht 4\' 11"  (1.499 m)   Wt 239 lb 11.2 oz (108.7 kg)   LMP 04/23/2017 (Exact Date)   BMI 48.41 kg/m   ZOX:[id number]:baseline rate 145, moderate variability, + acels, no decels Toco: ctx every 2-4 min  CVE: Dilation: 6 Effacement (%): 80 Cervical Position: Anterior Station: -2 Presentation: Vertex Exam by:: Dr. Linwood Dibblesumball   A&P: 27 y.o. G2P1001 5255w1d here for IOL for A2GDM, TOLAC. Labor: Progressing well. Continue titrating with pitocin. Pain: Per patient request. FWB: Cat I Intermittently elevated BP - one severe range pressure noted, asymptomatic. Continue to monitor.  Ellwood DenseAlison Lonzell Dorris, DO 5:00 PM

## 2018-01-22 NOTE — Progress Notes (Signed)
MDA called to bedside for patient requesting epidural for labor analgesia.  R/B/A discussed with patient.  Timeout performed prior to start of procedure.  After local anesthetic injected subcutaneously, touhy needle advanced ~4cm.  Patient complained of pelvic pain and stated her desire to abort the procedure.  Touhy needle removed from patient's back, no bleeding noted.  Patient returned to supine position in bed and care resumed by labor nurse.  EPIDURAL PROCEDURE CANCELLED PER PATIENT REQUEST.  Rondall AllegraS. Turk, MD

## 2018-01-23 ENCOUNTER — Inpatient Hospital Stay (HOSPITAL_COMMUNITY): Payer: Medicaid Other | Admitting: Anesthesiology

## 2018-01-23 ENCOUNTER — Encounter (HOSPITAL_COMMUNITY): Payer: Self-pay

## 2018-01-23 DIAGNOSIS — Z3A39 39 weeks gestation of pregnancy: Secondary | ICD-10-CM

## 2018-01-23 DIAGNOSIS — Z98891 History of uterine scar from previous surgery: Secondary | ICD-10-CM

## 2018-01-23 DIAGNOSIS — O34219 Maternal care for unspecified type scar from previous cesarean delivery: Secondary | ICD-10-CM

## 2018-01-23 DIAGNOSIS — O24429 Gestational diabetes mellitus in childbirth, unspecified control: Secondary | ICD-10-CM

## 2018-01-23 LAB — GLUCOSE, CAPILLARY
Glucose-Capillary: 136 mg/dL — ABNORMAL HIGH (ref 70–99)
Glucose-Capillary: 102 mg/dL — ABNORMAL HIGH (ref 70–99)
Glucose-Capillary: 96 mg/dL (ref 70–99)
Glucose-Capillary: 81 mg/dL (ref 70–99)

## 2018-01-23 MED ORDER — SENNOSIDES-DOCUSATE SODIUM 8.6-50 MG PO TABS
2.0000 | ORAL_TABLET | ORAL | Status: DC
  Administered 2018-01-23 – 2018-01-24 (×2): 2 via ORAL
  Filled 2018-01-23 (×2): qty 2

## 2018-01-23 MED ORDER — COCONUT OIL OIL
1.0000 "application " | TOPICAL_OIL | Status: DC | PRN
  Administered 2018-01-24: 1 via TOPICAL
  Filled 2018-01-23: qty 120

## 2018-01-23 MED ORDER — ACETAMINOPHEN 325 MG PO TABS
650.0000 mg | ORAL_TABLET | ORAL | Status: DC | PRN
  Administered 2018-01-23 – 2018-01-25 (×4): 650 mg via ORAL
  Filled 2018-01-23 (×4): qty 2

## 2018-01-23 MED ORDER — LIDOCAINE HCL (PF) 1 % IJ SOLN
INTRAMUSCULAR | Status: DC | PRN
  Administered 2018-01-23: 5 mL via EPIDURAL

## 2018-01-23 MED ORDER — BENZOCAINE-MENTHOL 20-0.5 % EX AERO
1.0000 "application " | INHALATION_SPRAY | CUTANEOUS | Status: DC | PRN
  Administered 2018-01-23: 1 via TOPICAL
  Filled 2018-01-23: qty 56

## 2018-01-23 MED ORDER — PRENATAL MULTIVITAMIN CH
1.0000 | ORAL_TABLET | Freq: Every day | ORAL | Status: DC
  Administered 2018-01-23 – 2018-01-25 (×3): 1 via ORAL
  Filled 2018-01-23 (×3): qty 1

## 2018-01-23 MED ORDER — TETANUS-DIPHTH-ACELL PERTUSSIS 5-2.5-18.5 LF-MCG/0.5 IM SUSP: 0.5000 mL | Freq: Once | INTRAMUSCULAR | Status: DC

## 2018-01-23 MED ORDER — DIPHENHYDRAMINE HCL 25 MG PO CAPS
25.0000 mg | ORAL_CAPSULE | Freq: Four times a day (QID) | ORAL | Status: DC | PRN
Start: 2018-01-23 — End: 2018-01-25

## 2018-01-23 MED ORDER — SIMETHICONE 80 MG PO CHEW
80.0000 mg | CHEWABLE_TABLET | ORAL | Status: DC | PRN
  Administered 2018-01-23 – 2018-01-25 (×2): 80 mg via ORAL
  Filled 2018-01-23: qty 1

## 2018-01-23 MED ORDER — ONDANSETRON HCL 4 MG/2ML IJ SOLN: 4.0000 mg | INTRAMUSCULAR | Status: DC | PRN

## 2018-01-23 MED ORDER — IBUPROFEN 600 MG PO TABS
600.0000 mg | ORAL_TABLET | Freq: Four times a day (QID) | ORAL | Status: DC
  Administered 2018-01-23 – 2018-01-25 (×10): 600 mg via ORAL
  Filled 2018-01-23 (×10): qty 1

## 2018-01-23 MED ORDER — ZOLPIDEM TARTRATE 5 MG PO TABS: 5.0000 mg | ORAL_TABLET | Freq: Every evening | ORAL | Status: DC | PRN

## 2018-01-23 MED ORDER — DIBUCAINE 1 % RE OINT: 1.0000 "application " | TOPICAL_OINTMENT | RECTAL | Status: DC | PRN

## 2018-01-23 MED ORDER — ONDANSETRON HCL 4 MG PO TABS: 4.0000 mg | ORAL_TABLET | ORAL | Status: DC | PRN

## 2018-01-23 MED ORDER — WITCH HAZEL-GLYCERIN EX PADS: 1.0000 "application " | MEDICATED_PAD | CUTANEOUS | Status: DC | PRN

## 2018-01-23 NOTE — Anesthesia Procedure Notes (Signed)
Epidural Patient location during procedure: OB Start time: 01/23/2018 1:59 AM End time: 01/23/2018 2:16 AM  Staffing Anesthesiologist: Trevor IhaHouser, Catia Todorov A, MD Performed: anesthesiologist   Preanesthetic Checklist Completed: patient identified, site marked, surgical consent, pre-op evaluation, timeout performed, IV checked, risks and benefits discussed and monitors and equipment checked  Epidural Patient position: sitting Prep: site prepped and draped and DuraPrep Patient monitoring: continuous pulse ox and blood pressure Approach: midline Location: L2-L3 Injection technique: LOR air  Needle:  Needle type: Tuohy  Needle gauge: 17 G Needle length: 9 cm and 9 Needle insertion depth: 9 cm Catheter type: closed end flexible Catheter size: 19 Gauge Catheter at skin depth: 15 cm Test dose: negative  Assessment Events: blood not aspirated, injection not painful, no injection resistance, negative IV test and no paresthesia

## 2018-01-23 NOTE — Anesthesia Postprocedure Evaluation (Signed)
Anesthesia Post Note  Patient: Colleen Fields  Procedure(s) Performed: AN AD HOC LABOR EPIDURAL     Patient location during evaluation: PACU Anesthesia Type: Epidural Level of consciousness: awake, awake and alert, oriented and patient cooperative Pain management: satisfactory to patient Vital Signs Assessment: post-procedure vital signs reviewed and stable Respiratory status: spontaneous breathing, nonlabored ventilation and respiratory function stable Cardiovascular status: blood pressure returned to baseline and stable Postop Assessment: no headache, no backache, patient able to bend at knees, no apparent nausea or vomiting, adequate PO intake, epidural receding and able to ambulate Anesthetic complications: no    Last Vitals:  Vitals:   01/23/18 0556 01/23/18 0654  BP: 131/71 106/72  Pulse: 75 79  Resp: 17 17  Temp: 36.8 C 37 C    Last Pain:  Vitals:   01/23/18 0654  TempSrc: Oral  PainSc: 4    Pain Goal:                 Mauricia AreaWINN,Giuseppe Duchemin

## 2018-01-23 NOTE — Progress Notes (Signed)
Labor Progress Note  Colleen Fields is a 27 y.o. G2P1001 at 4551w1d admitted for induction of labor due to Gestational diabetes. TOLAC  S: Contractions causing more pain. Pt requested an epidural due to pain.    O:  BP 124/62   Pulse 94   Temp 98.6 F (37 C) (Oral)   Resp 16   Ht 4\' 11"  (1.499 m)   Wt 108.7 kg (239 lb 11.2 oz)   LMP 04/23/2017 (Exact Date)   BMI 48.41 kg/m   No intake/output data recorded.  FHT:  FHR: 150 bpm, variability: moderate,  accelerations:  Present,  decelerations:  Absent UC:   regular, every 2-4 minutes SVE:   Dilation: 6 Effacement (%): 90 Station: -1, 0 Exam by:: Colleen Bienenstockiana Castillo, RN  Pitocin @ 16 mu/min  Labs: Lab Results  Component Value Date   WBC 8.4 01/22/2018   HGB 11.2 (L) 01/22/2018   HCT 32.2 (L) 01/22/2018   MCV 87.5 01/22/2018   PLT 222 01/22/2018    Assessment / Plan: 27 y.o. G2P1001 7551w1d in early labor Induction of labor due to gestational diabetes,  progressing well on pitocin  TOLAC  Labor: Progressing slowly on Pitocin. will get epidural then place an IUPC to monitor contractin strength better.   GDM: On insulin antepartum,  CBG at 2044 was 101. Cont to monitor with q2 cbgs Fetal Wellbeing:  Category I Pain Control:  Epidural Anticipated MOD:  VBAC  Expectant management  Colleen PinkLexi Star Fields, MS3 01/23/18 1:44 AM

## 2018-01-23 NOTE — Anesthesia Preprocedure Evaluation (Signed)
Anesthesia Evaluation  Patient identified by MRN, date of birth, ID band Patient awake    Reviewed: Allergy & Precautions, NPO status , Patient's Chart, lab work & pertinent test results  Airway Mallampati: III  TM Distance: >3 FB Neck ROM: Full    Dental no notable dental hx. (+) Teeth Intact   Pulmonary neg pulmonary ROS,    Pulmonary exam normal breath sounds clear to auscultation       Cardiovascular negative cardio ROS Normal cardiovascular exam Rhythm:Regular Rate:Normal     Neuro/Psych negative neurological ROS  negative psych ROS   GI/Hepatic   Endo/Other  diabetes, GestationalMorbid obesity  Renal/GU      Musculoskeletal   Abdominal (+) + obese,   Peds  Hematology   Anesthesia Other Findings   Reproductive/Obstetrics (+) Pregnancy                             Lab Results  Component Value Date   WBC 8.4 01/22/2018   HGB 11.2 (L) 01/22/2018   HCT 32.2 (L) 01/22/2018   MCV 87.5 01/22/2018   PLT 222 01/22/2018    Anesthesia Physical Anesthesia Plan  ASA: III  Anesthesia Plan: Epidural   Post-op Pain Management:    Induction:   PONV Risk Score and Plan:   Airway Management Planned:   Additional Equipment:   Intra-op Plan:   Post-operative Plan:   Informed Consent: I have reviewed the patients History and Physical, chart, labs and discussed the procedure including the risks, benefits and alternatives for the proposed anesthesia with the patient or authorized representative who has indicated his/her understanding and acceptance.     Plan Discussed with:   Anesthesia Plan Comments:         Anesthesia Quick Evaluation

## 2018-01-24 LAB — GLUCOSE, CAPILLARY: Glucose-Capillary: 110 mg/dL — ABNORMAL HIGH (ref 70–99)

## 2018-01-24 NOTE — Progress Notes (Addendum)
Patient ID: Colleen Fields, female   DOB: [birth year], 27 y.o.   MRN: [id number] POSTPARTUM PROGRESS NOTE  Post Partum Day 1 Subjective:  Colleen Fields is a 27 y.o. [id number] 6635w2d s/p VBAC following IOL for A2GDM. No acute events overnight.  Pt denies problems with ambulating, voiding or po intake.  She denies nausea or vomiting.  Pain is well controlled. Lochia Small. Pt states she has some tingling in her left leg but tolerable for now.   Objective: Blood pressure 120/60, pulse 87, temperature 98.2 F (36.8 C), temperature source Oral, resp. rate 18, height 4\' 11"  (1.499 m), weight 108.7 kg (239 lb 11.2 oz), last menstrual period 04/23/2017, unknown if currently breastfeeding.  Physical Exam:  General: alert, cooperative and no distress Lochia:normal flow Chest: no respiratory distress Heart:regular rate, distal pulses intact Abdomen: soft, nontender,  Uterine Fundus: firm, appropriately tender DVT Evaluation: No calf swelling or tenderness Extremities: +1 pitting edema, decreased from prior to delivery  Recent Labs    01/21/18 0830 01/22/18 1159  HGB 10.5* 11.2*  HCT 30.1* 32.2*   CBG (last 3)  Recent Labs    01/23/18 1149 01/23/18 1714 01/23/18 2226  GLUCAP 102* 96 81    Assessment/Plan:  ASSESSMENT: Colleen Fields is a 27 y.o. [id number] 7435w2d s/p VBAC following IOl for A2GDM.  Plan for discharge tomorrow, Breastfeeding and Contraception POPs Circumcision (Outpatient)   LOS: 3 days   Rolland BimlerLexi J Betancourt, MS3 01/24/2018, 7:46 AM   OB FELLOW MEDICAL STUDENT NOTE ATTESTATION  I confirm that I have verified the information documented in the medical student's note and that I have also personally performed the physical exam and all medical decision making activities.   Frederik PearJulie P Degele, MD OB Fellow

## 2018-01-24 NOTE — Lactation Note (Signed)
This note was copied from a baby's chart. Lactation Consultation Note Baby 20 hrs old. Baby was nursing on Rt. Breast when LC entered rm, Mom stated her nipple was hurting. Noted bruise and small raw area. Comfort gels given. Mom had just been feeding in cradle and side lying once. Demonstrated football hold. Mom liked that position. Discussed importance of comfort and support. Demonstrated chin tug to widen latch. Mom stated felt no pain.  Mom BF her 1st child for over 2 yrs. Mom has "V" shaped breast w/short shaft nipple at the bottom of the nipple. Has colostrum encouraged to rub on nipple.  Mom stated felt much better. Newborn behavior, STS, I&O discussed. Mom encouraged to feed baby 8-12 times/24 hours and with feeding cues.  [facility] brochure given w/resources, support groups and LC services.  Patient Name: Colleen Verlin DikeXantae James ZOXWR'UToday's Date: 01/24/2018 Reason for consult: Initial assessment   Maternal Data Has patient been taught Hand Expression?: Yes Does the patient have breastfeeding experience prior to this delivery?: Yes  Feeding Feeding Type: Breast Fed Length of feed: 10 min  LATCH Score Latch: Grasps breast easily, tongue down, lips flanged, rhythmical sucking.  Audible Swallowing: A few with stimulation  Type of Nipple: Everted at rest and after stimulation(short shaft)  Comfort (Breast/Nipple): Filling, red/small blisters or bruises, mild/mod discomfort  Hold (Positioning): Assistance needed to correctly position infant at breast and maintain latch.  LATCH Score: 7  Interventions Interventions: Breast feeding basics reviewed;Support pillows;Assisted with latch;Position options;Skin to skin;Expressed milk;Breast massage;Hand express;Comfort gels;Breast compression;Adjust position  Lactation Tools Discussed/Used     Consult Status Consult Status: Follow-up Date: 01/25/18 Follow-up type: In-patient    Charyl DancerCARVER, Aariyah Sampey G 01/24/2018, 12:14 AM

## 2018-01-25 ENCOUNTER — Encounter (HOSPITAL_COMMUNITY): Payer: Self-pay

## 2018-01-25 MED ORDER — IBUPROFEN 600 MG PO TABS: 600.0000 mg | ORAL_TABLET | Freq: Four times a day (QID) | ORAL | 0 refills | Status: DC

## 2018-01-25 NOTE — Discharge Instructions (Signed)
Vaginal Delivery, Care After °Refer to this sheet in the next few weeks. These instructions provide you with information about caring for yourself after vaginal delivery. Your health care provider may also give you more specific instructions. Your treatment has been planned according to current medical practices, but problems sometimes occur. Call your health care provider if you have any problems or questions. °What can I expect after the procedure? °After vaginal delivery, it is common to have: °· Some bleeding from your vagina. °· Soreness in your abdomen, your vagina, and the area of skin between your vaginal opening and your anus (perineum). °· Pelvic cramps. °· Fatigue. ° °Follow these instructions at home: °Medicines °· Take over-the-counter and prescription medicines only as told by your health care provider. °· If you were prescribed an antibiotic medicine, take it as told by your health care provider. Do not stop taking the antibiotic until it is finished. °Driving ° °· Do not drive or operate heavy machinery while taking prescription pain medicine. °· Do not drive for 24 hours if you received a sedative. °Lifestyle °· Do not drink alcohol. This is especially important if you are breastfeeding or taking medicine to relieve pain. °· Do not use tobacco products, including cigarettes, chewing tobacco, or e-cigarettes. If you need help quitting, ask your health care provider. °Eating and drinking °· Drink at least 8 eight-ounce glasses of water every day unless you are told not to by your health care provider. If you choose to breastfeed your baby, you may need to drink more water than this. °· Eat high-fiber foods every day. These foods may help prevent or relieve constipation. High-fiber foods include: °? Whole grain cereals and breads. °? Brown rice. °? Beans. °? Fresh fruits and vegetables. °Activity °· Return to your normal activities as told by your health care provider. Ask your health care provider  what activities are safe for you. °· Rest as much as possible. Try to rest or take a nap when your baby is sleeping. °· Do not lift anything that is heavier than your baby or 10 lb (4.5 kg) until your health care provider says that it is safe. °· Talk with your health care provider about when you can engage in sexual activity. This may depend on your: °? Risk of infection. °? Rate of healing. °? Comfort and desire to engage in sexual activity. °Vaginal Care °· If you have an episiotomy or a vaginal tear, check the area every day for signs of infection. Check for: °? More redness, swelling, or pain. °? More fluid or blood. °? Warmth. °? Pus or a bad smell. °· Do not use tampons or douches until your health care provider says this is safe. °· Watch for any blood clots that may pass from your vagina. These may look like clumps of dark red, brown, or black discharge. °General instructions °· Keep your perineum clean and dry as told by your health care provider. °· Wear loose, comfortable clothing. °· Wipe from front to back when you use the toilet. °· Ask your health care provider if you can shower or take a bath. If you had an episiotomy or a perineal tear during labor and delivery, your health care provider may tell you not to take baths for a certain length of time. °· Wear a bra that supports your breasts and fits you well. °· If possible, have someone help you with household activities and help care for your baby for at least a few days after   you leave the hospital. °· Keep all follow-up visits for you and your baby as told by your health care provider. This is important. °Contact a health care provider if: °· You have: °? Vaginal discharge that has a bad smell. °? Difficulty urinating. °? Pain when urinating. °? A sudden increase or decrease in the frequency of your bowel movements. °? More redness, swelling, or pain around your episiotomy or vaginal tear. °? More fluid or blood coming from your episiotomy or  vaginal tear. °? Pus or a bad smell coming from your episiotomy or vaginal tear. °? A fever. °? A rash. °? Little or no interest in activities you used to enjoy. °? Questions about caring for yourself or your baby. °· Your episiotomy or vaginal tear feels warm to the touch. °· Your episiotomy or vaginal tear is separating or does not appear to be healing. °· Your breasts are painful, hard, or turn red. °· You feel unusually sad or worried. °· You feel nauseous or you vomit. °· You pass large blood clots from your vagina. If you pass a blood clot from your vagina, save it to show to your health care provider. Do not flush blood clots down the toilet without having your health care provider look at them. °· You urinate more than usual. °· You are dizzy or light-headed. °· You have not breastfed at all and you have not had a menstrual period for 12 weeks after delivery. °· You have stopped breastfeeding and you have not had a menstrual period for 12 weeks after you stopped breastfeeding. °Get help right away if: °· You have: °? Pain that does not go away or does not get better with medicine. °? Chest pain. °? Difficulty breathing. °? Blurred vision or spots in your vision. °? Thoughts about hurting yourself or your baby. °· You develop pain in your abdomen or in one of your legs. °· You develop a severe headache. °· You faint. °· You bleed from your vagina so much that you fill two sanitary pads in one hour. °This information is not intended to replace advice given to you by your health care provider. Make sure you discuss any questions you have with your health care provider. °Document Released: 06/26/2000 Document Revised: 12/11/2015 Document Reviewed: 07/14/2015 °Elsevier Interactive Patient Education © 2018 [employer] ° °

## 2018-01-25 NOTE — Discharge Summary (Signed)
OB Discharge Summary     Patient Name: Colleen Fields DOB: 11/05/1990 MRN: [id number]  Date of admission: 01/21/2018 Delivering MD: Katheren Shams   Date of discharge: 01/25/2018  Admitting diagnosis: 39wks induction Intrauterine pregnancy: [redacted]w[redacted]d    Secondary diagnosis:  Active Problems:   Labor and delivery indication for care or intervention   SVD (spontaneous vaginal delivery)  Additional problems: VBAC, GDM, obesity, hx of depression     Discharge diagnosis: Term Pregnancy Delivered, VBAC and GDM A2                                                                                                Post partum procedures:none  Augmentation: AROM, Pitocin and Foley Balloon  Complications: None  Hospital course:  Induction of Labor With Vaginal Delivery   27y.o. yo G2P2002 at 341w2das admitted to the hospital 01/21/2018 for induction of labor.  Indication for induction: A2 DM.  Patient had an uncomplicated labor course as follows: Membrane Rupture Time/Date: 12:10 AM ,01/23/2018   Intrapartum Procedures: Episiotomy: None [1]                                         Lacerations:  Sulcus [9]  Patient had delivery of a Viable infant.  Information for the patient's newborn:  JaIkhlas, Albo0[[id number]]Delivery Method: Vag-Spont   01/23/2018  Details of delivery can be found in separate delivery note.  Patient had a routine postpartum course. Patient is discharged home 01/25/18.  Physical exam  Vitals:   01/23/18 1547 01/23/18 2221 01/24/18 1441 01/24/18 2148  BP: 137/70 120/60 126/66 134/79  Pulse:  87 96 85  Resp: '18 18 18   '$ Temp: 97.6 F (36.4 C) 98.2 F (36.8 C)  97.7 F (36.5 C)  TempSrc: Oral Oral  Oral  SpO2:   100%   Weight:      Height:       General: alert, cooperative and no distress Lochia: appropriate Uterine Fundus: firm Incision: N/A DVT Evaluation: No evidence of DVT seen on physical exam. Labs: Lab Results  Component Value Date   WBC 8.4  01/22/2018   HGB 11.2 (L) 01/22/2018   HCT 32.2 (L) 01/22/2018   MCV 87.5 01/22/2018   PLT 222 01/22/2018   CMP Latest Ref Rng & Units 01/22/2018  Glucose 70 - 99 mg/dL 113(H)  BUN 6 - 20 mg/dL <5(L)  Creatinine 0.44 - 1.00 mg/dL 0.47  Sodium 135 - 145 mmol/L 134(L)  Potassium 3.5 - 5.1 mmol/L 3.5  Chloride 98 - 111 mmol/L 104  CO2 22 - 32 mmol/L 18(L)  Calcium 8.9 - 10.3 mg/dL 9.2  Total Protein 6.5 - 8.1 g/dL 6.2(L)  Total Bilirubin 0.3 - 1.2 mg/dL 0.9  Alkaline Phos 38 - 126 U/L 83  AST 15 - 41 U/L 21  ALT 0 - 44 U/L 15    Discharge instruction: per After Visit Summary and "Baby and Me Booklet".  After visit meds:  Allergies as of 01/25/2018      Reactions   Latex Itching, Other (See Comments)   Causes burning.      Medication List    STOP taking these medications   ACCU-CHEK FASTCLIX LANCETS Misc   ACCU-CHEK GUIDE w/Device Kit   calcium carbonate 500 MG chewable tablet Commonly known as:  TUMS - dosed in mg elemental calcium   glucose blood test strip Commonly known as:  ACCU-CHEK GUIDE   insulin NPH Human 100 UNIT/ML injection Commonly known as:  HUMULIN N,NOVOLIN N     TAKE these medications   diclofenac sodium 1 % Gel Commonly known as:  VOLTAREN Apply 2 g topically 2 (two) times daily as needed.   ferrous sulfate 325 (65 FE) MG tablet Take 325 mg by mouth daily with breakfast.   ibuprofen 600 MG tablet Commonly known as:  ADVIL,MOTRIN Take 1 tablet (600 mg total) by mouth every 6 (six) hours.   prenatal multivitamin Tabs tablet Take 1 tablet by mouth daily at 12 noon.       Diet: routine diet  Activity: Advance as tolerated. Pelvic rest for 6 weeks.   Outpatient follow up:4 weeks Follow up Appt: Future Appointments  Date Time Provider [place]  02/10/2018  2:30 PM Ilean China [facility]  02/16/2018  1:35 PM Anyanwu, Sallyanne Havers, MD [facility]  03/02/2018  8:20 AM [facility]   Follow up Visit:No  follow-ups on file.  Postpartum contraception: None  Newborn Data: Live born female  Birth Weight: 7 lb 1.1 oz (3206 g) APGAR: 7, 9  Newborn Delivery   Birth date/time:  01/23/2018 03:32:00 Delivery type:  VBAC, Spontaneous     Baby Feeding: Breast Disposition:home with mother   01/25/2018 Benay Pike, MD

## 2018-01-25 NOTE — Lactation Note (Signed)
This note was copied from a baby's chart. Lactation Consultation Note  Patient Name: Colleen Verlin DikeXantae James ZOXWR'UToday's Date: 01/25/2018 Reason for consult: Follow-up assessment;Term Type of Endocrine Disorder?: Diabetes  P2, 49 hour female infant , 6% weight loss,  DM mom. Mom is experienced BF, BF daughter one year. Mom has larger breast with  short shaft nipples, pre-pumped and breast shells given. Discussed latch positions w/ mom , mom mostly used cradle position. Mom latch infant  to right breast using football hold , heard audible swallowing with deep latch and compression. Mom expressed comfort using football hold position. Mom breastfeed for 15 minutes on right breast. Reviewed hand expression, mom expressed 2.535ml of BM, MGM spoon feed to infant. Engorgement treatment and prevention discussed. Mom made aware of O/P services, breastfeeding support groups, community resources, and our phone # for post-discharge questions.    Maternal Data Has patient been taught Hand Expression?: Yes Does the patient have breastfeeding experience prior to this delivery?: Yes  Feeding Feeding Type: Breast Fed Length of feed: 15 min  LATCH Score Latch: Grasps breast easily, tongue down, lips flanged, rhythmical sucking.  Audible Swallowing: Spontaneous and intermittent  Type of Nipple: Everted at rest and after stimulation(short shaft nipple )  Comfort (Breast/Nipple): Soft / non-tender  Hold (Positioning): Assistance needed to correctly position infant at breast and maintain latch.  LATCH Score: 9  Interventions Interventions: Assisted with latch;Breast feeding basics reviewed;Skin to skin;Breast massage;Hand express;Pre-pump if needed;Breast compression;Adjust position;Support pillows;Expressed milk;Hand pump;Shells  Lactation Tools Discussed/Used [facility] Pump Review: Setup, frequency, and cleaning Initiated by:: [employer] Antha Niday. IBCLC Date initiated::  01/25/18   Consult Status Consult Status: Complete Date: 01/25/18    Danelle EarthlyRobin Burkley Dech 01/25/2018, 6:30 AM

## 2018-01-26 ENCOUNTER — Encounter: Payer: Self-pay | Admitting: Obstetrics and Gynecology

## 2018-01-26 NOTE — Progress Notes (Signed)
CSW received consult due to score of 13 on the Edinburgh Depression Screen.  CSW notes that her answer to thoughts of harming self was "sometimes." MOB was pleasant and welcoming of CSW's visit.  Her sister and 28 year old daughter were asleep on the couch when CSW arrived and her mother was sitting in the chair.  MOB was breast feeding baby.  She states that we could speak openly with her family in the room.   MOB states she is feeling well and that she is happy that baby is feeding well.  She states she feels ready to go home and that she has a great support system.  She acknowledges current feelings of anxiety, but does not feel like her symptoms are interfering with her daily life at this time.  She states she has tried 3 different kinds of medication in the past to treat anxiety and depression and has "not had a good experience."  She is not interested in medication, but is potentially interested in counseling.  CSW provided resources.  MOB was able to identify positive coping mechanisms that she employs to help calm herself when she feels anxious such as listening to the Bible (especially Psalms), and being mindful to stop her mind from racing when she feels it racing.  She also uses "the calm app."  She states she saw [facility] at one time during pregnancy and feels comfortable talking with her again if she feels this is needed.   CSW assessed for safety.  MOB denies any feelings of SI/HI now or in the past.  She seemed unaware that she put any thoughts of harming herself on the Edinburgh Postnatal Depression Scale screening tool.  MOB easily contracted for safety if SI occurs in the future. CSW provided education regarding Baby Blues vs PMADs and provided MOB with information about support groups held at [facility].  CSW encouraged MOB to evaluate her mental health throughout the postpartum period with the use of the New Mom Checklist developed by [employer]partum [employer], as well as the Edinburgh  Postnatal Depression Scale and notify a medical professional if symptoms arise.   

## 2018-01-28 ENCOUNTER — Inpatient Hospital Stay (HOSPITAL_COMMUNITY): Payer: Medicaid Other

## 2018-01-28 ENCOUNTER — Inpatient Hospital Stay (HOSPITAL_COMMUNITY)
Admission: AD | Admit: 2018-01-28 | Discharge: 2018-01-28 | Disposition: A | Discharge status: Home / Self Care | Payer: Medicaid Other | Source: Ambulatory Visit | Attending: Obstetrics & Gynecology | Admitting: Obstetrics & Gynecology

## 2018-01-28 ENCOUNTER — Encounter (HOSPITAL_COMMUNITY): Payer: Self-pay

## 2018-01-28 DIAGNOSIS — R079 Chest pain, unspecified: Secondary | ICD-10-CM | POA: Insufficient documentation

## 2018-01-28 DIAGNOSIS — Z8249 Family history of ischemic heart disease and other diseases of the circulatory system: Secondary | ICD-10-CM | POA: Insufficient documentation

## 2018-01-28 DIAGNOSIS — R002 Palpitations: Secondary | ICD-10-CM | POA: Insufficient documentation

## 2018-01-28 DIAGNOSIS — Z8632 Personal history of gestational diabetes: Secondary | ICD-10-CM | POA: Diagnosis not present

## 2018-01-28 DIAGNOSIS — R0602 Shortness of breath: Secondary | ICD-10-CM | POA: Insufficient documentation

## 2018-01-28 DIAGNOSIS — Z91040 Latex allergy status: Secondary | ICD-10-CM | POA: Diagnosis not present

## 2018-01-28 DIAGNOSIS — R51 Headache: Secondary | ICD-10-CM | POA: Diagnosis not present

## 2018-01-28 DIAGNOSIS — Z818 Family history of other mental and behavioral disorders: Secondary | ICD-10-CM | POA: Insufficient documentation

## 2018-01-28 DIAGNOSIS — R000 Tachycardia, unspecified: Secondary | ICD-10-CM | POA: Insufficient documentation

## 2018-01-28 DIAGNOSIS — O1205 Gestational edema, complicating the puerperium: Secondary | ICD-10-CM

## 2018-01-28 HISTORY — DX: Tachycardia, unspecified: R00.0

## 2018-01-28 HISTORY — DX: Chest pain, unspecified: R07.9

## 2018-01-28 HISTORY — DX: Palpitations: R00.2

## 2018-01-28 LAB — URINALYSIS, MICROSCOPIC (REFLEX): RBC / HPF: NONE SEEN RBC/hpf (ref 0–5)

## 2018-01-28 LAB — COMPREHENSIVE METABOLIC PANEL
ALT: 16 U/L (ref 0–44)
AST: 15 U/L (ref 15–41)
Albumin: 2.6 g/dL — ABNORMAL LOW (ref 3.5–5.0)
Alkaline Phosphatase: 64 U/L (ref 38–126)
Anion gap: 10 (ref 5–15)
BUN: 9 mg/dL (ref 6–20)
CO2: 23 mmol/L (ref 22–32)
Calcium: 8.6 mg/dL — ABNORMAL LOW (ref 8.9–10.3)
Chloride: 104 mmol/L (ref 98–111)
Creatinine, Ser: 0.63 mg/dL (ref 0.44–1.00)
GFR calc Af Amer: >60 mL/min (ref >60)
GFR calc non Af Amer: >60 mL/min (ref >60)
Glucose, Bld: 94 mg/dL (ref 70–99)
Potassium: 3.4 mmol/L — ABNORMAL LOW (ref 3.5–5.1)
Sodium: 137 mmol/L (ref 135–145)
Total Bilirubin: 0.5 mg/dL (ref 0.3–1.2)
Total Protein: 6.3 g/dL — ABNORMAL LOW (ref 6.5–8.1)

## 2018-01-28 LAB — CBC
HCT: 27.2 % — ABNORMAL LOW (ref 36.0–46.0)
Hemoglobin: 9.4 g/dL — ABNORMAL LOW (ref 12.0–15.0)
MCH: 30.3 pg (ref 26.0–34.0)
MCHC: 34.6 g/dL (ref 30.0–36.0)
MCV: 87.7 fL (ref 78.0–100.0)
Platelets: 243 10*3/uL (ref 150–400)
RBC: 3.1 MIL/uL — ABNORMAL LOW (ref 3.87–5.11)
RDW: 14.7 % (ref 11.5–15.5)
WBC: 5.5 10*3/uL (ref 4.0–10.5)

## 2018-01-28 LAB — PROTEIN / CREATININE RATIO, URINE
Creatinine, Urine: 167 mg/dL
Protein Creatinine Ratio: 0.08 mg/mg{Cre} (ref 0.00–0.15)
Total Protein, Urine: 14 mg/dL

## 2018-01-28 LAB — URINALYSIS, ROUTINE W REFLEX MICROSCOPIC
Bilirubin Urine: NEGATIVE
Glucose, UA: NEGATIVE mg/dL
Ketones, ur: 40 mg/dL — AB
Nitrite: NEGATIVE
Protein, ur: NEGATIVE mg/dL
Specific Gravity, Urine: 1.02 (ref 1.005–1.030)
pH: 6 (ref 5.0–8.0)

## 2018-01-28 MED ORDER — SODIUM CHLORIDE 0.9 % IV SOLN
INTRAVENOUS | Status: DC
  Administered 2018-01-28: 08:00:00 via INTRAVENOUS

## 2018-01-28 MED ORDER — FUROSEMIDE 20 MG PO TABS
20.0000 mg | ORAL_TABLET | Freq: Once | ORAL | Status: AC
  Administered 2018-01-28: 20 mg via ORAL
  Filled 2018-01-28: qty 1

## 2018-01-28 MED ORDER — HYDROCHLOROTHIAZIDE 25 MG PO TABS: 25.0000 mg | ORAL_TABLET | Freq: Every day | ORAL | 0 refills | Status: DC

## 2018-01-28 MED ORDER — IBUPROFEN 600 MG PO TABS
600.0000 mg | ORAL_TABLET | Freq: Once | ORAL | Status: AC
  Administered 2018-01-28: 600 mg via ORAL
  Filled 2018-01-28: qty 1

## 2018-01-28 MED ORDER — IOPAMIDOL (ISOVUE-370) INJECTION 76%
100.0000 mL | Freq: Once | INTRAVENOUS | Status: AC | PRN
  Administered 2018-01-28: 100 mL via INTRAVENOUS

## 2018-01-28 MED ORDER — SODIUM CHLORIDE 0.9 % IV SOLN: INTRAVENOUS | Status: DC

## 2018-01-28 NOTE — MAU Note (Signed)
Pt states before she left the hospital she feels like her chest is pounding when she wakes up. States tonight she felt a pain in her chest and like her heart is racing. Reports swelling in her lower legs, tingling in her arms.

## 2018-01-28 NOTE — MAU Provider Note (Addendum)
Chief Complaint:  Chest Pain and Palpitations   First Provider Initiated Contact with Patient 01/28/18 0610      HPI: Colleen Fields is a 27 y.o. [id number] who presents to maternity admissions reporting chest pain, heart pounding, leg swelling, and headache.  Has had headache for a week or two   . She reports vaginal bleeding, but no vaginal itching/burning, urinary symptoms, dizziness, n/v, or fever/chills.   Had a vaginal delivery on 01/23/18 after being induced for Gestational Diabetes at term.   Blood pressures on discharge were: 137/70 120/60 126/66 134/79   Is breastfeeding without problems.    Chest Pain   This is a new problem. The current episode started today. The problem occurs constantly. The problem has been unchanged. The pain is present in the substernal region. The quality of the pain is described as pressure and sharp. The pain does not radiate. Associated symptoms include headaches, lower extremity edema, palpitations and shortness of breath. Pertinent negatives include no abdominal pain, dizziness, fever, leg pain, nausea, syncope or vomiting. The pain is aggravated by nothing. She has tried nothing for the symptoms. Risk factors include obesity (recent pregnancy).  Palpitations   This is a new problem. The current episode started in the past 7 days. The problem occurs intermittently. Nothing aggravates the symptoms. Associated symptoms include chest pain and shortness of breath. Pertinent negatives include no dizziness, fever, nausea, syncope or vomiting. She has tried nothing for the symptoms.   RN Note: Pt states before she left the hospital she feels like her chest is pounding when she wakes up. States tonight she felt a pain in her chest and like her heart is racing. Reports swelling in her lower legs, tingling in her arms.     Past Medical History: Past Medical History:  Diagnosis Date  . Complication of anesthesia    problems with epidural anesthesia during cs  .  Depression   . Gestational diabetes    insulin  . Migraine     Past obstetric history: OB History  Gravida Para Term Preterm AB Living  2 2 2  0   2  SAB TAB Ectopic Multiple Live Births        0 2    # Outcome Date GA Lbr Len/2nd Weight Sex Delivery Anes PTL Lv  2 Term 01/23/18 [redacted]w[redacted]d / 00:17 7 lb 1.1 oz (3.206 kg) M VBAC EPI  LIV  1 Term 12/03/09   6 lb 12 oz (3.062 kg) F CS-LTranv Spinal  LIV     Complications: Failure to Progress in First Stage    Past Surgical History: Past Surgical History:  Procedure Laterality Date  . CESAREAN SECTION    . WISDOM TOOTH EXTRACTION      Family History: Family History  Problem Relation Age of Onset  . Hypertension Mother   . Bipolar disorder Father   . Schizophrenia Father     Social History: Social History   Tobacco Use  . Smoking status: Never Smoker  . Smokeless tobacco: Never Used  Substance Use Topics  . Alcohol use: No  . Drug use: No    Types: Marijuana    Comment: "hasn't smoked in forever"    Allergies:  Allergies  Allergen Reactions  . Latex Itching and Other (See Comments)    Causes burning.    Meds:  Medications Prior to Admission  Medication Sig Dispense Refill Last Dose  . ibuprofen (ADVIL,MOTRIN) 600 MG tablet Take 1 tablet (600 mg total) by mouth  every 6 (six) hours. 30 tablet 0 01/28/2018 at Unknown time  . diclofenac sodium (VOLTAREN) 1 % GEL Apply 2 g topically 2 (two) times daily as needed. 1 Tube 0 Past Week at Unknown time  . ferrous sulfate 325 (65 FE) MG tablet Take 325 mg by mouth daily with breakfast.   01/21/2018 at Unknown time  . Prenatal Vit-Fe Fumarate-FA (PRENATAL MULTIVITAMIN) TABS tablet Take 1 tablet by mouth daily at 12 noon.   01/21/2018 at Unknown time    I have reviewed patient's Past Medical Hx, Surgical Hx, Family Hx, Social Hx, medications and allergies.  ROS:  Review of Systems  Constitutional: Negative for fever.  Respiratory: Positive for shortness of breath.    Cardiovascular: Positive for chest pain and palpitations. Negative for syncope.  Gastrointestinal: Negative for abdominal pain, nausea and vomiting.  Neurological: Positive for headaches. Negative for dizziness.   Other systems negative    Physical Exam   Patient Vitals for the past 24 hrs:  BP Temp Temp src Pulse Resp SpO2 Height Weight  01/28/18 0556 (!) 147/82 97.9 F (36.6 C) Oral 63 18 100 % 4\' 11"  (1.499 m) 232 lb (105.2 kg)   Vitals:   01/28/18 0616 01/28/18 0631 01/28/18 0646 01/28/18 0701  BP: (!) 143/75 123/69 134/66 (!) 145/63  Pulse: 63 66 79 69  Resp:      Temp:      TempSrc:      SpO2:      Weight:      Height:        Constitutional: Well-developed, well-nourished female in no acute distress.  Cardiovascular: normal rate and rhythm, no ectopy audible, S1 & S2 heard, no murmur Respiratory: normal effort, no distress. Lungs CTAB with no wheezes or crackles  Does not appear dyspneic GI: Abd soft, non-tender.  Nondistended.  No rebound, No guarding.  Bowel Sounds audible  MS: Extremities nontender, 1+2+ LE edema, normal ROM Neurologic: Alert and oriented x 4.   Grossly nonfocal. GU: Neg CVAT. Skin:  Warm and Dry Psych:  Affect appropriate.  PELVIC EXAM: Deferred   Labs: --/--/O POS, O POS Performed at [facility], [address] [place], [place] [id number]  (07/12 0830) Results for orders placed or performed during the hospital encounter of 01/28/18 (from the past 24 hour(s))  CBC     Status: Abnormal   Collection Time: 01/28/18  6:37 AM  Result Value Ref Range   WBC 5.5 4.0 - 10.5 K/uL   RBC 3.10 (L) 3.87 - 5.11 MIL/uL   Hemoglobin 9.4 (L) 12.0 - 15.0 g/dL   HCT 60.427.2 (L) 54.036.0 - 98.146.0 %   MCV 87.7 78.0 - 100.0 fL   MCH 30.3 26.0 - 34.0 pg   MCHC 34.6 30.0 - 36.0 g/dL   RDW 19.114.7 47.811.5 - 29.515.5 %   Platelets 243 150 - 400 K/uL  Comprehensive metabolic panel     Status: Abnormal   Collection Time: 01/28/18  6:37 AM  Result Value Ref Range   Sodium  137 135 - 145 mmol/L   Potassium 3.4 (L) 3.5 - 5.1 mmol/L   Chloride 104 98 - 111 mmol/L   CO2 23 22 - 32 mmol/L   Glucose, Bld 94 70 - 99 mg/dL   BUN 9 6 - 20 mg/dL   Creatinine, Ser 6.210.63 0.44 - 1.00 mg/dL   Calcium 8.6 (L) 8.9 - 10.3 mg/dL   Total Protein 6.3 (L) 6.5 - 8.1 g/dL   Albumin 2.6 (L) 3.5 -  5.0 g/dL   AST 15 15 - 41 U/L   ALT 16 0 - 44 U/L   Alkaline Phosphatase 64 38 - 126 U/L   Total Bilirubin 0.5 0.3 - 1.2 mg/dL   GFR calc non Af Amer >60 >60 mL/min   GFR calc Af Amer >60 >60 mL/min   Anion gap 10 5 - 15  Urinalysis, Routine w reflex microscopic     Status: Abnormal   Collection Time: 01/28/18  7:02 AM  Result Value Ref Range   Color, Urine YELLOW YELLOW   APPearance CLEAR CLEAR   Specific Gravity, Urine 1.020 1.005 - 1.030   pH 6.0 5.0 - 8.0   Glucose, UA NEGATIVE NEGATIVE mg/dL   Hgb urine dipstick TRACE (A) NEGATIVE   Bilirubin Urine NEGATIVE NEGATIVE   Ketones, ur 40 (A) NEGATIVE mg/dL   Protein, ur NEGATIVE NEGATIVE mg/dL   Nitrite NEGATIVE NEGATIVE   Leukocytes, UA SMALL (A) NEGATIVE  Urinalysis, Microscopic (reflex)     Status: Abnormal   Collection Time: 01/28/18  7:02 AM  Result Value Ref Range   RBC / HPF NONE SEEN 0 - 5 RBC/hpf   WBC, UA 6-10 0 - 5 WBC/hpf   Bacteria, UA RARE (A) NONE SEEN   Squamous Epithelial / LPF 0-5 0 - 5   Mucus PRESENT     Imaging:  CXR and CT Angio Dg Chest 2 View  Result Date: 01/28/2018 CLINICAL DATA:  Shortness of breath, chest pain EXAM: CHEST - 2 VIEW COMPARISON:  None. FINDINGS: Heart and mediastinal contours are within normal limits. No focal opacities or effusions. No acute bony abnormality. IMPRESSION: No active cardiopulmonary disease. Electronically Signed   By: Charlett Nose M.D.   On: 01/28/2018 07:58   Ct Angio Chest Pe W Or Wo Contrast  Result Date: 01/28/2018 CLINICAL DATA:  Postpartum shortness of breath and chest pain. Evaluate for pulmonary embolism. EXAM: CT ANGIOGRAPHY CHEST WITH CONTRAST  TECHNIQUE: Multidetector CT imaging of the chest was performed using the standard protocol during bolus administration of intravenous contrast. Multiplanar CT image reconstructions and MIPs were obtained to evaluate the vascular anatomy. CONTRAST:  ISOVUE-370 IOPAMIDOL (ISOVUE-370) INJECTION 76% COMPARISON:  Chest radiograph-earlier same day FINDINGS: Vascular Findings: There is adequate opacification of the pulmonary arterial system with the main pulmonary artery measuring 280 Hounsfield units. There are no discrete filling defects within the pulmonary arterial tree to suggest pulmonary embolism. Normal caliber of the main pulmonary artery. Normal heart size. No evidence of thoracic aortic aneurysm or dissection on this nongated examination. Bovine configuration of the aortic arch. The branch vessels of the aortic arch appear widely patent throughout their imaged course. Review of the MIP images confirms the above findings. ---------------------------------------------------------------------------------- Nonvascular Findings: Mediastinum/Lymph Nodes: Ill-defined soft tissue within the anterior mediastinum is favored to represent residual thymic tissue. No bulky mediastinal, hilar or axillary lymphadenopathy. Lungs/Pleura: Minimal dependent subpleural ground-glass atelectasis. No focal airspace opacities. No pleural effusion or pneumothorax. The central pulmonary airways appear widely patent. No discrete pulmonary nodules. Upper abdomen: Limited early arterial phase evaluation of the upper abdomen is unremarkable. Musculoskeletal: No acute or aggressive osseous abnormalities. Regional soft tissues appear normal. Normal appearance of the thyroid gland. IMPRESSION: No acute cardiopulmonary disease. Specifically, no evidence of pulmonary embolism. Electronically Signed   By: Simonne Come M.D.   On: 01/28/2018 09:19    MAU Course/MDM: I have ordered labs as follows:  Preeclampsia labs Imaging ordered: Chest  Xray and  CT Chest angiogram to  rule out PE Results reviewed.   Consult Dr Erin Fulling who recommends CT to rule out PE and Lasix for edema. . If preeclampsia labs normal, would not treat as preeclampsia (BP only borderline on a few values, no diastolic hypertension). Would give Lasix for the edema.  Treatments in MAU included PO Lasix for edema.  .     Assessment: Normotensive except for a few borderline elevations (140s systolic only) Chest pain  Shortness of breath Postpartum x 5 days Intermittent headache, prolonged since before delivery  Plan: Report given to oncoming provider   Wynelle Bourgeois CNM, MSN Certified Nurse-Midwife 01/28/2018 6:10 AM   Discussed normal results with patient. Advised that Postpartum edema is likely the cause of her chest discomfort and SOB; which are decreasing over time. Educated that HCTZ will help alleviate the increased fluid, but she will need to drink plenty of fluids while taking HCTZ. Patient verbalized an understanding of the plan of care and agrees.   Raelyn Mora, CNM  01/28/2018 12:37 PM

## 2018-02-04 ENCOUNTER — Ambulatory Visit: Payer: Self-pay

## 2018-02-07 ENCOUNTER — Encounter: Payer: Self-pay | Admitting: Obstetrics & Gynecology

## 2018-02-10 ENCOUNTER — Ambulatory Visit: Payer: Self-pay | Admitting: Cardiology

## 2018-02-11 ENCOUNTER — Encounter: Payer: Self-pay | Admitting: *Deleted

## 2018-02-16 ENCOUNTER — Ambulatory Visit (INDEPENDENT_AMBULATORY_CARE_PROVIDER_SITE_OTHER): Payer: Medicaid Other | Admitting: Obstetrics & Gynecology

## 2018-02-16 ENCOUNTER — Encounter: Payer: Self-pay | Admitting: Obstetrics & Gynecology

## 2018-02-16 DIAGNOSIS — N898 Other specified noninflammatory disorders of vagina: Secondary | ICD-10-CM

## 2018-02-16 DIAGNOSIS — Z1389 Encounter for screening for other disorder: Secondary | ICD-10-CM | POA: Diagnosis not present

## 2018-02-16 DIAGNOSIS — Z8632 Personal history of gestational diabetes: Secondary | ICD-10-CM | POA: Diagnosis not present

## 2018-02-16 DIAGNOSIS — O99345 Other mental disorders complicating the puerperium: Secondary | ICD-10-CM | POA: Diagnosis not present

## 2018-02-16 DIAGNOSIS — F53 Postpartum depression: Secondary | ICD-10-CM | POA: Diagnosis not present

## 2018-02-16 DIAGNOSIS — Z309 Encounter for contraceptive management, unspecified: Secondary | ICD-10-CM | POA: Diagnosis not present

## 2018-02-16 MED ORDER — NORETHINDRONE 0.35 MG PO TABS: 1.0000 | ORAL_TABLET | Freq: Every day | ORAL | 11 refills | Status: DC

## 2018-02-16 MED ORDER — IBUPROFEN 600 MG PO TABS: 600.0000 mg | ORAL_TABLET | Freq: Four times a day (QID) | ORAL | 3 refills | Status: DC | PRN

## 2018-02-16 MED ORDER — METRONIDAZOLE 500 MG PO TABS: 500.0000 mg | ORAL_TABLET | Freq: Two times a day (BID) | ORAL | 0 refills | Status: DC

## 2018-02-16 NOTE — Patient Instructions (Signed)
Intrauterine Device Information An intrauterine device (IUD) is inserted into your uterus to prevent pregnancy. There are two types of IUDs available:  Copper IUD-This type of IUD is wrapped in copper wire and is placed inside the uterus. Copper makes the uterus and fallopian tubes produce a fluid that kills sperm. The copper IUD can stay in place for 10 years.  Hormone IUD-This type of IUD contains the hormone progestin (synthetic progesterone). The hormone thickens the cervical mucus and prevents sperm from entering the uterus. It also thins the uterine lining to prevent implantation of a fertilized egg. The hormone can weaken or kill the sperm that get into the uterus. One type of hormone IUD can stay in place for 5 years, and another type can stay in place for 3 years.  Your health care provider will make sure you are a good candidate for a contraceptive IUD. Discuss with your health care provider the possible side effects. Advantages of an intrauterine device  IUDs are highly effective, reversible, long acting, and low maintenance.  There are no estrogen-related side effects.  An IUD can be used when breastfeeding.  IUDs are not associated with weight gain.  The copper IUD works immediately after insertion.  The hormone IUD works right away if inserted within 7 days of your period starting. You will need to use a backup method of birth control for 7 days if the hormone IUD is inserted at any other time in your cycle.  The copper IUD does not interfere with your female hormones.  The hormone IUD can make heavy menstrual periods lighter and decrease cramping.  The hormone IUD can be used for 3 or 5 years.  The copper IUD can be used for 10 years. Disadvantages of an intrauterine device  The hormone IUD can be associated with irregular bleeding patterns.  The copper IUD can make your menstrual flow heavier and more painful.  You may experience cramping and vaginal bleeding after  insertion. This information is not intended to replace advice given to you by your health care provider. Make sure you discuss any questions you have with your health care provider. Document Released: 06/02/2004 Document Revised: 12/05/2015 Document Reviewed: 12/18/2012 Elsevier Interactive Patient Education  2017 [employer] Postpartum Depression and Baby Blues The postpartum period begins right after the birth of a baby. During this time, there is often a great amount of joy and excitement. It is also a time of many changes in the life of the parents. Regardless of how many times a mother gives birth, each child brings new challenges and dynamics to the family. It is not unusual to have feelings of excitement along with confusing shifts in moods, emotions, and thoughts. All mothers are at risk of developing postpartum depression or the "baby blues." These mood changes can occur right after giving birth, or they may occur many months after giving birth. The baby blues or postpartum depression can be mild or severe. Additionally, postpartum depression can go away rather quickly, or it can be a long-term condition. What are the causes? Raised hormone levels and the rapid drop in those levels are thought to be a main cause of postpartum depression and the baby blues. A number of hormones change during and after pregnancy. Estrogen and progesterone usually decrease right after the delivery of your baby. The levels of thyroid hormone and various cortisol steroids also rapidly drop. Other factors that play a role in these mood changes include major life events and genetics. What increases   the risk? If you have any of the following risks for the baby blues or postpartum depression, know what symptoms to watch out for during the postpartum period. Risk factors that may increase the likelihood of getting the baby blues or postpartum depression include:  Having a personal or family history of  depression.  Having depression while being pregnant.  Having premenstrual mood issues or mood issues related to oral contraceptives.  Having a lot of life stress.  Having marital conflict.  Lacking a social support network.  Having a baby with special needs.  Having health problems, such as diabetes.  What are the signs or symptoms? Symptoms of baby blues include:  Brief changes in mood, such as going from extreme happiness to sadness.  Decreased concentration.  Difficulty sleeping.  Crying spells, tearfulness.  Irritability.  Anxiety.  Symptoms of postpartum depression typically begin within the first month after giving birth. These symptoms include:  Difficulty sleeping or excessive sleepiness.  Marked weight loss.  Agitation.  Feelings of worthlessness.  Lack of interest in activity or food.  Postpartum psychosis is a very serious condition and can be dangerous. Fortunately, it is rare. Displaying any of the following symptoms is cause for immediate medical attention. Symptoms of postpartum psychosis include:  Hallucinations and delusions.  Bizarre or disorganized behavior.  Confusion or disorientation.  How is this diagnosed? A diagnosis is made by an evaluation of your symptoms. There are no medical or lab tests that lead to a diagnosis, but there are various questionnaires that a health care provider may use to identify those with the baby blues, postpartum depression, or psychosis. Often, a screening tool called the Edinburgh Postnatal Depression Scale is used to diagnose depression in the postpartum period. How is this treated? The baby blues usually goes away on its own in 1-2 weeks. Social support is often all that is needed. You will be encouraged to get adequate sleep and rest. Occasionally, you may be given medicines to help you sleep. Postpartum depression requires treatment because it can last several months or longer if it is not treated.  Treatment may include individual or group therapy, medicine, or both to address any social, physiological, and psychological factors that may play a role in the depression. Regular exercise, a healthy diet, rest, and social support may also be strongly recommended. Postpartum psychosis is more serious and needs treatment right away. Hospitalization is often needed. Follow these instructions at home:  Get as much rest as you can. Nap when the baby sleeps.  Exercise regularly. Some women find yoga and walking to be beneficial.  Eat a balanced and nourishing diet.  Do little things that you enjoy. Have a cup of tea, take a bubble bath, read your favorite magazine, or listen to your favorite music.  Avoid alcohol.  Ask for help with household chores, cooking, grocery shopping, or running errands as needed. Do not try to do everything.  Talk to people close to you about how you are feeling. Get support from your partner, family members, friends, or other new moms.  Try to stay positive in how you think. Think about the things you are grateful for.  Do not spend a lot of time alone.  Only take over-the-counter or prescription medicine as directed by your health care provider.  Keep all your postpartum appointments.  Let your health care provider know if you have any concerns. Contact a health care provider if: You are having a reaction to or problems with your medicine.   Get help right away if:  You have suicidal feelings.  You think you may harm the baby or someone else. This information is not intended to replace advice given to you by your health care provider. Make sure you discuss any questions you have with your health care provider. Document Released: 04/02/2004 Document Revised: 12/05/2015 Document Reviewed: 04/10/2013 Elsevier Interactive Patient Education  2017 Elsevier Inc.  

## 2018-02-16 NOTE — Progress Notes (Signed)
Subjective:     Colleen Fields is a 27 y.o. [id number] female who presents for a postpartum visit. She is 3 weeks postpartum following a spontaneous vaginal delivery. I have fully reviewed the prenatal and intrapartum course. The delivery was at 39.2 gestational weeks after IOL for A2GDM. Outcome: vaginal birth after cesarean (VBAC).  Anesthesia: epidural. Postpartum course has been unremarkable; just complains of some pain near laceration site and some difficulty urinating due to this.  Also has foul smelling vaginal discharge.  Baby's course has been unremarkable. Baby is feeding by breast. Bleeding thin lochia. Bowel function is normal. Bladder function is normal. Patient is not sexually active. Contraception method is none. Postpartum depression screening: positive, no SI and HI. She is willing to talk to our counselor  The following portions of the patient's history were reviewed and updated as appropriate: allergies, current medications, past family history, past medical history, past social history, past surgical history and problem list. Normal pap in 2016 as per patient.  Review of Systems Pertinent items noted in HPI and remainder of comprehensive ROS otherwise negative.   Objective:    BP 122/90   Pulse 81   Ht 4\' 11"  (1.499 m)   Wt 216 lb (98 kg)   Breastfeeding? Yes   BMI 43.63 kg/m   General:  alert and no distress   Breasts:  inspection negative, no nipple discharge or bleeding, no masses or nodularity palpable  Lungs: clear to auscultation bilaterally  Heart:  regular rate and rhythm  Abdomen: soft, non-tender; bowel sounds normal; no masses,  no organomegaly   Pelvic: Well-healing laceration site, no erythema. Sutures still visible. Normal urethral meatus. Foul-smelling yellow discharge seen.        Assessment and Plan:  1. Postpartum depression Patient [facility]. - Amb ref to [facility]  2. Vaginal discharge Likely BV - metroNIDAZOLE (FLAGYL) 500 MG tablet; Take 1 tablet (500 mg total) by mouth 2 (two) times daily.  Dispense: 14 tablet; Refill: 0  3. History of gestational diabetes mellitus Will return for postpartum 2 hr GTT  4. Encounter for contraceptive management, unspecified type Counseled about different modalities, desires POPs for now. Considering Paragard.  - norethindrone (MICRONOR,CAMILA,ERRIN) 0.35 MG tablet; Take 1 tablet (0.35 mg total) by mouth daily.  Dispense: 1 Package; Refill: 11  5. Postpartum care following vaginal delivery Pain medication refilled for her laceration pain.  - ibuprofen (ADVIL,MOTRIN) 600 MG tablet; Take 1 tablet (600 mg total) by mouth every 6 (six) hours as needed.  Dispense: 60 tablet; Refill: 3  Return in about 3 weeks (around 03/09/2018) for Needs AM appt for postpartum 2 hr GTT, pap smear, possible Paragard IUD placement, PP visit.   Jaynie CollinsUGONNA  Cliffard Hair, MD, FACOG Obstetrician & Gynecologist, [facility], [facility]

## 2018-03-01 ENCOUNTER — Other Ambulatory Visit: Payer: Self-pay

## 2018-03-01 DIAGNOSIS — O24414 Gestational diabetes mellitus in pregnancy, insulin controlled: Secondary | ICD-10-CM

## 2018-03-02 ENCOUNTER — Other Ambulatory Visit: Payer: Medicaid Other

## 2018-03-02 ENCOUNTER — Ambulatory Visit (INDEPENDENT_AMBULATORY_CARE_PROVIDER_SITE_OTHER): Payer: Self-pay | Admitting: Clinical

## 2018-03-02 DIAGNOSIS — F3340 Major depressive disorder, recurrent, in remission, unspecified: Secondary | ICD-10-CM

## 2018-03-02 DIAGNOSIS — O24414 Gestational diabetes mellitus in pregnancy, insulin controlled: Secondary | ICD-10-CM | POA: Diagnosis not present

## 2018-03-02 NOTE — BH Specialist Note (Signed)
Integrated Behavioral Health Follow Up Visit  MRN: [id number] Name: Colleen Fields  Number of Integrated Behavioral Health Clinician visits: 2/6 Session Start time: 8:48  Session End time: 9:15 Total time: 30 minutes  Type of Service: Integrated Behavioral Health- Individual/Family Interpretor:No. Interpretor Name and Language: n/a  SUBJECTIVE: Colleen Fields is a 27 y.o. female accompanied by n/a Patient was referred by Willodean Rosenthalarolyn Harraway-Smith, MD for depression and anxiety at last visit. Patient reports the following symptoms/concerns: Pt states her primary concern today is adjusting to life changes: new baby waking up every 2 hours, contemplating part time vs full time work, daughter going back to school, [place] from [place], and move to a new apartment in two weeks. Pt says the past month she has felt no depression, nor has she felt "snappy" and anxious as during pregnancy, and feels she is coping well at this time.  Duration of problem: Started in pregnancy; symptom decrease postpartum; Severity of problem: mild  OBJECTIVE: Mood: Normal and Affect: Appropriate Risk of harm to self or others: No plan to harm self or others  LIFE CONTEXT: Family and Social: Pt lives with her husband and 2 children (9yo daughter; newborn son) School/Work: Maternity leave Self-Care: Reading Bible on phone daily Life Changes: Recent childbirth; changes in daily habits and household changes  GOALS ADDRESSED: Patient will: 1.  Maintain reduction of symptoms of: anxiety, depression and stress  2.  Increase knowledge and/or ability of: stress reduction  3.  Demonstrate ability to: Increase healthy adjustment to current life circumstances  INTERVENTIONS: Interventions utilized:  Mindfulness or Relaxation Training Standardized Assessments completed: Not Needed  ASSESSMENT: Patient currently experiencing Major depressive disorder in remission  Patient may benefit from continued brief  therapeutic interventions regarding .  PLAN: 1. Follow up with behavioral health clinician on : As needed 2. Behavioral recommendations:  -Continue using self-coping strategies that have worked in the past -Consider adding Grounding strategy(5,4,3,2,1) for additional coping support when stress increases 3. Referral(s): Integrated Behavioral Health Services (In Clinic) 4. "From scale of 1-10, how likely are you to follow plan?": 9  Rae LipsJamie C Dayna Alia, LCSW    [facility] 01/20/2018 01/05/2018 11/19/2017 07/22/2017  Decreased Interest 3 3 3 1   Down, Depressed, Hopeless 2 2 3  0  PHQ - 2 Score 5 5 6 1   Altered sleeping 3 3 3 3   Tired, decreased energy 3 3 3 3   Change in appetite 3 3 3 2   Feeling bad or failure about yourself  2 3 1  0  Trouble concentrating 3 3 0 2  Moving slowly or fidgety/restless 2 0 3 0  Suicidal thoughts 0 0 0 0  PHQ-9 Score 21 20 19 11    GAD 7 : Generalized Anxiety Score 01/20/2018 01/05/2018 11/19/2017 07/22/2017  Nervous, Anxious, on Edge 3 3 3 2   Control/stop worrying 3 3 3 1   Worry too much - different things 3 3 3  0  Trouble relaxing 3 3 3  0  Restless 0 2 0 0  Easily annoyed or irritable 3 3 3 1   Afraid - awful might happen 3 3 3  0  Total GAD 7 Score 18 20 18  4

## 2018-03-03 LAB — GLUCOSE TOLERANCE, 2 HOURS
Glucose, 2 hour: 101 mg/dL (ref 65–139)
Glucose, GTT - Fasting: 85 mg/dL (ref 65–99)

## 2018-03-09 ENCOUNTER — Ambulatory Visit (INDEPENDENT_AMBULATORY_CARE_PROVIDER_SITE_OTHER): Payer: BLUE CROSS/BLUE SHIELD | Admitting: Certified Nurse Midwife

## 2018-03-09 ENCOUNTER — Other Ambulatory Visit (HOSPITAL_COMMUNITY)
Admission: RE | Admit: 2018-03-09 | Discharge: 2018-03-09 | Disposition: A | Discharge status: Home / Self Care | Payer: BLUE CROSS/BLUE SHIELD | Source: Ambulatory Visit | Attending: Certified Nurse Midwife | Admitting: Certified Nurse Midwife

## 2018-03-09 ENCOUNTER — Encounter: Payer: Self-pay | Admitting: Certified Nurse Midwife

## 2018-03-09 VITALS — BP 121/89 | HR 74 | Wt 217.0 lb

## 2018-03-09 DIAGNOSIS — N898 Other specified noninflammatory disorders of vagina: Secondary | ICD-10-CM | POA: Diagnosis not present

## 2018-03-09 NOTE — Progress Notes (Signed)
Subjective:     Colleen Fields is a 27 y.o. female who presents for a problem visit. She is 6 weeks postpartum following a spontaneous vaginal delivery. I have fully reviewed the prenatal and intrapartum course. The delivery was at 39.2 gestational weeks. Outcome: spontaneous vaginal delivery. Anesthesia: epidural. Postpartum course has been unremarkable. Baby's course has been unremarkable. Baby is feeding by breast. Bleeding no bleeding. Bowel function is normal. Bladder function is normal. Patient is not sexually active. Contraception method is oral progesterone-only contraceptive. Postpartum depression screening: negative. She complains of vaginal discharge and irritation at laceration. She reports discharge has been present for the past 3 weeks and she was treated for BV then which she completed course.   The following portions of the patient's history were reviewed and updated as appropriate: allergies, current medications, past medical history, past social history and problem list.  Review of Systems Pertinent items noted in HPI and remainder of comprehensive ROS otherwise negative.   Objective:    BP 121/89   Pulse 74   Wt 217 lb (98.4 kg)   BMI 43.83 kg/m   General:  alert, cooperative and no distress   Breasts:  negative  Lungs: clear to auscultation bilaterally  Heart:  regular rate and rhythm and S1, S2 normal  Abdomen: soft, non-tender; bowel sounds normal; no masses,  no organomegaly   Vulva:  normal  Vagina: normal vagina, no discharge, exudate, lesion, or erythema, laceration healed and no stitches present   Cervix:  anteverted  Corpus: normal size, contour, position, consistency, mobility, non-tender  Adnexa:  normal adnexa  Rectal Exam: Not performed.        Assessment/Plan:  1. Vaginal discharge - Patient reports continued vaginal discharge, no discharge seen on pelvic examination, vaginal swab obtained  - Sulcus laceration completely healed and no stitches present-  discussed with patient  - Cervicovaginal ancillary only  2. Postpartum care and examination - Normal postpartum exam and course  - Patient declines pap smear at this time - Continue birth control pills as prescribed    Will call patient with results of swab if positive and send in medication appropriately  Follow up in: 3 months for pap or as needed.    Colleen Fields Colleen Fields, CNM 03/09/18

## 2018-03-09 NOTE — Patient Instructions (Signed)
Postpartum Care After Vaginal Delivery °The period of time right after you deliver your newborn is called the postpartum period. °What kind of medical care will I receive? °· You may continue to receive fluids and medicines through an IV tube inserted into one of your veins. °· If an incision was made near your vagina (episiotomy) or if you had some vaginal tearing during delivery, cold compresses may be placed on your episiotomy or your tear. This helps to reduce pain and swelling. °· You may be given a squirt bottle to use when you go to the bathroom. You may use this until you are comfortable wiping as usual. To use the squirt bottle, follow these steps: °? Before you urinate, fill the squirt bottle with warm water. Do not use hot water. °? After you urinate, while you are sitting on the toilet, use the squirt bottle to rinse the area around your urethra and vaginal opening. This rinses away any urine and blood. °? You may do this instead of wiping. As you start healing, you may use the squirt bottle before wiping yourself. Make sure to wipe gently. °? Fill the squirt bottle with clean water every time you use the bathroom. °· You will be given sanitary pads to wear. °How can I expect to feel? °· You may not feel the need to urinate for several hours after delivery. °· You will have some soreness and pain in your abdomen and vagina. °· If you are breastfeeding, you may have uterine contractions every time you breastfeed for up to several weeks postpartum. Uterine contractions help your uterus return to its normal size. °· It is normal to have vaginal bleeding (lochia) after delivery. The amount and appearance of lochia is often similar to a menstrual period in the first week after delivery. It will gradually decrease over the next few weeks to a dry, yellow-brown discharge. For most women, lochia stops completely by 6-8 weeks after delivery. Vaginal bleeding can vary from woman to woman. °· Within the first few  days after delivery, you may have breast engorgement. This is when your breasts feel heavy, full, and uncomfortable. Your breasts may also throb and feel hard, tightly stretched, warm, and tender. After this occurs, you may have milk leaking from your breasts. Your health care provider can help you relieve discomfort due to breast engorgement. Breast engorgement should go away within a few days. °· You may feel more sad or worried than normal due to hormonal changes after delivery. These feelings should not last more than a few days. If these feelings do not go away after several days, speak with your health care provider. °How should I care for myself? °· Tell your health care provider if you have pain or discomfort. °· Drink enough water to keep your urine clear or pale yellow. °· Wash your hands thoroughly with soap and water for at least 20 seconds after changing your sanitary pads, after using the toilet, and before holding or feeding your baby. °· If you are not breastfeeding, avoid touching your breasts a lot. Doing this can make your breasts produce more milk. °· If you become weak or lightheaded, or you feel like you might faint, ask for help before: °? Getting out of bed. °? Showering. °· Change your sanitary pads frequently. Watch for any changes in your flow, such as a sudden increase in volume, a change in color, the passing of large blood clots. If you pass a blood clot from your vagina, save it   to show to your health care provider. Do not flush blood clots down the toilet without having your health care provider look at them. °· Make sure that all your vaccinations are up to date. This can help protect you and your baby from getting certain diseases. You may need to have immunizations done before you leave the hospital. °· If desired, talk with your health care provider about methods of family planning or birth control (contraception). °How can I start bonding with my baby? °Spending as much time as  possible with your baby is very important. During this time, you and your baby can get to know each other and develop a bond. Having your baby stay with you in your room (rooming in) can give you time to get to know your baby. Rooming in can also help you become comfortable caring for your baby. Breastfeeding can also help you bond with your baby. °How can I plan for returning home with my baby? °· Make sure that you have a car seat installed in your vehicle. °? Your car seat should be checked by a certified car seat installer to make sure that it is installed safely. °? Make sure that your baby fits into the car seat safely. °· Ask your health care provider any questions you have about caring for yourself or your baby. Make sure that you are able to contact your health care provider with any questions after leaving the hospital. °This information is not intended to replace advice given to you by your health care provider. Make sure you discuss any questions you have with your health care provider. °Document Released: 04/26/2007 Document Revised: 12/02/2015 Document Reviewed: 06/03/2015 °Elsevier Interactive Patient Education © 2018 [employer] ° °

## 2018-03-11 LAB — CERVICOVAGINAL ANCILLARY ONLY
Bacterial vaginitis: NEGATIVE
Candida vaginitis: NEGATIVE
Chlamydia: NEGATIVE
Neisseria Gonorrhea: NEGATIVE
Trichomonas: NEGATIVE

## 2018-03-16 ENCOUNTER — Telehealth: Payer: Self-pay

## 2018-03-16 NOTE — Telephone Encounter (Signed)
Pt called stating that she was moving stuff and lifted something heavy and started bleeding and having pain. LM for pt that we are returning her call to please give the office a call.

## 2018-03-17 NOTE — Telephone Encounter (Signed)
Colleen Fields called and left another message she is soaking a pad in about an hour and wants to know what to do- doesn't want to go to ER if she doesn't have to.  I called Tyliyah and left  A message I was returning her call and she can call us back tomorrow but if her symtoms are severe to go to [facility].

## 2018-03-30 ENCOUNTER — Ambulatory Visit (INDEPENDENT_AMBULATORY_CARE_PROVIDER_SITE_OTHER): Payer: BLUE CROSS/BLUE SHIELD | Admitting: Family Medicine

## 2018-03-30 ENCOUNTER — Encounter: Payer: Self-pay | Admitting: Family Medicine

## 2018-03-30 VITALS — BP 145/84 | HR 68 | Ht 59.0 in | Wt 215.0 lb

## 2018-03-30 DIAGNOSIS — R030 Elevated blood-pressure reading, without diagnosis of hypertension: Secondary | ICD-10-CM

## 2018-03-30 DIAGNOSIS — N939 Abnormal uterine and vaginal bleeding, unspecified: Secondary | ICD-10-CM | POA: Diagnosis not present

## 2018-03-30 NOTE — Patient Instructions (Signed)
Start progesterone birth control pills

## 2018-03-30 NOTE — Progress Notes (Signed)
   GYNECOLOGY OFFICE VISIT NOTE History:  27 y.o. G2P2002 here today for vaginal bleeding.   - last H/H 9.4/27.2  - pain with moving (9/3) immediate pressure, noticed bleeding ever since, saw a clot about size of golf ball, small clots now every time breastfeed full-time, always bright red, no smell, no cramping - no urinary leakage  - never started Micronor - stomach hurts like after had baby  - felt anxious with moving recently  - doesn't feel like incision, feels pain deeper inside   The following portions of the patient's history were reviewed and updated as appropriate: allergies, current medications, past family history, past medical history, past social history, past surgical history and problem list.   Review of Systems:  Pertinent items noted in HPI Review of Systems  Constitutional: Negative for chills and malaise/fatigue.  Respiratory: Negative for shortness of breath.   Cardiovascular: Negative for chest pain and leg swelling.  Gastrointestinal: Negative for constipation, diarrhea and nausea.  Genitourinary: Negative for frequency and urgency.  Neurological: Negative for headaches.  Psychiatric/Behavioral: The patient is nervous/anxious.    Objective:  Physical Exam BP (!) 145/84   Pulse 68   Ht 4\' 11"  (1.499 m)   Wt 215 lb (97.5 kg)   BMI 43.42 kg/m  Physical Exam  Constitutional: She is oriented to person, place, and time. She appears well-developed and well-nourished. No distress.  HENT:  Head: Normocephalic and atraumatic.  Eyes: Conjunctivae and EOM are normal. Left eye exhibits no discharge.  Cardiovascular: Normal rate.  Pulmonary/Chest: No respiratory distress.  Abdominal: There is tenderness (pelvic).  Genitourinary: Vagina normal.  Genitourinary Comments: Scant red blood in vault and coming from os, normal-appearing cervix  Neurological: She is alert and oriented to person, place, and time.  Psychiatric: She has a normal mood and affect. Her behavior  is normal.  Nursing note and vitals reviewed.  Labs and Imaging No results found for this or any previous visit (from the past 168 hour(s)). No results found.  Assessment & Plan:  15XW [age] G2P2002 who is about 272-months postpartum presenting for vaginal bleeding and pain after moving. Pain seems more consistent with possible pulled groin muscle or even cramping related to period.   Vaginal Bleeding: Most consistent with return of menstrual cycles. Increased duration and intensity of cramps likely related to recently postpartum. Has not started Micronor, not currently sexually active. Recommended starting Micronor to help with bleeding, instructed on consistent use.  -- encouraged follow-up if pain not improved after starting Micronor and using NSAIDs as could consider TVUS at that time  Elevated Blood Pressure  History of Gestational Hypertension - Asymptomatic. Could be elevated today due to stress related to vaginal bleeding. Recommended follow-up with PCP in 3 weeks.  BP Readings from Last 3 Encounters:  03/30/18 (!) 145/84  03/09/18 121/89  02/16/18 122/90   Postpartum Care: Provided letter per request with return to work date request for the beginning of October.   Cristal DeerLaurel S. Earlene PlaterWallace, DO OB Family Medicine Fellow, [facility], [facility]

## 2018-08-18 DIAGNOSIS — E1169 Type 2 diabetes mellitus with other specified complication: Secondary | ICD-10-CM | POA: Diagnosis not present

## 2018-08-18 DIAGNOSIS — E669 Obesity, unspecified: Secondary | ICD-10-CM | POA: Diagnosis not present

## 2018-08-18 DIAGNOSIS — E7800 Pure hypercholesterolemia, unspecified: Secondary | ICD-10-CM | POA: Diagnosis not present

## 2018-08-18 DIAGNOSIS — R12 Heartburn: Secondary | ICD-10-CM | POA: Diagnosis not present

## 2018-08-18 DIAGNOSIS — R5383 Other fatigue: Secondary | ICD-10-CM | POA: Diagnosis not present

## 2018-08-24 ENCOUNTER — Other Ambulatory Visit: Payer: Self-pay | Admitting: General Surgery

## 2018-08-24 ENCOUNTER — Other Ambulatory Visit (HOSPITAL_COMMUNITY): Payer: Self-pay | Admitting: General Surgery

## 2018-09-09 ENCOUNTER — Encounter: Payer: Self-pay | Admitting: Dietician

## 2018-09-09 ENCOUNTER — Encounter: Payer: BLUE CROSS/BLUE SHIELD | Attending: General Surgery | Admitting: Dietician

## 2018-09-09 VITALS — Ht 61.5 in | Wt 246.1 lb

## 2018-09-09 DIAGNOSIS — E669 Obesity, unspecified: Secondary | ICD-10-CM | POA: Diagnosis not present

## 2018-09-09 DIAGNOSIS — F509 Eating disorder, unspecified: Secondary | ICD-10-CM | POA: Diagnosis not present

## 2018-09-09 NOTE — Progress Notes (Signed)
Bariatric Pre-Op Nutrition Assessment Medical Nutrition Therapy  Appt Start Time: 11:30am  End time: 12:30pm  Patient was seen on 09/09/2018 for Pre-Operative Nutrition Assessment. Assessment and letter of approval faxed to [facility] Bariatric Surgery Program coordinator on 09/09/2018.   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 Pt states work is very stressful and emotionally hurtful at times. Pt has a 44 month old son and an 42 year old daughter. Pt's husband is a IT trainer.   24-Hr Dietary Recall First Meal: usually skips  Snack: usually skips  Second Meal: tacos  Snack: chocolate  Third Meal: shrimp + potatoes + veggies  Snack: hershey's chocolate kisses  Beverages: water   Food & Nutrition Related Hx Dietary Hx: Pt states she is used to not eating meals during the day until dinner, which is a very large meal. Pt states she is a stress eater, which plays into her large dinner meal when she is unwinding after work. Pt states work is stressful and she does not have time for physical activity. Pt states she snacks during the day on chocolate and will eat more chocolate after dinner.   Estimated Daily Fluid Intake: 64 oz Supplements: magnesium, prenatal, fish oil  GI / Other Notable Symptoms: heartburn  Physical Activity  Current average weekly physical activity: ADLs, walking 15 mins/day  Estimated Energy Needs Calories: 1600 Carbohydrate: 180g Protein: 120g Fat: 44g  Pre-Op Goals  Reviewed with the Patient . Track food and beverage intake (try MyFitness Pal or the Baritastic app) . Make healthy food choices while monitoring portion sizes . Avoid concentrated sugars and fried foods . Keep fat & sugar in the single digits per serving on food labels . Practice CHEWING your food (aim for applesauce consistency) . Practice not drinking 15 minutes before, during, and 30 minutes after each meal and snack . Avoid all carbonated beverages (ex: soda, sparkling beverages)  . Limit caffeinated beverages (ex: coffee, tea, energy drinks) . Avoid all sugar-sweetened beverages (ex: regular soda, sports drinks)  . Avoid alcohol  . Consume 3 meals per day or try to eat every 3-5 hours . Make a list of non-food related activities . Aim for 64-100 ounces of FLUID daily (with at least half of fluid intake being plain water)  . Aim for at least 60-80 grams of PROTEIN daily . Look for a liquid protein source that contains ?15 g protein and ?5 g carbohydrate (ex: shakes, drinks, shots) . Physical activity is an important part of a healthy lifestyle so keep it moving! The goal is to reach 150 minutes of exercise per week, including cardiovascular and weight baring activity.  Handouts Provided Include  . Bariatric Surgery handouts (Nutrition Visits, Pre-Op Goals, Protein Shakes, Vitamins & Minerals, Support Group 2020 Schedule)  Learning Style & Readiness for Change Teaching method utilized: Visual & Auditory  Demonstrated degree of understanding via: Teach Back  Barriers to learning/adherence to lifestyle change: contemplative stage of change, depression, fear of lifestyle changes after surgery   Next Steps Supervised Weight Loss (SWL) Visits Needed: 0  Patient is to call  [facility] to enroll in Pre-Op Class (>2 weeks before surgery) and Post-Op Class (2 weeks after surgery) for further nutrition education when surgery date is scheduled.

## 2018-09-09 NOTE — Patient Instructions (Addendum)
Begin working through the [employer] discussed today. Choose 1 or 2 goals to start with this week, then add in another goal or 2 each week.   Start taking a multivitamin and vitamin D if you aren't already.   See you at Pre-Op Class!

## 2018-09-30 ENCOUNTER — Ambulatory Visit (HOSPITAL_COMMUNITY): Payer: BLUE CROSS/BLUE SHIELD

## 2018-10-26 ENCOUNTER — Encounter (HOSPITAL_COMMUNITY): Payer: Self-pay

## 2018-10-26 ENCOUNTER — Ambulatory Visit (HOSPITAL_COMMUNITY): Payer: BLUE CROSS/BLUE SHIELD

## 2019-01-02 ENCOUNTER — Ambulatory Visit (HOSPITAL_COMMUNITY)
Admission: RE | Admit: 2019-01-02 | Discharge: 2019-01-02 | Disposition: A | Discharge status: Home / Self Care | Payer: BC Managed Care – PPO | Source: Ambulatory Visit | Attending: General Surgery | Admitting: General Surgery

## 2019-01-02 ENCOUNTER — Other Ambulatory Visit: Payer: Self-pay

## 2019-01-02 DIAGNOSIS — Z9884 Bariatric surgery status: Secondary | ICD-10-CM | POA: Diagnosis not present

## 2019-01-02 DIAGNOSIS — M4124 Other idiopathic scoliosis, thoracic region: Secondary | ICD-10-CM | POA: Diagnosis not present

## 2019-02-01 DIAGNOSIS — F509 Eating disorder, unspecified: Secondary | ICD-10-CM | POA: Diagnosis not present

## 2019-06-18 IMAGING — US US FETAL BPP W/ NON-STRESS
1 series · 13 of 16 positions shown · non-contrast
Comparison: none

[Series 1: us fetal bpp w/nonstress · 16 acquisitions, 13 frames shown]
[im 1/16]
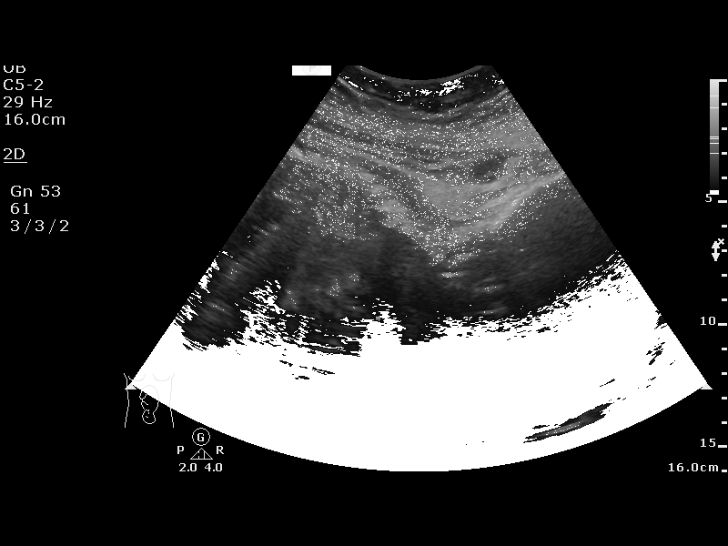
[im 2/16]
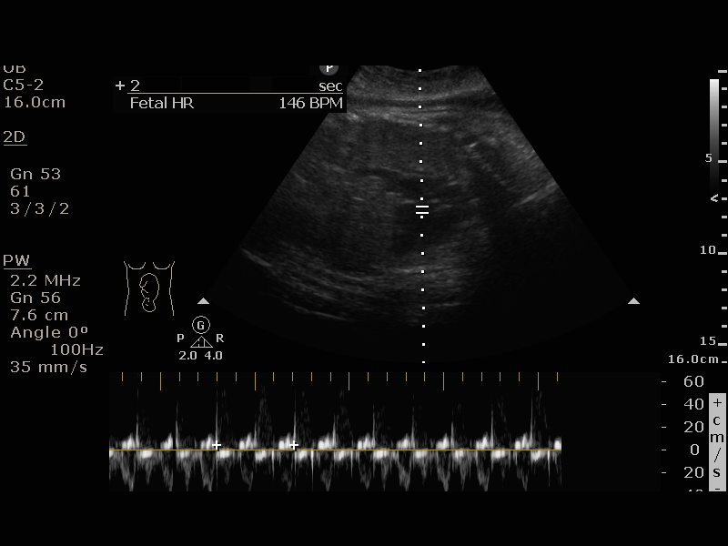
[im 4/16]
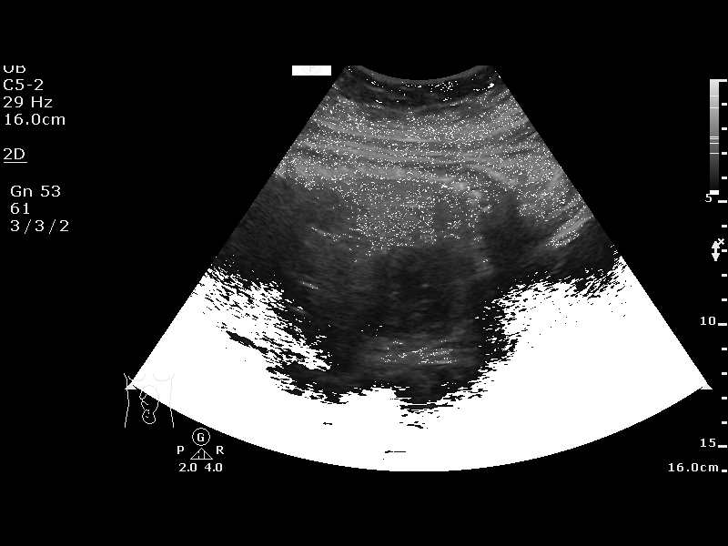
[im 5/16]
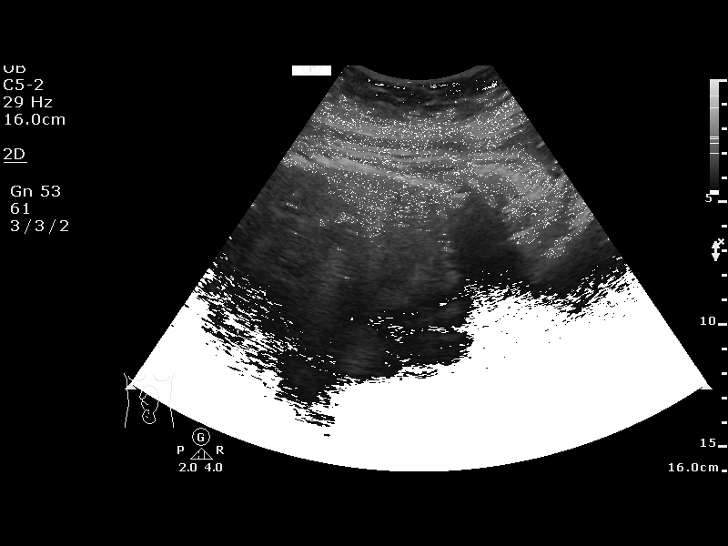
[im 6/16]
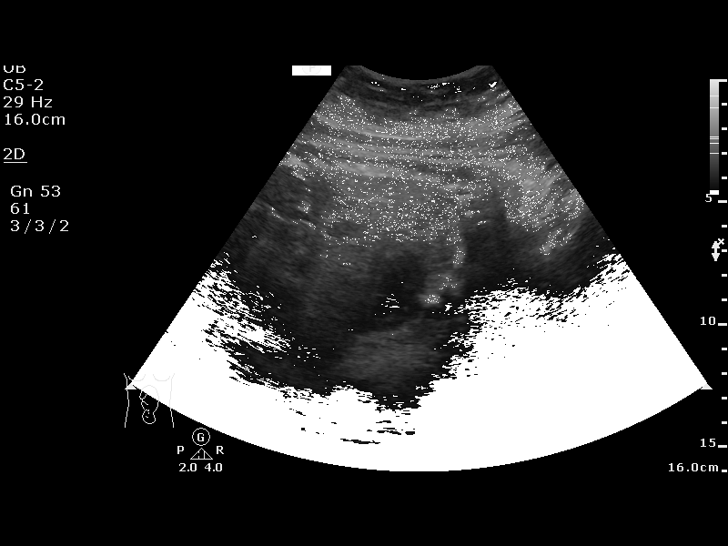
[im 7/16]
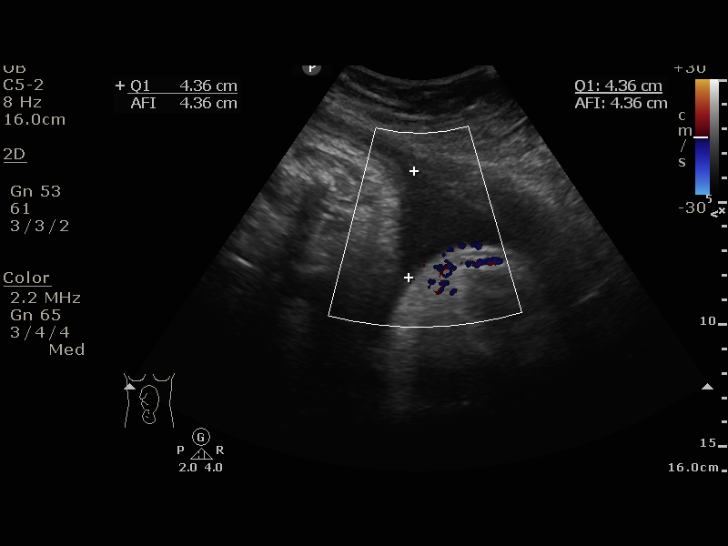
[im 9/16]
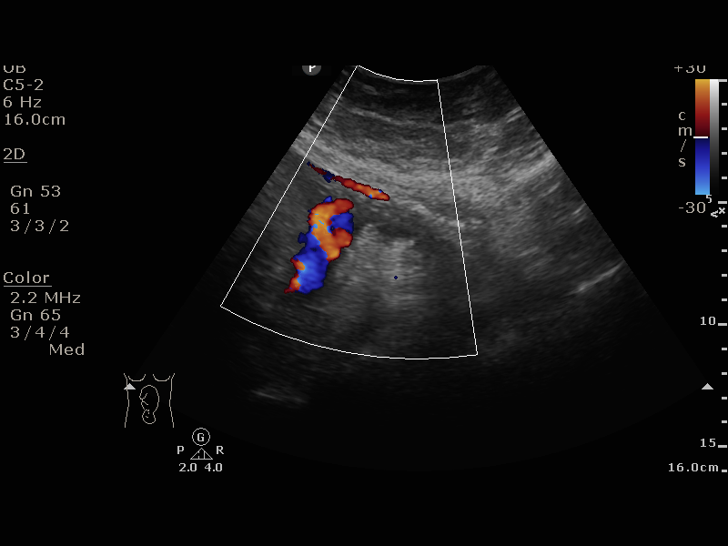
[im 10/16]
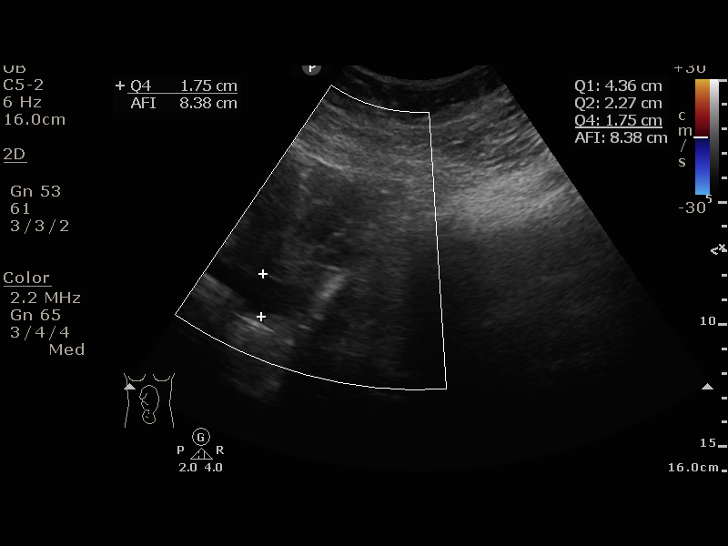
[im 11/16]
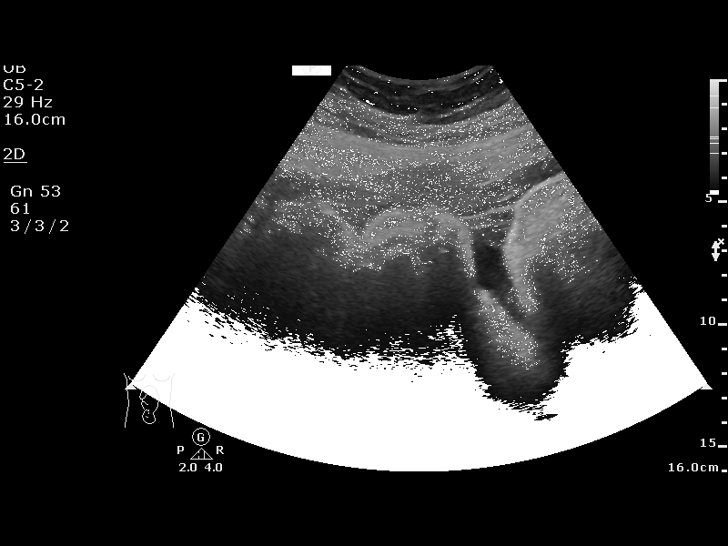
[im 12/16]
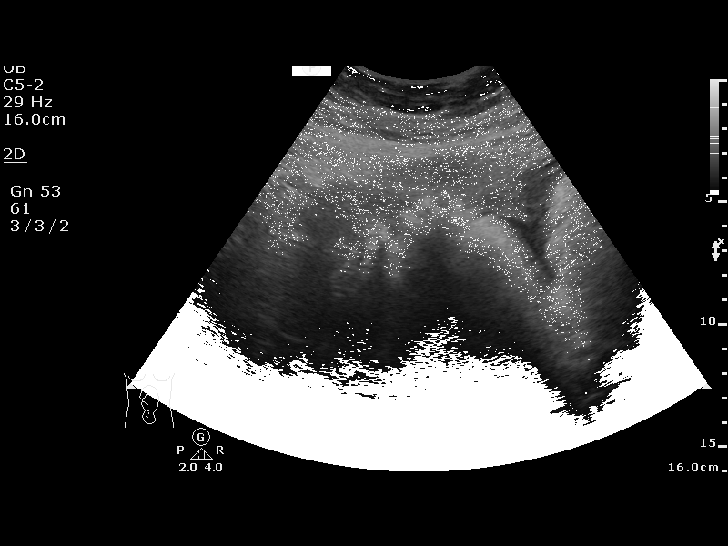
[im 13/16]
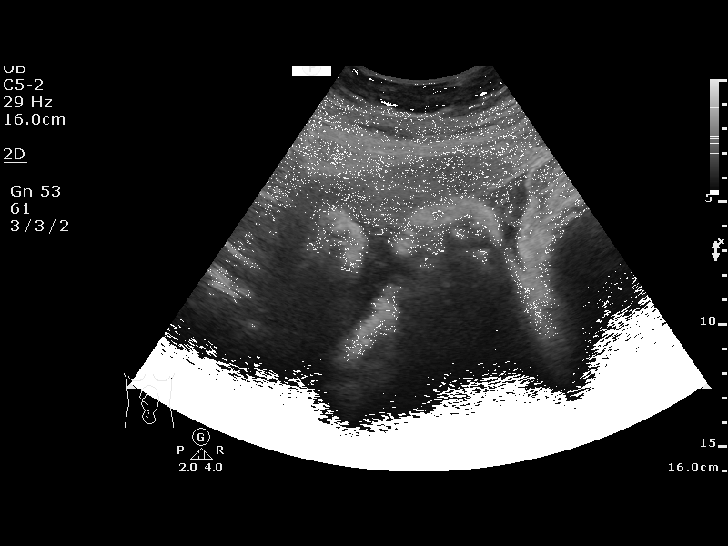
[im 15/16]
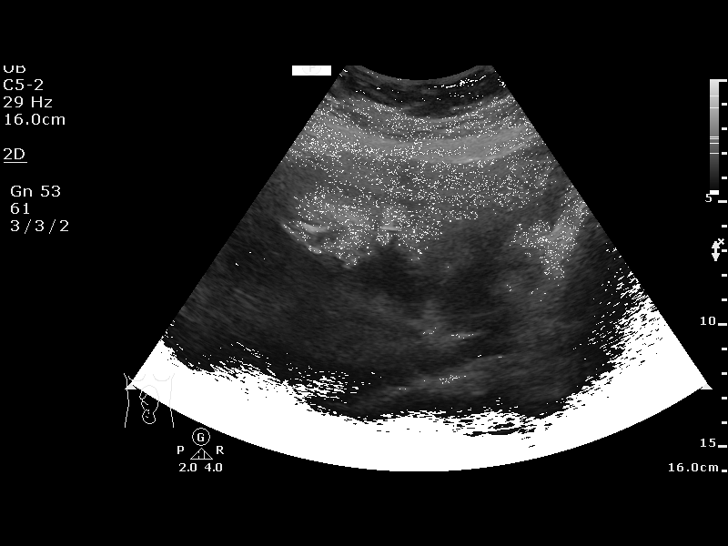
[im 16/16]
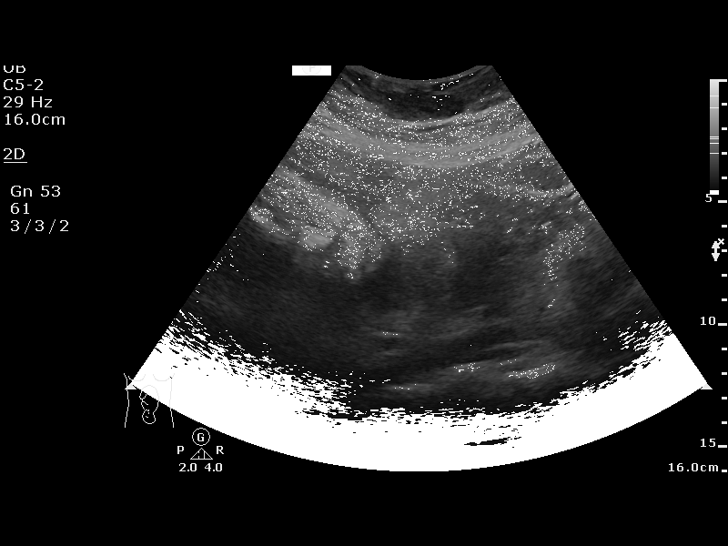

[13 of 16 positions shown; findings below may reference images not displayed]

Attending:        Janjanin Murselji         Location:         Center for
[REDACTED]

1  US FETAL BPP W/NONSTRESS                    76818.4

1  CHELSIE JIM           [id number]      [id number]     [id number]
Service(s) Provided

Indications

38 weeks gestation of pregnancy
Gestational diabetes in pregnancy, insulin
controlled
OB History

Blood Type:            Height:  4'11"  Weight (lb):  242       BMI:
Gravidity:    2         Prem:   1
Living:       1
Fetal Evaluation

Num Of Fetuses:     1
Preg. Location:     Intrauterine
Fetal Heart         146
Rate(bpm):
Cardiac Activity:   Observed
Presentation:       Cephalic

Amniotic Fluid
AFI FV:      Subjectively low-normal

AFI Sum(cm)     %Tile       Largest Pocket(cm)
8.38            14

RUQ(cm)       RLQ(cm)       LUQ(cm)        LLQ(cm)
4.36          1.75          2.27           0
Biophysical Evaluation

Amniotic F.V:   Pocket => 2 cm two         F. Tone:        Observed
planes
F. Movement:    Observed                   Score:          [DATE]
F. Breathing:   Observed
Gestational Age

LMP:           38w 6d        Date:  04/23/17                 EDD:   01/28/18
Best:          38w 6d     Det. By:  LMP  (04/23/17)          EDD:   01/28/18
Comments

Technically limited exam due to maternal body habitus.
Impression

IUP at 15w2d
Low normal amniotic fluid volume
Reassuring BPP of [DATE]; had reactive NST last night in [place]
Recommendations

Scheduled for induction of labor tomorrow (01/21/18) at 39
weeks

## 2019-06-26 IMAGING — CT CT ANGIO CHEST
1 of 3 series · 18 of 33 positions shown · IV contrast (OMNIPAQUE)
Comparison: Chest radiograph-earlier same day

CLINICAL DATA: Postpartum shortness of breath and chest pain.
Evaluate for pulmonary embolism.

EXAM:
CT ANGIOGRAPHY CHEST WITH CONTRAST
TECHNIQUE: Multidetector CT imaging of the chest was performed using the
standard protocol during bolus administration of intravenous
contrast. Multiplanar CT image reconstructions and MIPs were
obtained to evaluate the vascular anatomy.
CONTRAST:  100mL TLB012-32H IOPAMIDOL (TLB012-32H) INJECTION 76%

[Series 10: (person_name) thins · axial · 0.63mm/px · z∈[+865,+1116]mm · 18 of 411 slices shown]
[im 26/411  lung]
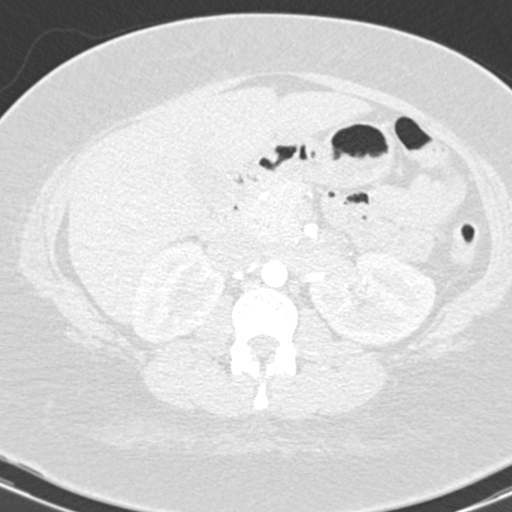
[im 52/411  mediastinal]
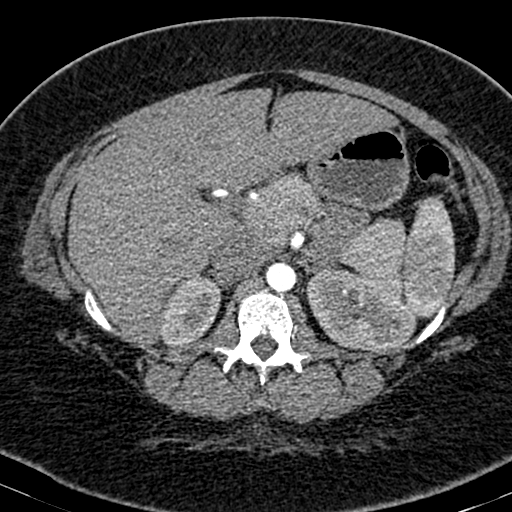
[im 77/411  lung]
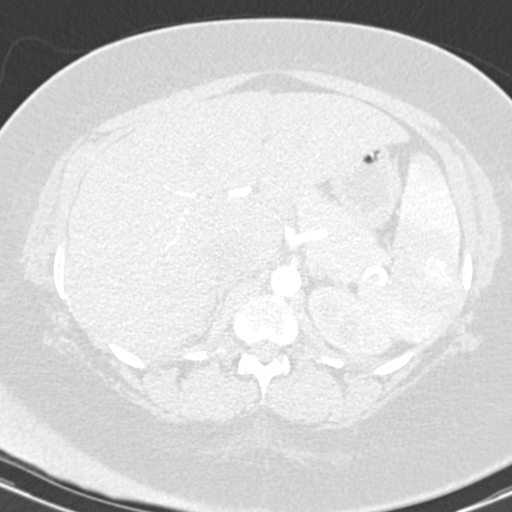
[im 103/411  mediastinal]
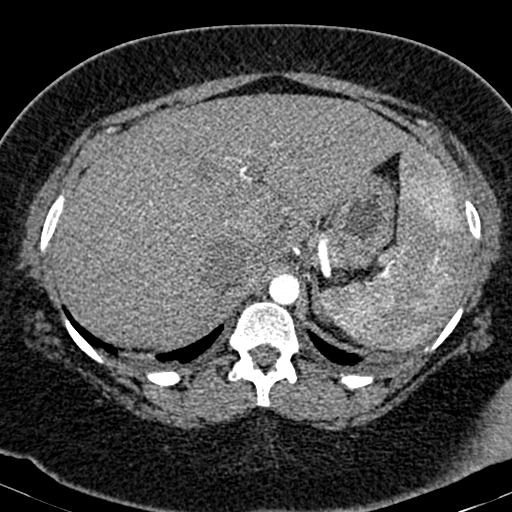
[im 129/411  lung]
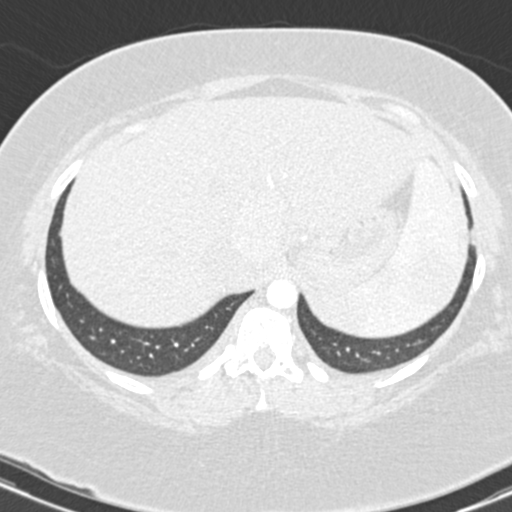
[im 137/411  mediastinal]
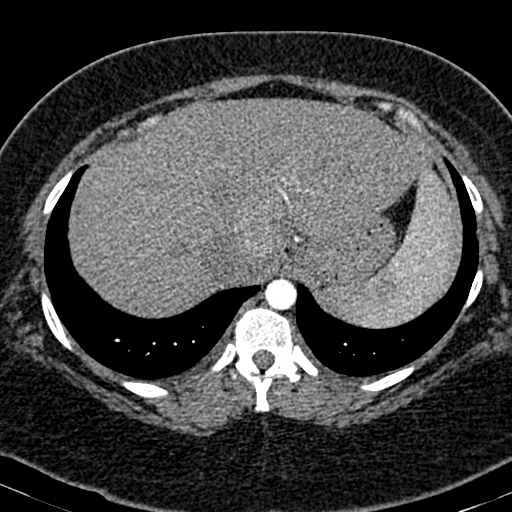
[im 154/411  lung]
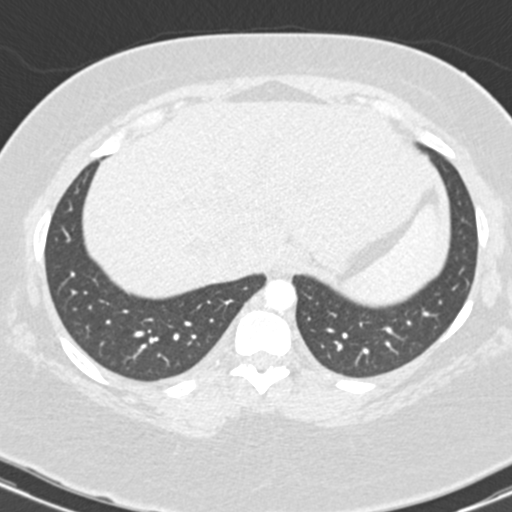
[im 180/411  mediastinal]
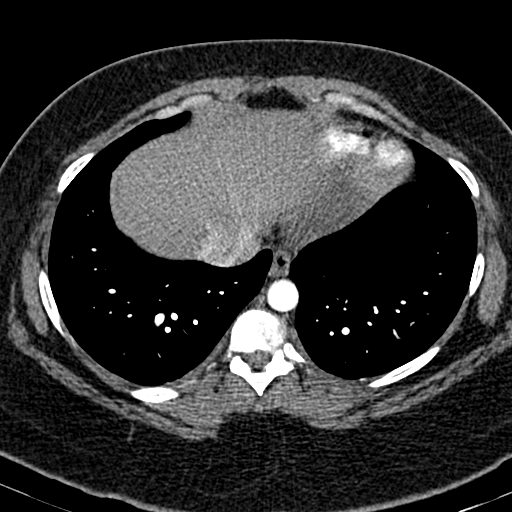
[im 192/411  lung]
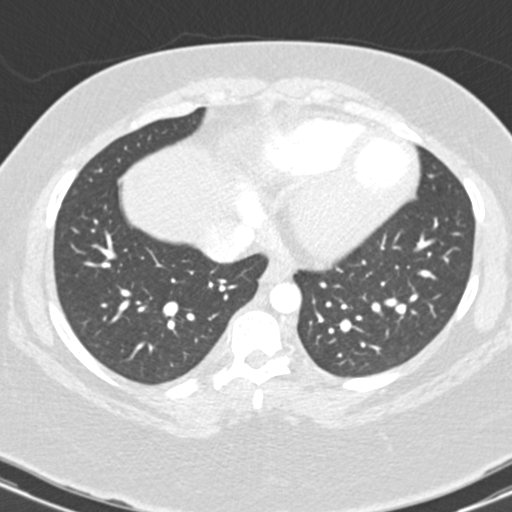
[im 206/411  mediastinal]
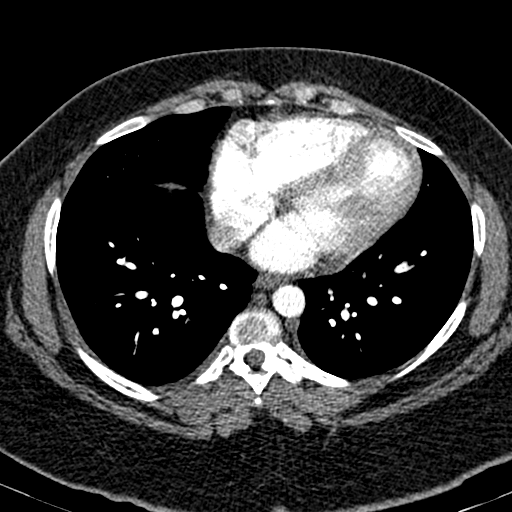
[im 231/411  lung]
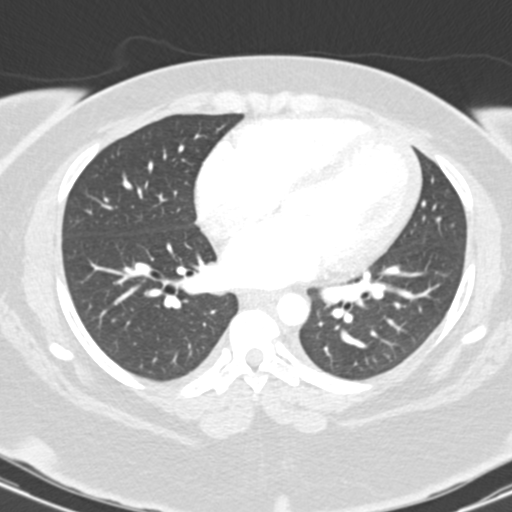
[im 257/411  mediastinal]
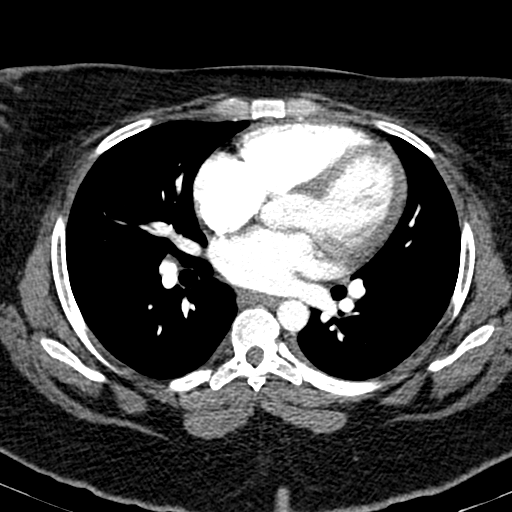
[im 274/411  lung]
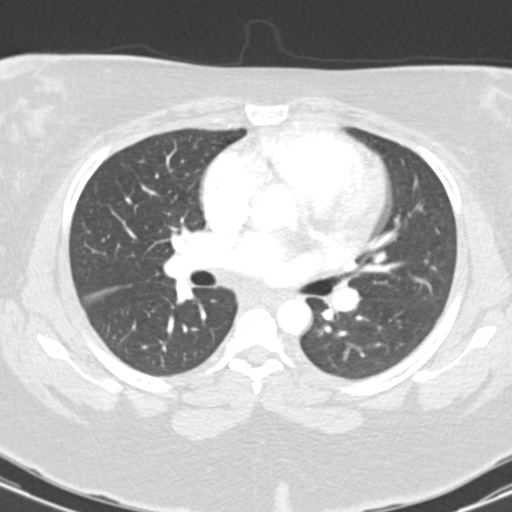
[im 282/411  mediastinal]
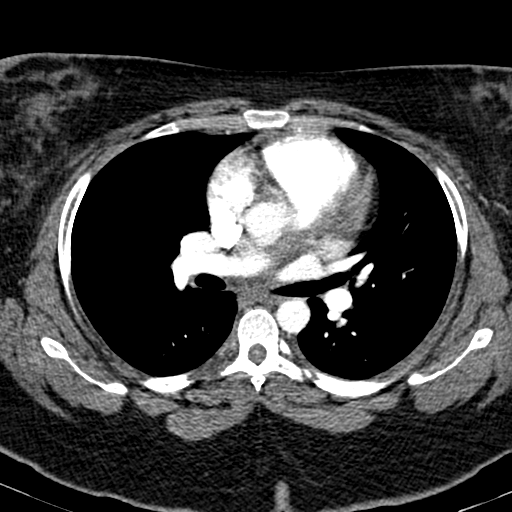
[im 308/411  lung]
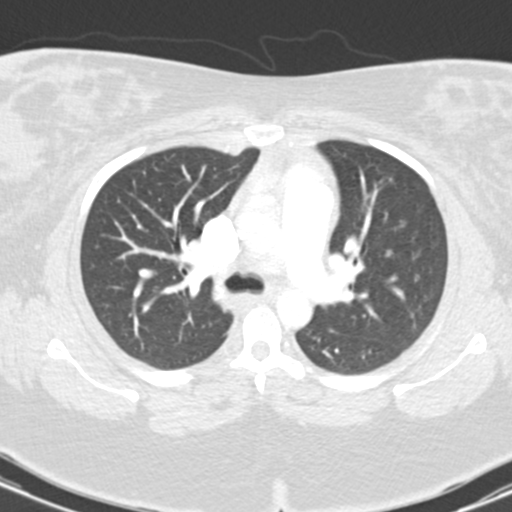
[im 334/411  mediastinal]
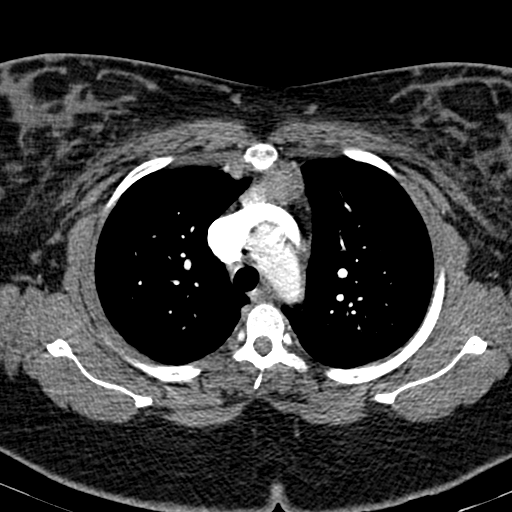
[im 359/411  lung]
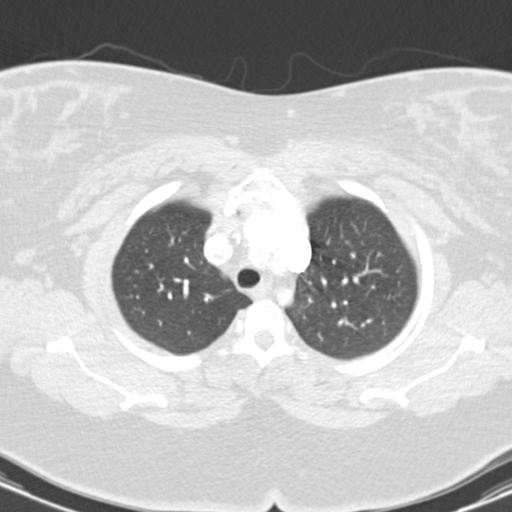
[im 385/411  mediastinal]
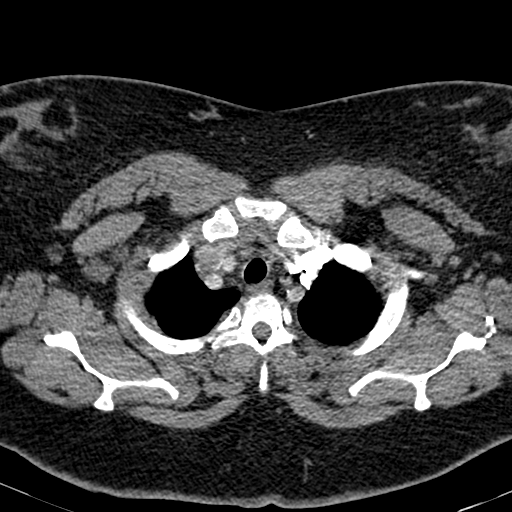

[18 of 33 positions shown; findings below may reference images not displayed]

FINDINGS: Vascular Findings:

There is adequate opacification of the pulmonary arterial system
with the main pulmonary artery measuring 280 Hounsfield units. There
are no discrete filling defects within the pulmonary arterial tree
to suggest pulmonary embolism. Normal caliber of the main pulmonary
artery.

Normal heart size. No evidence of thoracic aortic aneurysm or
dissection on this nongated examination. Bovine configuration of the
aortic arch. The branch vessels of the aortic arch appear widely
patent throughout their imaged course.

Review of the MIP images confirms the above findings.

----------------------------------------------------------------------------------

Nonvascular Findings:

Mediastinum/Lymph Nodes: Ill-defined soft tissue within the anterior
mediastinum is favored to represent residual thymic tissue. No bulky
mediastinal, hilar or axillary lymphadenopathy.

Lungs/Pleura: Minimal dependent subpleural ground-glass atelectasis.
No focal airspace opacities. No pleural effusion or pneumothorax.
The central pulmonary airways appear widely patent.

No discrete pulmonary nodules.

Upper abdomen: Limited early arterial phase evaluation of the upper
abdomen is unremarkable.

Musculoskeletal: No acute or aggressive osseous abnormalities.
Regional soft tissues appear normal. Normal appearance of the
thyroid gland.
IMPRESSION: No acute cardiopulmonary disease. Specifically, no evidence of
pulmonary embolism.

## 2019-11-16 ENCOUNTER — Encounter: Payer: No Typology Code available for payment source | Attending: General Surgery | Admitting: Dietician

## 2019-11-16 ENCOUNTER — Other Ambulatory Visit: Payer: Self-pay

## 2019-11-16 ENCOUNTER — Encounter: Payer: Self-pay | Admitting: Dietician

## 2019-11-16 DIAGNOSIS — E669 Obesity, unspecified: Secondary | ICD-10-CM | POA: Insufficient documentation

## 2019-11-16 NOTE — Patient Instructions (Addendum)
Aim to eat at least 1 more time during the day (before you have dinner) either a small meal or a snack.

## 2019-11-16 NOTE — Progress Notes (Signed)
Supervised Weight Loss Visit Bariatric Nutrition Education  Planned Surgery: Sleeve   Pt Expectation of Surgery/ Goals: to lose weight and keep it off   1st SWL Appointment   NUTRITION ASSESSMENT  Anthropometrics  Start weight at [facility]: 246.1 lbs (date: 09/09/2018) Today's weight: 244 lbs BMI: 45.7 kg/m2    Lifestyle & Dietary Hx Pt is currently in school and states this makes her stressed while trying to wrap up the semester. States she has cravings, especially in the evenings. Typical meal pattern is 1 meal per day plus chocolate after dinner. Will have fish, rice, and vegetables for a meal. May have chicken for protein and potatoes for starch. States she used to eat a full bag of Hershey[employer] chocolates nearly every evening after dinner, but has cut back recently. States that she went a few weeks without it, dropped ~5 lbs in 4 weeks. States that now she cannot stomach even half of a bag of the chocolates.    24-Hr Dietary Recall First Meal: - Snack: - Second Meal: - Snack: - Third Meal: meat + starch + vegetable Snack: chocolate Beverages: water, OJ   Estimated Energy Needs Calories: 1600-1800 Carbohydrate: 180-200g Protein: 100-113g Fat: 53-60g   NUTRITION DIAGNOSIS  Overweight/obesity ([place]-3.3) related to past poor dietary habits and physical inactivity as evidenced by patient w/ planned Sleeve Gastrectomy surgery following dietary guidelines for continued weight loss.   NUTRITION INTERVENTION  Nutrition counseling (C-1) and education (E-2) to facilitate bariatric surgery goals.  Pre-Op Goals Progress & New Goals . Add 1 more meal/snack during the day  Handouts Provided Include   Meal Ideas & MyPlate Portions   Learning Style & Readiness for Change Teaching method utilized: Visual & Auditory  Demonstrated degree of understanding via: Teach Back  Barriers to learning/adherence to lifestyle change: cravings at night    MONITORING & EVALUATION Dietary intake,  weekly physical activity, body weight, and pre-op goals at next nutrition visit.   Next Steps  Patient is to return to [facility] for 2nd SWL Visit.

## 2019-11-23 ENCOUNTER — Other Ambulatory Visit: Payer: Self-pay

## 2019-11-23 ENCOUNTER — Encounter: Payer: No Typology Code available for payment source | Admitting: Skilled Nursing Facility1

## 2019-11-23 DIAGNOSIS — E669 Obesity, unspecified: Secondary | ICD-10-CM

## 2019-11-23 NOTE — Progress Notes (Signed)
Supervised Weight Loss Visit Bariatric Nutrition Education  Planned Surgery: Sleeve   Pt Expectation of Surgery/ Goals: to lose weight and keep it off   3rd SWL Appointment  Pt states she only needs 7 visits total with no amount of space in between   NUTRITION ASSESSMENT  Anthropometrics  Start weight at [facility]: 246.1 lbs (date: 09/09/2018) Today's weight: 244 lbs BMI: 45.52 kg/m2    Lifestyle & Dietary Hx  Pt states she has been prescribed phentermine but has not started it. Pt states she des not have the opportunity buy the chocolate due to school and work and kids which has stoppedher from eating it.  Reviewed the importance of understanding relationship with food.   Pt stepped out of appt to talk on the phone. Pt states she wants to do plant based diet.    24-Hr Dietary Recall First Meal: - Snack: - Second Meal: 11am: boiled egg and bacon with cheese and tomato  Snack: - Third Meal: meat + starch + vegetable Snack: chocolate Beverages: water, orange juice  Estimated Energy Needs Calories: 1600-1800 Carbohydrate: 180-200g Protein: 100-113g Fat: 53-60g   NUTRITION DIAGNOSIS  Overweight/obesity ([place]-3.3) related to past poor dietary habits and physical inactivity as evidenced by patient w/ planned Sleeve Gastrectomy surgery following dietary guidelines for continued weight loss.   NUTRITION INTERVENTION  Nutrition counseling (C-1) and education (E-2) to facilitate bariatric surgery goals.  Pre-Op Goals Progress & New Goals . Continue: Add 1 more meal/snack during the day . NEW: Allow for the room (in context of relationship)   Handouts Provided Include   Meal Ideas & MyPlate Portions   Learning Style & Readiness for Change Teaching method utilized: Visual & Auditory  Demonstrated degree of understanding via: Teach Back  Barriers to learning/adherence to lifestyle change: cravings at night    MONITORING & EVALUATION Dietary intake, weekly physical activity,  body weight, and pre-op goals at next nutrition visit.   Next Steps  Patient is to return to [facility] for 4th [facility] Visit.

## 2019-11-28 ENCOUNTER — Ambulatory Visit: Payer: BC Managed Care – PPO | Admitting: Skilled Nursing Facility1

## 2019-11-30 ENCOUNTER — Other Ambulatory Visit: Payer: Self-pay

## 2019-11-30 ENCOUNTER — Encounter: Payer: Self-pay | Admitting: Dietician

## 2019-11-30 ENCOUNTER — Encounter: Payer: No Typology Code available for payment source | Admitting: Dietician

## 2019-11-30 DIAGNOSIS — E669 Obesity, unspecified: Secondary | ICD-10-CM

## 2019-11-30 NOTE — Patient Instructions (Signed)
Continue to work on eating at least 3 times per day, ideally 3 meals or smaller meals/snacks. Good job of incorporating breakfast!  Continue to work on your relationship with food to help identify and work through stress related eating.   Work on chewing each bite of food to applesauce consistency.

## 2019-11-30 NOTE — Progress Notes (Signed)
Supervised Weight Loss Visit Bariatric Nutrition Education  Planned Surgery: Sleeve   Pt Expectation of Surgery/ Goals: to lose weight and keep it off   3rd SWL Appointment     NUTRITION ASSESSMENT  Anthropometrics  Start weight at [facility]: 246.1 lbs (date: 09/09/2018) Today's weight: 247.2 lbs BMI: 45.9 kg/m2    Lifestyle & Dietary Hx Pt states she has been prescribed phentermine and has started taking it every other day. Pt states she feels more relaxed since taking phentermine as well as decreased appetite. Pt states she has started eating breakfast in the mornings which she was not doing before. Typical meal pattern is breakfast, midday snack, dinner (7-8pm), then sometimes dessert/sweets in the evening. States she will have chocolate or ice cream or wine at night if she has had a stressful day, and has identified that these sweets in the evening are stress-related eating.   24-Hr Dietary Recall First Meal: 2 eggs + toast + avocado  Snack: - Second Meal: -   Snack: hummus + tomatoes  Third Meal 8pm: seafood + potatoes + plantains  Snack: wine or chocolate or ice cream Beverages: water, orange juice   Estimated Energy Needs Calories: 1600-1800 Carbohydrate: 180-200g Protein: 100-113g Fat: 53-60g   NUTRITION DIAGNOSIS  Overweight/obesity ([place]-3.3) related to past poor dietary habits and physical inactivity as evidenced by patient w/ planned Sleeve Gastrectomy surgery following dietary guidelines for continued weight loss.   NUTRITION INTERVENTION  Nutrition counseling (C-1) and education (E-2) to facilitate bariatric surgery goals.  Pre-Op Goals Progress & New Goals . Continue working on adding 1 more meal/snack during the day . Continue working on stress eating . NEW: Chew each bite thoroughly to applesauce consistency before swallowing   Learning Style & Readiness for Change Teaching method utilized: Visual & Auditory  Demonstrated degree of understanding via: Teach  Back  Barriers to learning/adherence to lifestyle change: Stress Eating at Night    MONITORING & EVALUATION Dietary intake, weekly physical activity, body weight, and pre-op goals at next nutrition visit.   Next Steps  Patient is to return to [facility] for 4th [facility] Visit.

## 2019-12-07 ENCOUNTER — Other Ambulatory Visit: Payer: Self-pay

## 2019-12-07 ENCOUNTER — Encounter: Payer: No Typology Code available for payment source | Admitting: Skilled Nursing Facility1

## 2019-12-07 DIAGNOSIS — E669 Obesity, unspecified: Secondary | ICD-10-CM | POA: Diagnosis not present

## 2019-12-07 NOTE — Progress Notes (Signed)
Supervised Weight Loss Visit Bariatric Nutrition Education  Planned Surgery: Sleeve   Pt Expectation of Surgery/ Goals: to lose weight and keep it off   5th SWL Appointment of 7   NUTRITION ASSESSMENT  Anthropometrics  Start weight at [facility]: 246.1 lbs (date: 09/09/2018) Today's weight: 249 lbs BMI: 46.29 kg/m2    Lifestyle & Dietary Hx  Pt arrives having gained about 2 pounds. Pt states she did not want to be on antidepressants so she takes magnesium which she feels helps her mental health. Pt states she has been taking phentermine but every 3 days due to not needing it daily. Pt states she thinks she has gout: dietitian advsied she speak with her doctor bout this which himalyan salt helps with. Pt states she has been very stressed due to her job and her aunt was diagnosed with breast cancer now needing hospice.  Pt has realized she uses food as a vice to emotionally deal with life occurences: pt is making some really good strides towards controlling this behavior.   24-Hr Dietary Recall First Meal: 2 eggs + bacon + onion + pepper Snack: - Second Meal: -   Snack: hummus + tomatoes  Third Meal 8pm: seafood + potatoes + plantains  Snack: wine or chocolate or ice cream Beverages: water, orange juice   Estimated Energy Needs Calories: 1600-1800 Carbohydrate: 180-200g Protein: 100-113g Fat: 53-60g   NUTRITION DIAGNOSIS  Overweight/obesity ([place]-3.3) related to past poor dietary habits and physical inactivity as evidenced by patient w/ planned Sleeve Gastrectomy surgery following dietary guidelines for continued weight loss.   NUTRITION INTERVENTION  Nutrition counseling (C-1) and education (E-2) to facilitate bariatric surgery goals.  Pre-Op Goals Progress & New Goals . Continue working on adding 1 more meal/snack during the day . Continue working on stress eating . Chew each bite thoroughly to applesauce consistency before swallowing  . NEW: identify different stresses leading  you to use food to cope . NEW: find new things to do to deal with stress rather than eat such as walking the stars, calling your sister, going for a walk, etc. . NEW: work on re framing that negative talk to yourself and turn it into a positive: You can do this! It will be difficult but not impossible!  Learning Style & Readiness for Change Teaching method utilized: Visual & Auditory  Demonstrated degree of understanding via: Teach Back  Barriers to learning/adherence to lifestyle change: emotional eating   MONITORING & EVALUATION Dietary intake, weekly physical activity, body weight, and pre-op goals at next nutrition visit.   Next Steps  Patient is to return to [facility] for 6th [facility] Visit.

## 2019-12-14 ENCOUNTER — Encounter: Payer: No Typology Code available for payment source | Attending: General Surgery | Admitting: Skilled Nursing Facility1

## 2019-12-14 ENCOUNTER — Other Ambulatory Visit: Payer: Self-pay

## 2019-12-14 DIAGNOSIS — E669 Obesity, unspecified: Secondary | ICD-10-CM | POA: Diagnosis present

## 2019-12-14 NOTE — Progress Notes (Signed)
Supervised Weight Loss Visit Bariatric Nutrition Education  Planned Surgery: Sleeve   Pt Expectation of Surgery/ Goals: to lose weight and keep it off   6th SWL Appointment of 7   NUTRITION ASSESSMENT  Anthropometrics  Start weight at [facility]: 246.1 lbs (date: 09/09/2018) Today's weight: 249 lbs BMI: 46.29 kg/m2    Lifestyle & Dietary Hx  Pt arrives having maintained weight. Pt states the phentermine caused her menstrual cycle to be 11 days late and was painful so has not been taking it. Pt stets she no longer feels guilty when she eats which helps her to make healthier choices. Pt used to believe when she ate a healthy breakfast it would be a Good start so to keep down the calories and not ruin what she did for breakfast would ignore the hunger and not eat lunch. Pt states: "I can do this" "I feel free" (referneceing food thoughts and guilt that run her day) even with emotionally dealing with her aunt has not eaten chocolate recognizing when she truly believes she can eat those foods she will not feel the need to overindulge.  Pt is doing wonderfully!!   24-Hr Dietary Recall First Meal: 2 eggs + bacon + onion + pepper or hummus and tomatoes and broccoli  Snack: - Second Meal: -  Apple and cheese crackers  Snack: hummus + tomatoes  Third Meal 8pm: seafood + potatoes + plantains or chicken wings + rice + vegetables  Snack: wine or chocolate or ice cream Beverages: water, orange juice   Estimated Energy Needs Calories: 1600-1800 Carbohydrate: 180-200g Protein: 100-113g Fat: 53-60g   NUTRITION DIAGNOSIS  Overweight/obesity ([place]-3.3) related to past poor dietary habits and physical inactivity as evidenced by patient w/ planned Sleeve Gastrectomy surgery following dietary guidelines for continued weight loss.   NUTRITION INTERVENTION  Nutrition counseling (C-1) and education (E-2) to facilitate bariatric surgery goals.  Pre-Op Goals Progress & New Goals . Continue working on  adding 1 more meal/snack during the day . Continue working on stress eating . Chew each bite thoroughly to applesauce consistency before swallowing  . identify different stresses leading you to use food to cope . find new things to do to deal with stress rather than eat such as walking the stars, calling your sister, going for a walk, etc. . work on re framing that negative talk to yourself and turn it into a positive: You can do this! It will be difficult but not impossible! [employer]! You have so much to be proud of!  Learning Style & Readiness for Change Teaching method utilized: Visual & Auditory  Demonstrated degree of understanding via: Teach Back  Barriers to learning/adherence to lifestyle change: emotional eating   MONITORING & EVALUATION Dietary intake, weekly physical activity, body weight, and pre-op goals at next nutrition visit.   Next Steps  Patient is to return to [facility] for 7th [facility] Visit.

## 2019-12-21 ENCOUNTER — Other Ambulatory Visit: Payer: Self-pay

## 2019-12-21 ENCOUNTER — Encounter: Payer: No Typology Code available for payment source | Admitting: Skilled Nursing Facility1

## 2019-12-21 DIAGNOSIS — E669 Obesity, unspecified: Secondary | ICD-10-CM | POA: Diagnosis not present

## 2019-12-21 NOTE — Progress Notes (Signed)
Supervised Weight Loss Visit Bariatric Nutrition Education  Planned Surgery: Sleeve   Pt Expectation of Surgery/ Goals: to lose weight and keep it off   7th SWL Appointment of 7   NUTRITION ASSESSMENT  Anthropometrics  Start weight at [facility]: 246.1 lbs (date: 09/09/2018) Today's weight: 248 lbs BMI: 46.16 kg/m2    Lifestyle & Dietary Hx  Pt is doing wonderfully!!   Pt states her daughter is 30 year old and has been weighing every day so she explained to her how unhealthy that is and will no longer let her weigh. Pt states she challenges her daughter for activity and gives her a reward when she does well.  Pt states in these last 7 vitis she has accomplished: finding it important to eat breakfast which helps her to not be starving for dinner, learning the difference between hunger and other things, understanding my relationship with food, allowing herself to be comfortable with food  Pt states she might struggle with: chocolate cravings (understadning I will need to work on this),    24-Hr Dietary Recall First Meal: 2 eggs + bacon + onion + pepper or hummus and tomatoes and broccoli  Snack: - Second Meal: -  Apple and cheese crackers  Snack: hummus + tomatoes  Third Meal 8pm: seafood + potatoes + plantains or chicken wings + rice + vegetables  Snack: wine or chocolate or ice cream Beverages: water, orange juice   Estimated Energy Needs Calories: 1600-1800 Carbohydrate: 180-200g Protein: 100-113g Fat: 53-60g   NUTRITION DIAGNOSIS  Overweight/obesity ([place]-3.3) related to past poor dietary habits and physical inactivity as evidenced by patient w/ planned Sleeve Gastrectomy surgery following dietary guidelines for continued weight loss.   NUTRITION INTERVENTION  Nutrition counseling (C-1) and education (E-2) to facilitate bariatric surgery goals.  Pre-Op Goals Progress & New Goals . Continue working on adding 1 more meal/snack during the day . Continue working on stress  eating . Chew each bite thoroughly to applesauce consistency before swallowing  . identify different stresses leading you to use food to cope . find new things to do to deal with stress rather than eat such as walking the stars, calling your sister, going for a walk, etc. . work on re framing that negative talk to yourself and turn it into a positive: You can do this! It will be difficult but not impossible! [employer]! You have so much to be proud of!  Learning Style & Readiness for Change Teaching method utilized: Visual & Auditory  Demonstrated degree of understanding via: Teach Back  Barriers to learning/adherence to lifestyle change: emotional eating   MONITORING & EVALUATION Dietary intake, weekly physical activity, body weight, and pre-op goals at next nutrition visit.   Next Steps  Patient is to return to [facility]

## 2019-12-25 ENCOUNTER — Encounter: Payer: No Typology Code available for payment source | Admitting: Skilled Nursing Facility1

## 2019-12-25 ENCOUNTER — Other Ambulatory Visit: Payer: Self-pay

## 2019-12-25 DIAGNOSIS — E669 Obesity, unspecified: Secondary | ICD-10-CM | POA: Diagnosis not present

## 2019-12-25 NOTE — Progress Notes (Signed)
Pre-Operative Nutrition Class:  Appt start time: 2863   End time:  1830.  Patient was seen on 12/25/2019 for Pre-Operative Bariatric Surgery Education [facility].    Surgery date:  Surgery type: sleeve Start weight at [facility]: 246 Weight today: 249.7  Samples given per MNT protocol. Patient educated on appropriate usage: CelebrateMultivitamin Lot #[id number] Exp:10/2020  celebrateCalcium  Lot #[id number] st Exp:02/2021  premierProtein Shake Lot #[id number] Exp:03/10/20 The following the learning objectives were met by the patient during this course:  Identify Pre-Op Dietary Goals and will begin 2 weeks pre-operatively  Identify appropriate sources of fluids and proteins   State protein recommendations and appropriate sources pre and post-operatively  Identify Post-Operative Dietary Goals and will follow for 2 weeks post-operatively  Identify appropriate multivitamin and calcium sources  Describe the need for physical activity post-operatively and will follow MD recommendations  State when to call healthcare provider regarding medication questions or post-operative complications  Handouts given during class include:  Pre-Op Bariatric Surgery Diet Handout  Protein Shake Handout  Post-Op Bariatric Surgery Nutrition Handout  BELT Program Information Flyer  Support Group Information Flyer  [facility] Bariatric Supplements Price List  Follow-Up Plan: Patient will follow-up at [facility] 2 weeks post operatively for diet advancement per MD.

## 2020-02-28 ENCOUNTER — Ambulatory Visit (INDEPENDENT_AMBULATORY_CARE_PROVIDER_SITE_OTHER): Payer: No Typology Code available for payment source | Admitting: Lactation Services

## 2020-02-28 ENCOUNTER — Encounter: Payer: Self-pay | Admitting: Lactation Services

## 2020-02-28 ENCOUNTER — Other Ambulatory Visit: Payer: Self-pay

## 2020-02-28 DIAGNOSIS — Z9989 Dependence on other enabling machines and devices: Secondary | ICD-10-CM | POA: Insufficient documentation

## 2020-02-28 DIAGNOSIS — G4733 Obstructive sleep apnea (adult) (pediatric): Secondary | ICD-10-CM | POA: Insufficient documentation

## 2020-02-28 DIAGNOSIS — Z3201 Encounter for pregnancy test, result positive: Secondary | ICD-10-CM

## 2020-02-28 LAB — POCT PREGNANCY, URINE: Preg Test, Ur: POSITIVE — AB

## 2020-02-28 NOTE — Progress Notes (Signed)
Patient seen and assessed by nursing staff.  Agree with documentation and plan.  

## 2020-02-28 NOTE — Addendum Note (Signed)
Addended by: Ed Blalock on: 02/28/2020 05:00 PM   Modules accepted: Orders

## 2020-02-28 NOTE — Progress Notes (Addendum)
Patient dropped off urine specimen. UPT +   Called patient to inform her of results. Mobile number went to voicemail, LM for patient to call the office for results. Called home phone listed and said [employer] prescriber not available. Will send My Chart Message.   Patient called office back around 2:15 and LM that we can leave a message on her voicemail.   Called patient at 4:25 pm. Patient reports she thinks her LMP was "around" May 27 making EDD 09/13/2019. She reports she had normal periods that started around the 7th of the month and the one for May was about 3 weeks later than normal. She reports it was lighter and shorter than normal for her. Patient reports she can feel the infant move for the last 2 weeks indicating that she may be further along that indicated per LMP given. She reports history of GDM with 2nd child and induction around 37 weeks. Reviewed with Marylynn Pearson RN and agreed an [place] would be warranted for dating.   She is having cramping but no bleeding. She has been taking PNV since + UPT at home the end of July. Reviewed with patient that we will schedule an [place] for dating and I will call her back. Message to front office to call patient to schedule for 1st OB visit. Called Radiology and scheduled for [place] on 8/24 at 9 am. Patient to arrive at 08:45 with full bladder. Patient voiced understanding.

## 2020-02-29 NOTE — Progress Notes (Signed)
Patient seen and assessed by nursing staff.  Agree with documentation and plan.  

## 2020-03-05 ENCOUNTER — Ambulatory Visit
Admission: RE | Admit: 2020-03-05 | Discharge: 2020-03-05 | Disposition: A | Discharge status: Home / Self Care | Payer: No Typology Code available for payment source | Source: Ambulatory Visit | Attending: Family Medicine | Admitting: Family Medicine

## 2020-03-05 ENCOUNTER — Other Ambulatory Visit: Payer: Self-pay | Admitting: Family Medicine

## 2020-03-05 ENCOUNTER — Other Ambulatory Visit: Payer: Self-pay

## 2020-03-05 DIAGNOSIS — Z3201 Encounter for pregnancy test, result positive: Secondary | ICD-10-CM

## 2020-03-05 DIAGNOSIS — O26841 Uterine size-date discrepancy, first trimester: Secondary | ICD-10-CM | POA: Diagnosis not present

## 2020-03-05 DIAGNOSIS — Z3A13 13 weeks gestation of pregnancy: Secondary | ICD-10-CM | POA: Diagnosis not present

## 2020-03-21 ENCOUNTER — Other Ambulatory Visit: Payer: Self-pay

## 2020-03-21 ENCOUNTER — Encounter: Payer: Self-pay | Admitting: Obstetrics and Gynecology

## 2020-03-21 ENCOUNTER — Ambulatory Visit (INDEPENDENT_AMBULATORY_CARE_PROVIDER_SITE_OTHER): Payer: No Typology Code available for payment source | Admitting: Obstetrics and Gynecology

## 2020-03-21 ENCOUNTER — Other Ambulatory Visit (HOSPITAL_COMMUNITY)
Admission: RE | Admit: 2020-03-21 | Discharge: 2020-03-21 | Disposition: A | Discharge status: Home / Self Care | Payer: No Typology Code available for payment source | Source: Ambulatory Visit | Attending: Obstetrics and Gynecology | Admitting: Obstetrics and Gynecology

## 2020-03-21 VITALS — BP 113/62 | HR 88 | Wt 247.0 lb

## 2020-03-21 DIAGNOSIS — Z3480 Encounter for supervision of other normal pregnancy, unspecified trimester: Secondary | ICD-10-CM | POA: Diagnosis not present

## 2020-03-21 DIAGNOSIS — Z1331 Encounter for screening for depression: Secondary | ICD-10-CM

## 2020-03-21 DIAGNOSIS — Z3A15 15 weeks gestation of pregnancy: Secondary | ICD-10-CM | POA: Insufficient documentation

## 2020-03-21 DIAGNOSIS — Z6841 Body Mass Index (BMI) 40.0 and over, adult: Secondary | ICD-10-CM

## 2020-03-21 DIAGNOSIS — Z3482 Encounter for supervision of other normal pregnancy, second trimester: Secondary | ICD-10-CM | POA: Insufficient documentation

## 2020-03-21 DIAGNOSIS — Z98891 History of uterine scar from previous surgery: Secondary | ICD-10-CM

## 2020-03-21 DIAGNOSIS — O0990 Supervision of high risk pregnancy, unspecified, unspecified trimester: Secondary | ICD-10-CM | POA: Insufficient documentation

## 2020-03-21 MED ORDER — ASPIRIN 81 MG PO CHEW: 81.0000 mg | CHEWABLE_TABLET | Freq: Every day | ORAL | 10 refills | Status: DC

## 2020-03-21 NOTE — Progress Notes (Signed)
Positive depression screen, patient stated that she has a history of depression. Left message for patient to call the office in regards to not answering question 9 on PHQ-9.  Ladona Ridgel, RN

## 2020-03-21 NOTE — Progress Notes (Deleted)
INITIAL PRENATAL VISIT NOTE  Subjective:  Colleen Fields is a 29 y.o. G3P2002 at [redacted]w[redacted]d by ultrasound  being seen today for her initial prenatal visit. This is a unplanned pregnancy.  She was using nothing for birth control previously. She has an obstetric history significant for gestational diabetes. She has a medical history significant for obesity.  Patient reports {sx:14538}.   .  .   . Denies leaking of fluid.    Past Medical History:  Diagnosis Date  . Chest pain   . Complication of anesthesia    problems with epidural anesthesia during cs  . Depression   . Gestational diabetes    insulin  . Migraine   . Palpitations   . Tachycardia     Past Surgical History:  Procedure Laterality Date  . CESAREAN SECTION    . WISDOM TOOTH EXTRACTION      OB History  Gravida Para Term Preterm AB Living  3 2 2  0   2  SAB TAB Ectopic Multiple Live Births        0 2    # Outcome Date GA Lbr Len/2nd Weight Sex Delivery Anes PTL Lv  3 Current           2 Term 01/23/18 [redacted]w[redacted]d / 00:17 7 lb 1.1 oz (3.206 kg) M VBAC EPI  LIV  1 Term 12/03/09   6 lb 12 oz (3.062 kg) F CS-LTranv Spinal  LIV     Complications: Failure to Progress in First Stage    Social History   Socioeconomic History  . Marital status: Married    Spouse name: Not on file  . Number of children: Not on file  . Years of education: Not on file  . Highest education level: Not on file  Occupational History  . Not on file  Tobacco Use  . Smoking status: Never Smoker  . Smokeless tobacco: Never Used  Vaping Use  . Vaping Use: Never used  Substance and Sexual Activity  . Alcohol use: No  . Drug use: No    Types: Marijuana    Comment: "hasn't smoked in forever"  . Sexual activity: Yes    Birth control/protection: None  Other Topics Concern  . Not on file  Social History Narrative  . Not on file   Social Determinants of Health   Financial Resource Strain:   . Difficulty of Paying Living Expenses: Not on  file  Food Insecurity:   . Worried About 12/05/09 in the Last Year: Not on file  . Ran Out of Food in the Last Year: Not on file  Transportation Needs:   . Lack of Transportation (Medical): Not on file  . Lack of Transportation (Non-Medical): Not on file  Physical Activity:   . Days of Exercise per Week: Not on file  . Minutes of Exercise per Session: Not on file  Stress:   . Feeling of Stress : Not on file  Social Connections:   . Frequency of Communication with Friends and Family: Not on file  . Frequency of Social Gatherings with Friends and Family: Not on file  . Attends Religious Services: Not on file  . Active Member of Clubs or Organizations: Not on file  . Attends Programme researcher, broadcasting/film/video Meetings: Not on file  . Marital Status: Not on file    Family History  Problem Relation Age of Onset  . Hypertension Mother   . Bipolar disorder Father   . Schizophrenia Father  Current Outpatient Medications:  .  Prenatal Vit-Fe Fumarate-FA (PRENATAL MULTIVITAMIN) TABS tablet, Take 1 tablet by mouth daily at 12 noon., Disp: , Rfl:  .  magnesium 30 MG tablet, Take 30 mg by mouth 2 (two) times daily. (Patient not taking: Reported on 03/21/2020), Disp: , Rfl:   Allergies  Allergen Reactions  . Latex Itching and Other (See Comments)    Causes burning.    Review of Systems: Negative except for what is mentioned in HPI.  Objective:   Vitals:   03/21/20 1450  Weight: 247 lb (112 kg)    Fetal Status: Fetal Heart Rate (bpm): 155         Physical Exam: Wt 247 lb (112 kg)   LMP 12/07/2019 (Exact Date)   BMI 45.91 kg/m  CONSTITUTIONAL: Well-developed, well-nourished female in ***no acute distress.  NEUROLOGIC: Alert and oriented to person, place, and time. Normal reflexes, muscle tone coordination. No cranial nerve deficit noted. PSYCHIATRIC: Normal mood and affect. Normal behavior. Normal judgment and thought content. SKIN: Skin is warm and dry. No rash noted. Not  diaphoretic. No erythema. No pallor. HENT:  Normocephalic, atraumatic, External right and left ear normal. Oropharynx is clear and moist EYES: Conjunctivae and EOM are normal. Pupils are equal, round, and reactive to light. No scleral icterus.  NECK: Normal range of motion, supple, no masses CARDIOVASCULAR: Normal heart rate noted, regular rhythm RESPIRATORY: Effort and breath sounds normal, no problems with respiration noted BREASTS: symmetric, ***non-tender, no masses palpable ABDOMEN: Soft, ***nontender, ***nondistended, gravid. GU: ***normal appearing external female genitalia, ***multiparous***nulliparous ***normal appearing cervix, scant white discharge in vagina, no lesions noted Bimanual: *** weeks sized uterus, no adnexal tenderness or palpable lesions noted MUSCULOSKELETAL: Normal range of motion. EXT:  No edema and no tenderness. 2+ distal pulses.   Assessment and Plan:  Pregnancy: G3P2002 at [redacted]w[redacted]d by ***LMP  1. Supervision of other normal pregnancy, antepartum *** - Hemoglobin A1c - Culture, OB Urine - CBC/D/Plt+RPR+Rh+ABO+Rub Ab... - Cytology - PAP( [place]) - [facility] AMB BABYSCRIPTS SCHEDULE OPTIMIZATION  2. Supervision of high risk pregnancy, antepartum ***   {Blank single:19197::"Term","Preterm"} labor symptoms and general obstetric precautions including but not limited to vaginal bleeding, contractions, leaking of fluid and fetal movement were reviewed in detail with the patient.  Please refer to After Visit Summary for other counseling recommendations.   No follow-ups on file.  Warden Fillers 03/21/2020 2:52 PM

## 2020-03-21 NOTE — Progress Notes (Signed)
INITIAL PRENATAL VISIT NOTE  Subjective:  Colleen Fields is a 29 y.o. G3P2002 at [redacted]w[redacted]d by early u/s being seen today for her initial prenatal visit. This is a unplanned pregnancy.  She was using nothing for birth control previously. She has an obstetric history significant for gestational diabetes. She has a medical history significant for obesity. The pt has a history of previous cesarean section and gestational diabetes.  She has had a successful VBAC.  Currently she is unsure about tubal ligation or IUD/nexplanon.  The pt had gestational diabetes which was controlled by diet but then she transitioned to insulin control.  Patient reports no complaints.  Contractions: Not present. Vag. Bleeding: None.  Movement: Absent. Denies leaking of fluid.    Past Medical History:  Diagnosis Date  . Chest pain   . Complication of anesthesia    problems with epidural anesthesia during cs  . Depression   . Gestational diabetes    insulin  . Migraine   . Palpitations   . Tachycardia     Past Surgical History:  Procedure Laterality Date  . CESAREAN SECTION    . WISDOM TOOTH EXTRACTION      OB History  Gravida Para Term Preterm AB Living  3 2 2  0   2  SAB TAB Ectopic Multiple Live Births        0 2    # Outcome Date GA Lbr Len/2nd Weight Sex Delivery Anes PTL Lv  3 Current           2 Term 01/23/18 [redacted]w[redacted]d / 00:17 7 lb 1.1 oz (3.206 kg) M VBAC EPI  LIV  1 Term 12/03/09   6 lb 12 oz (3.062 kg) F CS-LTranv Spinal  LIV     Complications: Failure to Progress in First Stage    Social History   Socioeconomic History  . Marital status: Married    Spouse name: Not on file  . Number of children: Not on file  . Years of education: Not on file  . Highest education level: Not on file  Occupational History  . Not on file  Tobacco Use  . Smoking status: Never Smoker  . Smokeless tobacco: Never Used  Vaping Use  . Vaping Use: Never used  Substance and Sexual Activity  . Alcohol use:  No  . Drug use: No    Types: Marijuana    Comment: "hasn't smoked in forever"  . Sexual activity: Yes    Birth control/protection: None  Other Topics Concern  . Not on file  Social History Narrative  . Not on file   Social Determinants of Health   Financial Resource Strain:   . Difficulty of Paying Living Expenses: Not on file  Food Insecurity:   . Worried About 12/05/09 in the Last Year: Not on file  . Ran Out of Food in the Last Year: Not on file  Transportation Needs:   . Lack of Transportation (Medical): Not on file  . Lack of Transportation (Non-Medical): Not on file  Physical Activity:   . Days of Exercise per Week: Not on file  . Minutes of Exercise per Session: Not on file  Stress:   . Feeling of Stress : Not on file  Social Connections:   . Frequency of Communication with Friends and Family: Not on file  . Frequency of Social Gatherings with Friends and Family: Not on file  . Attends Religious Services: Not on file  . Active Member of  Clubs or Organizations: Not on file  . Attends Banker Meetings: Not on file  . Marital Status: Not on file    Family History  Problem Relation Age of Onset  . Hypertension Mother   . Bipolar disorder Father   . Schizophrenia Father      Current Outpatient Medications:  .  Prenatal Vit-Fe Fumarate-FA (PRENATAL MULTIVITAMIN) TABS tablet, Take 1 tablet by mouth daily at 12 noon., Disp: , Rfl:  .  aspirin 81 MG chewable tablet, Chew 1 tablet (81 mg total) by mouth daily., Disp: 30 tablet, Rfl: 10 .  magnesium 30 MG tablet, Take 30 mg by mouth 2 (two) times daily. (Patient not taking: Reported on 03/21/2020), Disp: , Rfl:   Allergies  Allergen Reactions  . Latex Itching and Other (See Comments)    Causes burning.    Review of Systems: Negative except for what is mentioned in HPI.  Objective:   Vitals:   03/21/20 1450  BP: 113/62  Pulse: 88  Weight: 247 lb (112 kg)    Fetal Status: Fetal Heart  Rate (bpm): 155   Movement: Absent     Physical Exam: BP 113/62   Pulse 88   Wt 247 lb (112 kg)   LMP 12/07/2019 (Exact Date)   BMI 45.91 kg/m  CONSTITUTIONAL: Well-developed, well-nourished female in no acute distress.  NEUROLOGIC: Alert and oriented to person, place, and time. Normal reflexes, muscle tone coordination. No cranial nerve deficit noted. PSYCHIATRIC: Normal mood and affect. Normal behavior. Normal judgment and thought content. SKIN: Skin is warm and dry. No rash noted. Not diaphoretic. No erythema. No pallor. HENT:  Normocephalic, atraumatic, External right and left ear normal. Oropharynx is clear and moist EYES: Conjunctivae and EOM are normal. Pupils are equal, round, and reactive to light. No scleral icterus.  NECK: Normal range of motion, supple, no masses CARDIOVASCULAR: Normal heart rate noted, regular rhythm RESPIRATORY: Effort and breath sounds normal, no problems with respiration noted BREASTS: not assessed ABDOMEN: Soft, nontender, nondistended, gravid, obese. GU: normal appearing external female genitalia, multiparous, normal appearing cervix, scant white discharge in vagina, no lesions noted, pap taken Bimanual: 14 weeks sized uterus, no adnexal tenderness or palpable lesions noted MUSCULOSKELETAL: Normal range of motion. EXT:  No edema and no tenderness. 2+ distal pulses.   Assessment and Plan:  Pregnancy: G3P2002 at [redacted]w[redacted]d by early u/s  1. Supervision of other normal pregnancy, antepartu, Pt declines both the flu and covid vaccines - Hemoglobin A1c - Culture, OB Urine - CBC/D/Plt+RPR+Rh+ABO+Rub Ab... - CHL AMB BABYSCRIPTS SCHEDULE OPTIMIZATION - aspirin 81 MG chewable tablet; Chew 1 tablet (81 mg total) by mouth daily.  Dispense: 30 tablet; Refill: 10 - [place] MFM OB DETAIL +14 WK; Future - Cytology - PAP( [place])  2. Supervision of high risk pregnancy, antepartum Will get early GTT and start baby ASA  3. History of cesarean delivery Hx of  successful VBAC, pt will decide on route of delivery  4. Class 3 severe obesity without serious comorbidity with body mass index (BMI) of 45.0 to 49.9 in adult, unspecified obesity type (HCC) Monitor weight gain  5. [redacted] weeks gestation of pregnancy    Preterm labor symptoms and general obstetric precautions including but not limited to vaginal bleeding, contractions, leaking of fluid and fetal movement were reviewed in detail with the patient.  Please refer to After Visit Summary for other counseling recommendations.   Return in about 4 weeks (around 04/18/2020) for [facility], in person.  Lyman Bishop  A Wessie Shanks 03/21/2020 3:32 PM

## 2020-03-21 NOTE — Patient Instructions (Signed)

## 2020-03-22 LAB — CBC/D/PLT+RPR+RH+ABO+RUB AB...
Antibody Screen: NEGATIVE
Basophils Absolute: 0.1 10*3/uL (ref 0.0–0.2)
Basos: 1 %
EOS (ABSOLUTE): 0.1 10*3/uL (ref 0.0–0.4)
Eos: 1 %
HCV Ab: <0.1 s/co ratio (ref 0.0–0.9)
HIV Screen 4th Generation wRfx: NONREACTIVE
Hematocrit: 32.8 % — ABNORMAL LOW (ref 34.0–46.6)
Hemoglobin: 11.4 g/dL (ref 11.1–15.9)
Hepatitis B Surface Ag: NEGATIVE
Immature Grans (Abs): 0.1 10*3/uL (ref 0.0–0.1)
Immature Granulocytes: 1 %
Lymphocytes Absolute: 1.9 10*3/uL (ref 0.7–3.1)
Lymphs: 28 %
MCH: 31.1 pg (ref 26.6–33.0)
MCHC: 34.8 g/dL (ref 31.5–35.7)
MCV: 90 fL (ref 79–97)
Monocytes Absolute: 0.4 10*3/uL (ref 0.1–0.9)
Monocytes: 7 %
Neutrophils Absolute: 4.2 10*3/uL (ref 1.4–7.0)
Neutrophils: 62 %
Platelets: 243 10*3/uL (ref 150–450)
RBC: 3.66 x10E6/uL — ABNORMAL LOW (ref 3.77–5.28)
RDW: 13.6 % (ref 11.7–15.4)
RPR Ser Ql: NONREACTIVE
Rh Factor: POSITIVE
Rubella Antibodies, IGG: 2.98 index (ref >0.99)
WBC: 6.7 10*3/uL (ref 3.4–10.8)

## 2020-03-22 LAB — HEMOGLOBIN A1C
Est. average glucose Bld gHb Est-mCnc: 117 mg/dL
Hgb A1c MFr Bld: 5.7 % — ABNORMAL HIGH (ref 4.8–5.6)

## 2020-03-22 LAB — HCV INTERPRETATION

## 2020-03-23 LAB — URINE CULTURE, OB REFLEX

## 2020-03-23 LAB — CULTURE, OB URINE

## 2020-03-25 LAB — CYTOLOGY - PAP
Chlamydia: NEGATIVE
Comment: NEGATIVE
Comment: NEGATIVE
Comment: NEGATIVE
Comment: NORMAL
Diagnosis: NEGATIVE
High risk HPV: NEGATIVE
Neisseria Gonorrhea: NEGATIVE
Trichomonas: NEGATIVE

## 2020-04-11 NOTE — BH Specialist Note (Signed)
Integrated Behavioral Health via Telemedicine Video (Caregility) Visit  04/11/2020 Colleen Fields [id number]  Number of Integrated Behavioral Health visits: 1 (total 3; last seen 03/02/18) Session Start time: 2:18  Session End time: 2:55 Total time: 37 minutes  Referring Provider: Mariel Aloe, MD Type of Service: Individual Patient/Family location: [facility] Provider location: [facility]  All persons participating in visit: Patient Colleen Fields and [facility] Hulda Marin     I connected with Colleen Fields  by a video enabled telemedicine application (Caregility) and verified that I am speaking with the correct person using two identifiers.   Discussed confidentiality: Yes   Confirmed demographics & insurance:  Yes   I discussed that engaging in this virtual visit, they consent to the provision of behavioral healthcare and the services will be billed under their insurance.   Patient and/or legal guardian expressed understanding and consented to virtual visit: Yes   PRESENTING CONCERNS: Patient and/or family reports the following symptoms/concerns: Pt states her primary concern today is an increase in anxiety and stress, attributed to going through recent separation from husband; pt is adjusting to managing household and working fulltime from home during current pregnancy on her own. Pt has a goal of being more prepared for this birth.  Duration of problem: Current pregnancy; Severity of problem: severe  STRENGTHS (Protective Factors/Coping Skills): Supportive mother, open to non-pharmacological treatment; uses self-coping skills daily  ASSESSMENT: Patient currently experiencing Adjustment disorder with mixed anxiety and depression and Psychosocial stress   GOALS ADDRESSED: Patient will: 1.  Reduce symptoms of: anxiety, depression and stress  2.  Increase knowledge and/or ability of: healthy habits and stress  reduction  3.  Demonstrate ability to: Increase healthy adjustment to current life circumstances and Increase adequate support systems for patient/family   Progress of Goals: Ongoing  INTERVENTIONS: Interventions utilized:  Solution-Focused Strategies, Psychoeducation and/or Health Education and Link to [employer] Assessments completed & reviewed: Not given today   OUTCOME: Patient Response: Pt agrees to treatment plan   PLAN: 1. Follow up with behavioral health clinician on : One month 2. Behavioral recommendations:  -Accept WIC referral -Go to [email or web address] to view hospital virtual tour, and sign up for at least one childbirth education class  -Continue with plan to attend ongoing therapy sessions -Continue using self-coping strategies that have helped manage symptoms in the past -Continue taking prenatal vitamin daily, as recommended by medical provider  3. Referral(s): Integrated Art gallery manager (In Clinic) and Community Resources:  [facility]  I discussed the assessment and treatment plan with the patient and/or parent/guardian. They were provided an opportunity to ask questions and all were answered. They agreed with the plan and demonstrated an understanding of the instructions.   They were advised to call back or seek an in-person evaluation as appropriate.  I discussed that the purpose of this visit is to provide behavioral health care while limiting exposure to the novel coronavirus.  Discussed there is a possibility of technology failure and discussed alternative modes of communication if that failure occurs.  Valetta Close [facility]  Depression screen [facility] 2/9 03/22/2020 09/09/2018 01/20/2018 01/05/2018 11/19/2017  Decreased Interest 2 1 3 3 3   Down, Depressed, Hopeless 2 0 2 2 3   PHQ - 2 Score 4 1 5 5 6   Altered sleeping 2 - 3 3 3   Tired, decreased energy 2 - 3 3 3   Change in appetite 2 - 3 3 3   Feeling  bad or failure about yourself  2 - 2  3 1   Trouble concentrating 2 - 3 3 0  Moving slowly or fidgety/restless 2 - 2 0 3  Suicidal thoughts 0 - 0 0 0  PHQ-9 Score 16 - 21 20 19    GAD 7 : Generalized Anxiety Score 03/22/2020 01/20/2018 01/05/2018 11/19/2017  Nervous, Anxious, on Edge 3 3 3 3   Control/stop worrying 3 3 3 3   Worry too much - different things 3 3 3 3   Trouble relaxing 3 3 3 3   Restless 3 0 2 0  Easily annoyed or irritable 3 3 3 3   Afraid - awful might happen 3 3 3 3   Total GAD 7 Score 21 18 20  18

## 2020-04-15 ENCOUNTER — Ambulatory Visit (INDEPENDENT_AMBULATORY_CARE_PROVIDER_SITE_OTHER): Payer: No Typology Code available for payment source | Admitting: Clinical

## 2020-04-15 DIAGNOSIS — F4323 Adjustment disorder with mixed anxiety and depressed mood: Secondary | ICD-10-CM

## 2020-04-15 DIAGNOSIS — Z1331 Encounter for screening for depression: Secondary | ICD-10-CM

## 2020-04-15 DIAGNOSIS — Z658 Other specified problems related to psychosocial circumstances: Secondary | ICD-10-CM

## 2020-04-15 NOTE — Patient Instructions (Addendum)
[facility] [address] [place], [place] [address] [phone] (main office) [phone] [facility] office)  [email or web address] Scientist, water quality tour, childbirth education, postpartum support groups)

## 2020-04-18 ENCOUNTER — Encounter: Payer: No Typology Code available for payment source | Admitting: Obstetrics and Gynecology

## 2020-04-18 ENCOUNTER — Encounter: Payer: No Typology Code available for payment source | Admitting: Obstetrics & Gynecology

## 2020-04-19 ENCOUNTER — Ambulatory Visit: Payer: No Typology Code available for payment source

## 2020-04-22 ENCOUNTER — Ambulatory Visit: Payer: Medicaid Other | Admitting: *Deleted

## 2020-04-22 ENCOUNTER — Ambulatory Visit: Payer: Medicaid Other | Attending: Obstetrics and Gynecology

## 2020-04-22 ENCOUNTER — Encounter: Payer: Self-pay | Admitting: *Deleted

## 2020-04-22 ENCOUNTER — Other Ambulatory Visit: Payer: Self-pay

## 2020-04-22 DIAGNOSIS — O0990 Supervision of high risk pregnancy, unspecified, unspecified trimester: Secondary | ICD-10-CM | POA: Diagnosis not present

## 2020-04-22 DIAGNOSIS — Z3480 Encounter for supervision of other normal pregnancy, unspecified trimester: Secondary | ICD-10-CM | POA: Diagnosis not present

## 2020-04-23 ENCOUNTER — Other Ambulatory Visit: Payer: Self-pay | Admitting: *Deleted

## 2020-04-23 DIAGNOSIS — Z8632 Personal history of gestational diabetes: Secondary | ICD-10-CM

## 2020-04-30 NOTE — BH Specialist Note (Deleted)
Integrated Behavioral Health via Telemedicine Video (Caregility) Visit  04/30/2020 Colleen Fields [id number]  Number of Integrated Behavioral Health visits: *** Session Start time: 2:15***  Session End time: 2:45*** Total time: {IBH Total Time:21014050} minutes  Referring Provider: *** Type of Service: Individual, Family, *** Patient/Family location: *** [facility] Provider location: *** All persons participating in visit: ***   I connected with Colleen Fields and/or Colleen Fields's {family members:20773} by a video enabled telemedicine application (Caregility) and verified that I am speaking with the correct person using two identifiers.   Discussed confidentiality: {YES/NO:21197}  Confirmed demographics & insurance:  {YES/NO:21197}  I discussed that engaging in this virtual visit, they consent to the provision of behavioral healthcare and the services will be billed under their insurance.   Patient and/or legal guardian expressed understanding and consented to virtual visit: {YES/NO:21197}  PRESENTING CONCERNS: Patient and/or family reports the following symptoms/concerns: *** Duration of problem: ***; Severity of problem: {Mild/Moderate/Severe:20260}  STRENGTHS (Protective Factors/Coping Skills): {CHL AMB BH PROTECTIVE FACTORS/STRENGTHS:3076416494}  ASSESSMENT: Patient currently experiencing ***.    GOALS ADDRESSED: Patient will: 1.  Reduce symptoms of: {IBH Symptoms:21014056}  2.  Increase knowledge and/or ability of: {IBH Patient Tools:21014057}  3.  Demonstrate ability to: {IBH Goals:21014053}   Progress of Goals: {CHL AMB BH PROGRESS TOWARDS WUJWJ:1914782956}  INTERVENTIONS: Interventions utilized:  {IBH Interventions:21014054} Standardized Assessments completed & reviewed: {IBH Screening Tools:21014051}   OUTCOME: Patient Response: ***   PLAN: 1. Follow up with behavioral health clinician on : *** 2. Behavioral recommendations:  *** 3. Referral(s): {IBH Referrals:21014055}  I discussed the assessment and treatment plan with the patient and/or parent/guardian. They were provided an opportunity to ask questions and all were answered. They agreed with the plan and demonstrated an understanding of the instructions.   They were advised to call back or seek an in-person evaluation as appropriate.  I discussed that the purpose of this visit is to provide behavioral health care while limiting exposure to the novel coronavirus.  Discussed there is a possibility of technology failure and discussed alternative modes of communication if that failure occurs.  Colleen Fields

## 2020-05-03 ENCOUNTER — Encounter: Payer: Self-pay | Admitting: Obstetrics and Gynecology

## 2020-05-03 ENCOUNTER — Other Ambulatory Visit: Payer: Self-pay

## 2020-05-03 ENCOUNTER — Ambulatory Visit (INDEPENDENT_AMBULATORY_CARE_PROVIDER_SITE_OTHER): Payer: No Typology Code available for payment source | Admitting: Obstetrics and Gynecology

## 2020-05-03 VITALS — BP 119/75 | HR 93 | Wt 245.0 lb

## 2020-05-03 DIAGNOSIS — Z98891 History of uterine scar from previous surgery: Secondary | ICD-10-CM

## 2020-05-03 DIAGNOSIS — O0992 Supervision of high risk pregnancy, unspecified, second trimester: Secondary | ICD-10-CM

## 2020-05-03 MED ORDER — COMFORT FIT MATERNITY SUPP LG MISC: 0 refills | Status: DC

## 2020-05-03 NOTE — Patient Instructions (Signed)
Places to have your son circumcised (for self-pay patients//Medicaid now covers circumcisions):                                                                      [facility]     [phone]   $480 while you are in hospital         [facility]              [phone]   $269 by 4 wks                      [facility]                     [phone]   $269 by 7 days [facility]                    [phone]   $269 by 4 wks [facility]             [phone]   $225 by 2 wks    These prices sometimes change but are roughly what you can expect to pay. Please call and confirm pricing.   There are many reasons parents decide to have their sons circumsized. During the first year of life circumcised males have a reduced risk of urinary tract infections but after this year the rates between circumcised males and uncircumcised males are the same.  It can reduce rate of HIV and other STI infection later in life.  It is safe to have your son circumcised outside of the hospital and the places above perform them regularly.   Deciding about Circumcision in Baby Boys  (Up-to-date The Basics)  What is circumcision?   Circumcision is a surgery that removes the skin that covers the tip of the penis, called the "foreskin" Circumcision is usually done when a boy is between 1 and 10 days old. In the [place], circumcision is common. In some other countries, fewer boys are circumcised. Circumcision is a common tradition in some religions.  Should I have my baby boy circumcised?   There is no easy answer. Circumcision has some benefits. But it also has risks. After talking with your doctor, you will have to decide for yourself what is right for your family.  What are the benefits of circumcision?   Circumcised boys seem to have slightly lower rates of: ?Urinary tract infections ?Swelling of the opening at the tip of the penis Circumcised men seem to have slightly lower rates of: ?Urinary tract infections ?Swelling of the  opening at the tip of the penis ?Penis cancer ?HIV and other infections that you catch during sex, or transmit to a future partner(s) ?Cervical cancer in the women they have sex with Even so, in the [place], the risks of these problems are small - even in boys and men who have not been circumcised. Plus, boys and men who are not circumcised can reduce these extra risks by: ?Cleaning their penis well ?Using condoms during sex  What are the risks of circumcision?  Risks include: ?Bleeding or infection from the surgery ?Damage to or amputation of the penis ?A chance that the doctor will cut off too much or not enough of the   foreskin ?A chance that sex won't feel as good later in life Only about 1 out of every 200 circumcisions leads to problems. There is also a chance that your health insurance won't pay for circumcision.  How is circumcision done in baby boys?  First, the baby gets medicine for pain relief. This might be a cream on the skin or a shot into the base of the penis. Next, the doctor cleans the baby's penis well. Then he or she uses special tools to cut off the foreskin. Finally, the doctor wraps a bandage (called gauze) around the baby's penis. If you have your baby circumcised, his doctor or nurse will give you instructions on how to care for him after the surgery. It is important that you follow those instructions carefully.  

## 2020-05-03 NOTE — Progress Notes (Signed)
   PRENATAL VISIT NOTE  Subjective:  Colleen Fields is a 29 y.o. G3P2002 at [redacted]w[redacted]d being seen today for ongoing prenatal care.  She is currently monitored for the following issues for this high-risk pregnancy and has Supervision of high risk pregnancy, antepartum; History of cesarean delivery; Obesity; Anxiety and depression; Supervision of high-risk pregnancy; and [redacted] weeks gestation of pregnancy on their problem list.  Patient reports backache.  Contractions: Not present. Vag. Bleeding: None.  Movement: Present. Denies leaking of fluid.   The following portions of the patient's history were reviewed and updated as appropriate: allergies, current medications, past family history, past medical history, past social history, past surgical history and problem list.   Objective:   Vitals:   05/03/20 1108  BP: 119/75  Pulse: 93  Weight: 245 lb (111.1 kg)    Fetal Status: Fetal Heart Rate (bpm): 168   Movement: Present     General:  Alert, oriented and cooperative. Patient is in no acute distress.  Skin: Skin is warm and dry. No rash noted.   Cardiovascular: Normal heart rate noted  Respiratory: Normal respiratory effort, no problems with respiration noted  Abdomen: Soft, gravid, appropriate for gestational age.  Pain/Pressure: Present     Pelvic: Cervical exam deferred        Extremities: Normal range of motion.  Edema: None  Mental Status: Normal mood and affect. Normal behavior. Normal judgment and thought content.   Assessment and Plan:  Pregnancy: G3P2002 at [redacted]w[redacted]d 1. History of cesarean delivery Patient desires TOLAC  2. Supervision of high risk pregnancy in second trimester Patient complaining of back pain Discussed benefits of maternity support belt and back stretching/strenghtening exercises Patient declined genetic screening test Patient with elevated PHQ9 score and agrees to speak with behavioral health at a later visit. She denies suicidal/homicidal ideations. She  declined a rx Patient has not been taking ASA. Education provided Follow up growth ultrasound   Preterm labor symptoms and general obstetric precautions including but not limited to vaginal bleeding, contractions, leaking of fluid and fetal movement were reviewed in detail with the patient. Please refer to After Visit Summary for other counseling recommendations.   Return in about 4 weeks (around 05/31/2020) for in person, ROB, Low risk.  Future Appointments  Date Time Provider Department Center  05/13/2020  2:15 PM [facility] HEALTH CLINICIAN [facility]  05/20/2020  2:30 PM [facility] NURSE [facility]  05/20/2020  2:45 PM [facility] US5 [facility]    Catalina Antigua, MD

## 2020-05-07 ENCOUNTER — Encounter: Payer: Self-pay | Admitting: General Practice

## 2020-05-20 ENCOUNTER — Ambulatory Visit: Payer: No Typology Code available for payment source | Attending: Obstetrics

## 2020-05-20 ENCOUNTER — Ambulatory Visit: Payer: No Typology Code available for payment source | Admitting: *Deleted

## 2020-05-20 ENCOUNTER — Other Ambulatory Visit: Payer: Self-pay

## 2020-05-20 ENCOUNTER — Encounter: Payer: Self-pay | Admitting: *Deleted

## 2020-05-20 VITALS — BP 126/70 | HR 114

## 2020-05-20 DIAGNOSIS — Z3A23 23 weeks gestation of pregnancy: Secondary | ICD-10-CM | POA: Diagnosis not present

## 2020-05-20 DIAGNOSIS — O09292 Supervision of pregnancy with other poor reproductive or obstetric history, second trimester: Secondary | ICD-10-CM

## 2020-05-20 DIAGNOSIS — Z8632 Personal history of gestational diabetes: Secondary | ICD-10-CM | POA: Diagnosis not present

## 2020-05-20 DIAGNOSIS — O34219 Maternal care for unspecified type scar from previous cesarean delivery: Secondary | ICD-10-CM | POA: Insufficient documentation

## 2020-05-20 DIAGNOSIS — O99212 Obesity complicating pregnancy, second trimester: Secondary | ICD-10-CM

## 2020-05-20 DIAGNOSIS — Z362 Encounter for other antenatal screening follow-up: Secondary | ICD-10-CM | POA: Diagnosis not present

## 2020-05-20 NOTE — BH Specialist Note (Signed)
Pt did not arrive to video visit and did not answer the phone ; Left HIPPA-compliant message to call back Johnpatrick Jenny from [facility] at [phone] (main office) or [phone] (Bawi Lakins's office).  ; left MyChart message for patient.      

## 2020-05-21 ENCOUNTER — Other Ambulatory Visit: Payer: Self-pay | Admitting: *Deleted

## 2020-05-21 DIAGNOSIS — Z98891 History of uterine scar from previous surgery: Secondary | ICD-10-CM

## 2020-05-23 ENCOUNTER — Ambulatory Visit: Payer: No Typology Code available for payment source | Admitting: Clinical

## 2020-05-23 DIAGNOSIS — Z5329 Procedure and treatment not carried out because of patient's decision for other reasons: Secondary | ICD-10-CM

## 2020-05-23 DIAGNOSIS — Z91199 Patient's noncompliance with other medical treatment and regimen due to unspecified reason: Secondary | ICD-10-CM

## 2020-05-29 ENCOUNTER — Ambulatory Visit (INDEPENDENT_AMBULATORY_CARE_PROVIDER_SITE_OTHER): Payer: No Typology Code available for payment source | Admitting: Student

## 2020-05-29 ENCOUNTER — Other Ambulatory Visit: Payer: Self-pay

## 2020-05-29 VITALS — BP 96/64 | HR 98 | Wt 245.7 lb

## 2020-05-29 DIAGNOSIS — F32A Depression, unspecified: Secondary | ICD-10-CM

## 2020-05-29 DIAGNOSIS — O0990 Supervision of high risk pregnancy, unspecified, unspecified trimester: Secondary | ICD-10-CM

## 2020-05-29 DIAGNOSIS — O0992 Supervision of high risk pregnancy, unspecified, second trimester: Secondary | ICD-10-CM

## 2020-05-29 DIAGNOSIS — Z3A24 24 weeks gestation of pregnancy: Secondary | ICD-10-CM

## 2020-05-29 NOTE — Progress Notes (Signed)
   PRENATAL VISIT NOTE  Subjective:  Colleen Fields is a 29 y.o. G3P2002 at [redacted]w[redacted]d being seen today for ongoing prenatal care.  She is currently monitored for the following issues for this high-risk pregnancy and has Supervision of high risk pregnancy, antepartum; History of cesarean delivery; Obesity; Anxiety and depression; Supervision of high-risk pregnancy; and [redacted] weeks gestation of pregnancy on their problem list.  Patient reports feeling tired. She is taking iron.  She wants to get on birth control but does nto want her husband to know because he is against birth control. She is interested in vbac.   Contractions: Irritability. Vag. Bleeding: None.  Movement: Present. Denies leaking of fluid.   The following portions of the patient's history were reviewed and updated as appropriate: allergies, current medications, past family history, past medical history, past social history, past surgical history and problem list.   Objective:   Vitals:   05/29/20 1610  BP: 96/64  Pulse: 98  Weight: 245 lb 11.2 oz (111.4 kg)    Fetal Status: Fetal Heart Rate (bpm): 164   Movement: Present   Measuring 24 weeks but difficult to palpate due to maternal body habitus.   General:  Alert, oriented and cooperative. Patient is in no acute distress.  Skin: Skin is warm and dry. No rash noted.   Cardiovascular: Normal heart rate noted  Respiratory: Normal respiratory effort, no problems with respiration noted  Abdomen: Soft, gravid, appropriate for gestational age.  Pain/Pressure: Present     Pelvic: Cervical exam deferred        Extremities: Normal range of motion.  Edema: None  Mental Status: Normal mood and affect. Normal behavior. Normal judgment and thought content.   Assessment and Plan:  Pregnancy: G3P2002 at [redacted]w[redacted]d  1. Supervision of high risk pregnancy, antepartum   2. Supervision of high risk pregnancy in second trimester   3. [redacted] weeks gestation of pregnancy   4. Depression, unspecified  depression type     -does not want nexplanon; afraid of IUD, will consider Ring, although afraid husband will feel.  Does not want BTL.  -keep follow up US on 12/13 for growth -sign TOLAC next visit -check CBC today for anemia -meet with Asher Muir after elevated depression score  Preterm labor symptoms and general obstetric precautions including but not limited to vaginal bleeding, contractions, leaking of fluid and fetal movement were reviewed in detail with the patient. Please refer to After Visit Summary for other counseling recommendations.   Return in about 4 weeks (around 06/26/2020), or 2 hour GTT and MD visit to sign VBAC papers.  Future Appointments  Date Time Provider Department Center  06/24/2020  2:30 PM [facility] NURSE [facility]  06/24/2020  2:45 PM [facility] US5 [facility]    Marylene Land, CNM

## 2020-05-29 NOTE — Patient Instructions (Signed)
Vaginal Birth After Cesarean Delivery  Vaginal birth after cesarean delivery (VBAC) is giving birth vaginally after previously delivering a baby through a cesarean section (C-section). A VBAC may be a safe option for you, depending on your health and other factors. It is important to discuss VBAC with your health care provider early in your pregnancy so you can understand the risks, benefits, and options. Having these discussions early will give you time to make your birth plan. Who are the best candidates for VBAC? The best candidates for VBAC are women who:  Have had one or two prior cesarean deliveries, and the incision made during the delivery was horizontal (low transverse).  Do not have a vertical (classical) scar on their uterus.  Have not had a tear in the wall of their uterus (uterine rupture).  Plan to have more pregnancies. A VBAC is also more likely to be successful:  In women who have previously given birth vaginally.  When labor starts by itself (spontaneously) before the due date. What are the benefits of VBAC? The benefits of delivering your baby vaginally instead of by a cesarean delivery include:  A shorter hospital stay.  A faster recovery time.  Less pain.  Avoiding risks associated with major surgery, such as infection and blood clots.  Less blood loss and less need for donated blood (transfusions). What are the risks of VBAC? The main risk of attempting a VBAC is that it may fail, forcing your health care provider to deliver your baby by a C-section. Other risks are rare and include:  Tearing (rupture) of the scar from a past cesarean delivery.  Other risks associated with vaginal deliveries. If a repeat cesarean delivery is needed, the risks include:  Blood loss.  Infection.  Blood clot.  Damage to surrounding organs.  Removal of the uterus (hysterectomy), if it is damaged.  Placenta problems in future pregnancies. What else should I know  about my options? Delivering a baby through a VBAC is similar to having a normal spontaneous vaginal delivery. Therefore, it is safe:  To try with twins.  For your health care provider to try to turn the baby from a breech position (external cephalic version) during labor.  With epidural analgesia for pain relief. Consider where you would like to deliver your baby. VBAC should be attempted in facilities where an emergency cesarean delivery can be performed. VBAC is not recommended for home births. Any changes in your health or your baby's health during your pregnancy may make it necessary to change your initial decision about VBAC. Your health care provider may recommend that you do not attempt a VBAC if:  Your baby's suspected weight is 8.8 lb (4 kg) or more.  You have preeclampsia. This is a condition that causes high blood pressure along with other symptoms, such as swelling and headaches.  You will have VBAC less than 19 months after your cesarean delivery.  You are past your due date.  You need to have labor started (induced) because your cervix is not ready for labor (unfavorable). Where to find more information  American Pregnancy Association: americanpregnancy.org  American Congress of Obstetricians and Gynecologists: acog.org Summary  Vaginal birth after cesarean delivery (VBAC) is giving birth vaginally after previously delivering a baby through a cesarean section (C-section). A VBAC may be a safe option for you, depending on your health and other factors.  Discuss VBAC with your health care provider early in your pregnancy so you can understand the risks, benefits, options, and   have plenty of time to make your birth plan.  The main risk of attempting a VBAC is that it may fail, forcing your health care provider to deliver your baby by a C-section. Other risks are rare. This information is not intended to replace advice given to you by your health care provider. Make sure  you discuss any questions you have with your health care provider. Document Revised: 10/25/2018 Document Reviewed: 10/06/2016 Elsevier Patient Education  2020 Elsevier Inc.  

## 2020-05-30 ENCOUNTER — Encounter: Payer: Self-pay | Admitting: *Deleted

## 2020-05-30 LAB — CBC
Hematocrit: 31.1 % — ABNORMAL LOW (ref 34.0–46.6)
Hemoglobin: 10.6 g/dL — ABNORMAL LOW (ref 11.1–15.9)
MCH: 31 pg (ref 26.6–33.0)
MCHC: 34.1 g/dL (ref 31.5–35.7)
MCV: 91 fL (ref 79–97)
Platelets: 286 10*3/uL (ref 150–450)
RBC: 3.42 x10E6/uL — ABNORMAL LOW (ref 3.77–5.28)
RDW: 13.7 % (ref 11.7–15.4)
WBC: 7.4 10*3/uL (ref 3.4–10.8)

## 2020-05-30 LAB — FERRITIN: Ferritin: 128 ng/mL (ref 15–150)

## 2020-05-31 ENCOUNTER — Encounter: Payer: Self-pay | Admitting: *Deleted

## 2020-06-03 NOTE — BH Specialist Note (Signed)
Integrated Behavioral Health via Telemedicine Video (Caregility) Visit  06/03/2020 [name] [id number]  Number of Integrated Behavioral Health visits: 4 Session Start time: 9:46  Session End time: 10:21 Total time: 35  minutes  Referring Provider: Mariel Aloe, MD Type of Service: Individual Patient/Family location: Home [facility] Provider location: [facility]  All persons participating in visit: Patient Colleen Fields and [facility] Marin    I connected with Colleen Fields  by a video enabled telemedicine application (Caregility) and verified that I am speaking with the correct person using two identifiers.   Discussed confidentiality: Yes   Confirmed demographics & insurance:  Yes   I discussed that engaging in this virtual visit, they consent to the provision of behavioral healthcare and the services will be billed under their insurance.   Patient and/or legal guardian expressed understanding and consented to virtual visit: Yes   PRESENTING CONCERNS: Patient and/or family reports the following symptoms/concerns: Pt state her primary concern today is sleep difficulty(attributes to back pain), fatigue, irritability  and "sharp pain in stomach" with stress. Pt prefers no medication in pregnancy,and has stopped seeing her therapist due to cost.  Duration of problem: Current pregnancy; Severity of problem: severe  STRENGTHS (Protective Factors/Coping Skills): Supportive mother; open to treatment   ASSESSMENT: Patient currently experiencing Mood disorder, unspecified.    GOALS ADDRESSED: Patient will: 1.  Reduce symptoms of: anxiety, depression and stress  2.  Increase knowledge and/or ability of: self-management skills  3.  Demonstrate ability to: Increase healthy adjustment to current life circumstances and Increase motivation to adhere to plan of care   Progress of  Goals: Revised  INTERVENTIONS: Interventions utilized:  CBT Cognitive Behavioral Therapy and Psychoeducation and/or Health Education Standardized Assessments completed & reviewed: PHQ9/GAD7 given in past two weeks   OUTCOME: Patient Response: Pt agrees to revised treatment plan   PLAN: 1. Follow up with behavioral health clinician on : Three weeks 2. Behavioral recommendations:  -Begin using Worry Time strategy daily for the next two weeks; will discuss outcome at next visit -Continue taking prenatal vitamin and iron as prescribed -Consider picking up Maternity belt, prescription sent to [facility] on 05/03/20 -Go to [email or web address] to sign up for childbirth education class and view hospital virtual tour -Call back [facility] to set up service -Consider resuming services with previous therapist as finances allow 3. Referral(s): Integrated Art gallery manager (In Clinic) and [employer]:  classes  I discussed the assessment and treatment plan with the patient and/or parent/guardian. They were provided an opportunity to ask questions and all were answered. They agreed with the plan and demonstrated an understanding of the instructions.   They were advised to call back or seek an in-person evaluation as appropriate.  I discussed that the purpose of this visit is to provide behavioral health care while limiting exposure to the novel coronavirus.  Discussed there is a possibility of technology failure and discussed alternative modes of communication if that failure occurs.  Valetta Close [facility]   Depression screen [facility] 2/9 05/29/2020 05/03/2020 03/22/2020 09/09/2018 01/20/2018  Decreased Interest 3 2 2 1 3   Down, Depressed, Hopeless 3 2 2  0 2  PHQ - 2 Score 6 4 4 1 5   Altered sleeping 3 2 2  - 3  Tired, decreased energy 3 2 2  - 3  Change in appetite 3 3 2  - 3  Feeling bad or failure about yourself  3 0 2 - 2  Trouble concentrating 3 3 2  - 3  Moving slowly or  fidgety/restless 3 3 2  - 2  Suicidal thoughts 0 0 0 - 0  PHQ-9 Score 24 17 16  - 21   GAD 7 : Generalized Anxiety Score 05/29/2020 05/03/2020 03/22/2020 01/20/2018  Nervous, Anxious, on Edge 3 0 3 3  Control/stop worrying 3 1 3 3   Worry too much - different things 3 1 3 3   Trouble relaxing 3 1 3 3   Restless 0 0 3 0  Easily annoyed or irritable 3 3 3 3   Afraid - awful might happen 1 1 3 3   Total GAD 7 Score 16 7 21  18

## 2020-06-14 ENCOUNTER — Ambulatory Visit (INDEPENDENT_AMBULATORY_CARE_PROVIDER_SITE_OTHER): Payer: No Typology Code available for payment source | Admitting: Clinical

## 2020-06-14 DIAGNOSIS — F39 Unspecified mood [affective] disorder: Secondary | ICD-10-CM

## 2020-06-14 DIAGNOSIS — F32A Depression, unspecified: Secondary | ICD-10-CM | POA: Diagnosis not present

## 2020-06-14 NOTE — Patient Instructions (Addendum)
[facility] [address] [place], [place] [address] [phone] (main office) [phone] (Emmanuelle Coxe's office)   

## 2020-06-17 NOTE — BH Specialist Note (Deleted)
Integrated Behavioral Health via Telemedicine Visit  06/11/2020 Zeidy Tayag [id number]  Number of Integrated Behavioral Health visits: *** Session Start time: 2:15***  Session End time: 2:45*** Total time: {IBH Total SHFW:26378588}  Referring Provider: Mariel Aloe, MD Patient/Family location: Home*** [facility] Provider location: [facility]  All persons participating in visit: Patient *** and [facility]arg ***  Types of Service: {CHL AMB TYPE OF [phone]}  I connected with [name] and/or [name] {family members:20773} by {CHL AMB IBH TELEMEDICINE MODES:[phone]} and verified that I am speaking with the correct person using two identifiers.    Discussed confidentiality: {YES/NO:21197}  I discussed the limitations of telemedicine and the availability of in person appointments.  Discussed there is a possibility of technology failure and discussed alternative modes of communication if that failure occurs.  I discussed that engaging in this telemedicine visit, they consent to the provision of behavioral healthcare and the services will be billed under their insurance.  Patient and/or legal guardian expressed understanding and consented to Telemedicine visit: {YES/NO:21197}  Presenting Concerns: Patient and/or family reports the following symptoms/concerns: *** Duration of problem: ***; Severity of problem: {Mild/Moderate/Severe:20260}  Patient and/or Family's Strengths/Protective Factors: {CHL AMB BH PROTECTIVE FACTORS:[phone]}  Goals Addressed: Patient will: 1.  Reduce symptoms of: {IBH Symptoms:21014056}  2.  Increase knowledge and/or ability of: {IBH Patient Tools:21014057}  3.  Demonstrate ability to: {IBH Goals:21014053}  Progress towards Goals: {CHL AMB BH PROGRESS TOWARDS GOALS:[phone]}  Interventions: Interventions utilized:  {IBH Interventions:21014054} Standardized  Assessments completed: {IBH Screening Tools:21014051}  Patient and/or Family Response: ***  Assessment: Patient currently experiencing ***.   Patient may benefit from ***.  Plan: 1. Follow up with behavioral health clinician on : *** 2. Behavioral recommendations: *** 3. Referral(s): {IBH Referrals:21014055}  I discussed the assessment and treatment plan with the patient and/or parent/guardian. They were provided an opportunity to ask questions and all were answered. They agreed with the plan and demonstrated an understanding of the instructions.   They were advised to call back or seek an in-person evaluation if the symptoms worsen or if the condition fails to improve as anticipated.  Valetta Close Jaymin Waln

## 2020-06-24 ENCOUNTER — Encounter: Payer: Self-pay | Admitting: *Deleted

## 2020-06-24 ENCOUNTER — Other Ambulatory Visit: Payer: Self-pay

## 2020-06-24 ENCOUNTER — Ambulatory Visit: Payer: No Typology Code available for payment source | Attending: Obstetrics and Gynecology

## 2020-06-24 ENCOUNTER — Ambulatory Visit: Payer: No Typology Code available for payment source | Admitting: *Deleted

## 2020-06-24 VITALS — BP 100/53 | HR 113

## 2020-06-24 DIAGNOSIS — Z362 Encounter for other antenatal screening follow-up: Secondary | ICD-10-CM | POA: Diagnosis not present

## 2020-06-24 DIAGNOSIS — Z8632 Personal history of gestational diabetes: Secondary | ICD-10-CM

## 2020-06-24 DIAGNOSIS — O99213 Obesity complicating pregnancy, third trimester: Secondary | ICD-10-CM

## 2020-06-24 DIAGNOSIS — Z98891 History of uterine scar from previous surgery: Secondary | ICD-10-CM | POA: Diagnosis not present

## 2020-06-24 DIAGNOSIS — O34219 Maternal care for unspecified type scar from previous cesarean delivery: Secondary | ICD-10-CM

## 2020-06-24 DIAGNOSIS — Z3A28 28 weeks gestation of pregnancy: Secondary | ICD-10-CM

## 2020-06-24 DIAGNOSIS — O09293 Supervision of pregnancy with other poor reproductive or obstetric history, third trimester: Secondary | ICD-10-CM | POA: Diagnosis not present

## 2020-06-25 ENCOUNTER — Other Ambulatory Visit: Payer: Self-pay | Admitting: *Deleted

## 2020-06-25 DIAGNOSIS — O09293 Supervision of pregnancy with other poor reproductive or obstetric history, third trimester: Secondary | ICD-10-CM

## 2020-06-25 DIAGNOSIS — O0990 Supervision of high risk pregnancy, unspecified, unspecified trimester: Secondary | ICD-10-CM

## 2020-06-25 DIAGNOSIS — Z8632 Personal history of gestational diabetes: Secondary | ICD-10-CM

## 2020-06-26 ENCOUNTER — Other Ambulatory Visit: Payer: No Typology Code available for payment source

## 2020-06-26 ENCOUNTER — Encounter: Payer: Self-pay | Admitting: Obstetrics & Gynecology

## 2020-06-26 ENCOUNTER — Ambulatory Visit (INDEPENDENT_AMBULATORY_CARE_PROVIDER_SITE_OTHER): Payer: No Typology Code available for payment source | Admitting: Clinical

## 2020-06-26 ENCOUNTER — Ambulatory Visit (INDEPENDENT_AMBULATORY_CARE_PROVIDER_SITE_OTHER): Payer: No Typology Code available for payment source | Admitting: Obstetrics & Gynecology

## 2020-06-26 ENCOUNTER — Other Ambulatory Visit: Payer: Self-pay

## 2020-06-26 VITALS — BP 117/58 | HR 105 | Wt 246.0 lb

## 2020-06-26 DIAGNOSIS — Z1331 Encounter for screening for depression: Secondary | ICD-10-CM

## 2020-06-26 DIAGNOSIS — O09299 Supervision of pregnancy with other poor reproductive or obstetric history, unspecified trimester: Secondary | ICD-10-CM

## 2020-06-26 DIAGNOSIS — O0990 Supervision of high risk pregnancy, unspecified, unspecified trimester: Secondary | ICD-10-CM

## 2020-06-26 DIAGNOSIS — F32A Depression, unspecified: Secondary | ICD-10-CM

## 2020-06-26 DIAGNOSIS — F39 Unspecified mood [affective] disorder: Secondary | ICD-10-CM

## 2020-06-26 DIAGNOSIS — O99343 Other mental disorders complicating pregnancy, third trimester: Secondary | ICD-10-CM

## 2020-06-26 DIAGNOSIS — O99210 Obesity complicating pregnancy, unspecified trimester: Secondary | ICD-10-CM

## 2020-06-26 DIAGNOSIS — Z658 Other specified problems related to psychosocial circumstances: Secondary | ICD-10-CM | POA: Diagnosis not present

## 2020-06-26 DIAGNOSIS — O0993 Supervision of high risk pregnancy, unspecified, third trimester: Secondary | ICD-10-CM

## 2020-06-26 DIAGNOSIS — Z8632 Personal history of gestational diabetes: Secondary | ICD-10-CM

## 2020-06-26 DIAGNOSIS — Z98891 History of uterine scar from previous surgery: Secondary | ICD-10-CM

## 2020-06-26 DIAGNOSIS — Z3A28 28 weeks gestation of pregnancy: Secondary | ICD-10-CM

## 2020-06-26 MED ORDER — COMFORT FIT MATERNITY SUPP LG MISC
0 refills | Status: DC
Start: 2020-06-26 — End: 2020-08-26

## 2020-06-26 NOTE — Patient Instructions (Addendum)
Center for [facility] at [facility] [place], [place] [address] [phone] (main office) [phone] [facility] office)  [email or web address]  Www.[email or web address].net     BRAINSTORMING  Develop a Plan Goals: . Provide a way to start conversation about your new life with a baby . Assist parents in recognizing and using resources within their reach . Help pave the way before birth for an easier period of transition afterwards.  Make a list of the following information to keep in a central location: . Full name of Mom and Partner: _____________________________________________ . 33 full name and Date of Birth: ___________________________________________ . Home Address: ___________________________________________________________ ________________________________________________________________________ . Home Phone: ____________________________________________________________ . Parents' cell numbers: _____________________________________________________ ________________________________________________________________________ . Name and contact info for OB: ______________________________________________ . Name and contact info for Pediatrician:________________________________________ . Contact info for Lactation Consultants: ________________________________________  REST and SLEEP *You each need at least 4-5 hours of uninterrupted sleep every day. Write specific names and contact information.* . How are you going to rest in the postpartum period? While partner's home? When partner returns to work? When you both return to work? Marland Kitchen Where will your baby sleep? Marland Kitchen Who is available to help during the day? Evening? Night? . Who could move in for a period to help support you? Marland Kitchen What are some ideas to help you get enough  sleep? __________________________________________________________________________________________________________________________________________________________________________________________________________________________________________ NUTRITIOUS FOOD AND DRINK *Plan for meals before your baby is born so you can have healthy food to eat during the immediate postpartum period.* . Who will look after breakfast? Lunch? Dinner? List names and contact information. Brainstorm quick, healthy ideas for each meal. . What can you do before baby is born to prepare meals for the postpartum period? . How can others help you with meals? Marland Kitchen Which grocery stores provide online shopping and delivery? Marland Kitchen Which restaurants offer take-out or delivery options? ______________________________________________________________________________________________________________________________________________________________________________________________________________________________________________________________________________________________________________________________________________________________________________________________________  CARE FOR MOM *It's important that mom is cared for and pampered in the postpartum period. Remember, the most important ways new mothers need care are: sleep, nutrition, gentle exercise, and time off.* . Who can come take care of mom during this period? Make a list of people with their contact information. . List some activities that make you feel cared for, rested, and energized? Who can make sure you have opportunities to do these things? . Does mom have a space of her very own within your home that's just for her? Make a "[facility]" where she can be comfortable, rest, and renew herself  daily. ______________________________________________________________________________________________________________________________________________________________________________________________________________________________________________________________________________________________________________________________________________________________________________________________________    CARE FOR AND FEEDING BABY *Knowledgeable and encouraging people will offer the best support with regard to feeding your baby.* . Educate yourself and choose the best feeding option for your baby. . Make a list of people who will guide, support, and be a resource for you as your care for and feed your baby. (Friends that have breastfed or are currently breastfeeding, lactation consultants, breastfeeding support groups, etc.) . Consider a postpartum doula. (These websites can give you information: dona.org & BuyingShow.es) . Seek out local breastfeeding resources like the breastfeeding support group at [employer] or [employer]. ______________________________________________________________________________________________________________________________________________________________________________________________________________________________________________________________________________________________________________________________________________________________________________________________________  Verner Chol AND ERRANDS . Who can help with a thorough cleaning before baby is born? . Make a list of people who will help with housekeeping and chores, like laundry, light cleaning, dishes, bathrooms, etc. . Who can run some errands for you? Marland Kitchen What can you do to make sure you are stocked with basic supplies before baby is born? . Who is  going to do the  shopping? ______________________________________________________________________________________________________________________________________________________________________________________________________________________________________________________________________________________________________________________________________________________________________________________________________     Family Adjustment *Nurture yourselves.it helps parents be more loving and allows for better bonding with their child.* . What sorts of things do you and partner enjoy doing together? Which activities help you to connect and strengthen your relationship? Make a list of those things. Make a list of people whom you trust to care for your baby so you can have some time together as a couple. . What types of things help partner feel connected to Mom? Make a list. . What needs will partner have in order to bond with baby? . Other children? Who will care for them when you go into labor and while you are in the hospital? . Think about what the needs of your older children might be. Who can help you meet those needs? In what ways are you helping them prepare for bringing baby home? List some specific strategies you have for family adjustment. _______________________________________________________________________________________________________________________________________________________________________________________________________________________________________________________________________________________________________________________________________________  SUPPORT *Someone who can empathize with experiences normalizes your problems and makes them more bearable.* . Make a list of other friends, neighbors, and/or co-workers you know with infants (and small children, if applicable) with whom you can connect. . Make a list of local or online support groups, mom groups, etc. in which you can be  involved. ______________________________________________________________________________________________________________________________________________________________________________________________________________________________________________________________________________________________________________________________________________________________________________________________________  Childcare Plans . Investigate and plan for childcare if mom is returning to work. . Talk about mom's concerns about her transition back to work. . Talk about partner's concerns regarding this transition.  Mental Health *Your mental health is one of the highest priorities for a pregnant or postpartum mom.* . 1 in 5 women experience anxiety and/or depression from the time of conception through the first year after birth. . Postpartum Mood Disorders are the #1 complication of pregnancy and childbirth and the suffering experienced by these mothers is not necessary! These illnesses are temporary and respond well to treatment, which often includes self-care, social support, talk therapy, and medication when needed. . Women experiencing anxiety and depression often say things like: "I'm supposed to be happy.why do I feel so sad?", "Why can't I snap out of it?", "I'm having thoughts that scare me." . There is no need to be embarrassed if you are feeling these symptoms: o Overwhelmed, anxious, angry, sad, guilty, irritable, hopeless, exhausted but can't sleep o You are NOT alone. You are NOT to blame. With help, you WILL be well. . Where can I find help? Medical professionals such as your OB, midwife, gynecologist, family practitioner, primary care provider, pediatrician, or mental health providers; [facility] support groups: Feelings After Birth, Breastfeeding Support Group, Baby and Me Group, and Fit 4 Two exercise classes. . You have permission to ask for help. It will confirm your feelings, validate your  experiences, share/learn coping strategies, and gain support and encouragement as you heal. You are important! BRAINSTORM . Make a list of local resources, including resources for mom and for partner. . Identify support groups. . Identify people to call late at night - include names and contact info. . Talk with partner about perinatal mood and anxiety disorders. . Talk with your OB, midwife, and doula about baby blues and about perinatal mood and anxiety disorders. . Talk with your pediatrician about perinatal mood and anxiety disorders.   Support & Sanity Savers   What do you really need?  . Basics . In preparing for a new baby, many expectant parents spend hours shopping for baby clothes, decorating the nursery, and   deciding which car seat to buy. Yet most don't think much about what the reality of parenting a newborn will be like, and what they need to make it through that. So, here is the advice of experienced parents. We know you'll read this, and think "they're exaggerating, I don't really need that." Just trust us on these, OK? Plan for all of this, and if it turns out you don't need it, come back and teach us how you did it!  . Must-Haves (Once baby's survival needs are met, make sure you attend to your own survival needs!) . Sleep . An average newborn sleeps 16-18 hours per day, over 6-7 sleep periods, rarely more than three hours at a time. It is normal and healthy for a newborn to wake throughout the night... but really hard on parents!! . Naps. Prioritize sleep above any responsibilities like: cleaning house, visiting friends, running errands, etc.  Sleep whenever baby sleeps. If you can't nap, at least have restful times when baby eats. The more rest you get, the more patient you will be, the more emotionally stable, and better at solving problems.  . Food . You may not have realized it would be difficult to eat when you have a newborn. Yet, when we talk to . countless new  parents, they say things like "it may be 2:00 pm when I realize I haven't had breakfast yet." Or "every time we sit down to dinner, baby needs to eat, and my food gets cold, so I don't bother to eat it." . Finger food. Before your baby is born, stock up with one months' worth of food that: 1) you can eat with one hand while holding a baby, 2) doesn't need to be prepped, 3) is good hot or cold, 4) doesn't spoil when left out for a few hours, and 5) you like to eat. Think about: nuts, dried fruit, Clif bars, pretzels, jerky, gogurt, baby carrots, apples, bananas, crackers, cheez-n-crackers, string cheese, hot pockets or frozen burritos to microwave, garden burgers and breakfast pastries to put in the toaster, yogurt drinks, etc. . Restaurant Menus. Make lists of your favorite restaurants & menu items. When family/friends want to help, you can give specific information without much thought. They can either bring you the food or send gift cards for just the right meals. . Freezer Meals.  Take some time to make a few meals to put in the freezer ahead of time.  Easy to freeze meals can be anything such as soup, lasagna, chicken pie, or spaghetti sauce. . Set up a Meal Schedule.  Ask friends and family to sign up to bring you meals during the first few weeks of being home. (It can be passed around at baby showers!) You have no idea how helpful this will be until you are in the throes of parenting.  www.takethemameal.com is a great website to check out. . Emotional Support . Know who to call when you're stressed out. Parenting a newborn is very challenging work. There are times when it totally overwhelms your normal coping abilities. EVERY NEW PARENT NEEDS TO HAVE A PLAN FOR WHO TO CALL WHEN THEY JUST CAN'T COPE ANY MORE. (And it has to be someone other than the baby's other parent!) Before your baby is born, come up with at least one person you can call for support - write their phone number down and post it on the  refrigerator. . Anxiety & Sadness. Baby blues are normal after pregnancy; however, there are   more severe types of anxiety & sadness which can occur and should not be ignored.  They are always treatable, but you have to take the first step by reaching out for help. [facility] offers a "Mom Talk" group which meets every Tuesday from 10 am - 11 am.  This group is for new moms who need support and connection after their babies are born.  Call [phone].  . Really, Really Helpful (Plan for them! Make sure these happen often!!) . Physical Support with Taking Care of Yourselves . Asking friends and family. Before your baby is born, set up a schedule of people who can come and visit and help out (or ask a friend to schedule for you). Any time someone says "let me know what I can do to help," sign them up for a day. When they get there, their job is not to take care of the baby (that's your job and your joy). Their job is to take care of you!  . Postpartum doulas. If you don't have anyone you can call on for support, look into postpartum doulas:  professionals at helping parents with caring for baby, caring for themselves, getting breastfeeding started, and helping with household tasks. www.padanc.org is a helpful website for learning about doulas in our area. . Peer Support / Parent Groups . Why: One of the greatest ideas for new parents is to be around other new parents. Parent groups give you a chance to share and listen to others who are going through the same season of life, get a sense of what is normal infant development by watching several babies learn and grow, share your stories of triumph and struggles with empathetic ears, and forgive your own mistakes when you realize all parents are learning by trial and error. . Where to find: There are many places you can meet other new parents throughout our community.  [facility] offers the following classes for new moms and their little ones:  Baby  and Me (Birth to Crawling) and Breastfeeding Support Group. Go to www.conehealthybaby.com or call [phone] for more information. . Time for your Relationship . It's easy to get so caught up in meeting baby's immediate needs that it's hard to find time to connect with your partner, and meet the needs of your relationship. It's also easy to forget what "quality time with your partner" actually looks like. If you take your baby on a date, you'd be amazed how much of your couple time is spent feeding the baby, diapering the baby, admiring the baby, and talking about the baby. . Dating: Try to take time for just the two of you. Babysitter tip: Sometimes when moms are breastfeeding a newborn, they find it hard to figure out how to schedule outings around baby's unpredictable feeding schedules. Have the babysitter come for a three hour period. When she comes over, if baby has just eaten, you can leave right away, and come back in two hours. If baby hasn't fed recently, you start the date at home. Once baby gets hungry and gets a good feeding in, you can head out for the rest of your date time. . Date Nights at Home: If you can't get out, at least set aside one evening a week to prioritize your relationship: whenever baby dozes off or doesn't have any immediate needs, spend a little time focusing on each other. . Potential conflicts: The main relationship conflicts that come up for new parents are: issues related to sexuality, financial stresses,   a feeling of an unfair division of household tasks, and conflicts in parenting styles. The more you can work on these issues before baby arrives, the better!  . Fun and Frills (Don't forget these. and don't feel guilty for indulging in them!) . Everyone has something in life that is a fun little treat that they do just for themselves. It may be: reading the morning paper, or going for a daily jog, or having coffee with a friend once a week, or going to a movie on Friday  nights, or fine chocolates, or bubble baths, or curling up with a good book. . Unless you do fun things for yourself every now and then, it's hard to have the energy for fun with your baby. Whatever your "special" treats are, make sure you find a way to continue to indulge in them after your baby is born. These special moments can recharge you, and allow you to return to baby with a new joy   PERINATAL MOOD DISORDERS: MATERNAL MENTAL HEALTH FROM CONCEPTION THROUGH THE POSTPARTUM PERIOD   Emergency and Crisis Resources:  If you are an imminent risk to self or others, are experiencing intense personal distress, and/or have noticed significant changes in activities of daily living, call:  . 911 . [facility]: [phone] . Mobile Crisis: 877-626-1772 . National Suicide Hotline: 1-800-273-8255 Or visit the following crisis centers: . Local Emergency Departments . [facility]: [address], [place] [phone]. Hours: 8:30AM-5PM. Insurance Accepted: Medicaid, Medicare, and Uninsured.  . [facility]  [address], [place] Mon-Friday 8am-3pm  [phone]                                                                                    Non-Crisis Resources: To identify specific providers that are covered by your insurance, contact your insurance company or local agencies: [place]: 1-800-256-2452 [facility]: 888-581-9988 [employer]: 1-800-939-5911 Postpartum Support International- [place] 1-800-944-4773                                                      Outpatient therapy and medication management providers:  [facility] [phone] Hours: 9AM-5PM  Insurance Accepted: AARP, Aetna, BCBS, Cigna, [employer], Humana, Medicare  [facility]) [phone] Hours: 8AM-5PM  nsurance Accepted: All insurances EXCEPT AARP, Aetna, [employer], and  [employer] [facility] [place]: [phone]             Hours: 8AM-8PM Insurance Accepted: Aetna, BCBS, Cigna, [employer], Medicaid, Medicare, Uninsured [facility]: [phone] [facility]: [phone] Hours: 8:30AM-7PM Insurance Accepted: Aetna, BCBS, Medicaid, Medicare, Tricare, United Healthcare [facility]:  [phone]              Hours:9AM-5PM Insurance Accepted:  Aetna, BCBS, [facility], Medicaid, United Health Care  [facility] [phone] Hours: 9AM-5:30PM Insurance Accepted: AARP, Aetna, BCBS, Cigna, and Medicaid, Medicare, United Health Care [facility]:  [phone] Hours: 9am-5pm Insurance Accepted:   BCBS; they do not accept Medicaid/Medicare [facility]: [phone] Hours: 9am-9pm Insurance Accepted: All major insurance including Medicaid and Medicare [facility]: [phone] Hours: 9AM-5:30PM Insurance Accepted: All insurances EXCEPT Medicaid and Medicare. [facility]: [place]: [phone] [place]:  [place] (support for children in the NICU and/or with special needs), [place] [employer]: [phone]                                                                                     Online Resources: Postpartum Support International: http://jones-berg.com/  800-944-4PPD 2Moms Supporting Moms:  www.momssupportingmoms.net   /Emotional [employer] and Websites Here are a few free apps meant to help you to help yourself.  To find, try searching on the internet to see if the app is offered on Apple/Android devices. If your first choice doesn't come up  on your device, the good news is that there are many choices! Play around with different apps to see which ones are helpful to you.    Calm This is an app meant to help increase calm feelings. Includes info, strategies, and tools for tracking your feelings.      Calm Harm  This app is meant to help with self-harm. Provides many 5-minute or 15-min coping strategies for doing instead of hurting yourself.       Monona is a problem-solving tool to help deal with emotions and cope with stress you encounter wherever you are.      MindShift This app can help people cope with anxiety. Rather than trying to avoid anxiety, you can make an important shift and face it.      MY3  MY3 features a support system, safety plan and resources with the goal of offering a tool to use in a time of need.       My Life My Voice  This mood journal offers a simple solution for tracking your thoughts, feelings and moods. Animated emoticons can help identify your mood.       Relax Melodies Designed to help with sleep, on this app you can mix sounds and meditations for relaxation.      Smiling Mind Smiling Mind is meditation made easy: it's a simple tool that helps put a smile on your mind.  Stop, Breathe & Think  A friendly, simple guide for people through meditations for mindfulness and compassion.  Stop, Breathe and Think Kids Enter your current feelings and choose a "mission" to help you cope. Offers videos for certain moods instead of just sound recordings.       Team Orange The goal of this tool is to help teens change how they think, act, and react. This app helps you focus on your own good feelings and experiences.      The Ash[place] Box The [place] Box (VHB) contains simple tools to help patients with coping, relaxation, distraction, and positive thinking.     Behavioral Health Resources:   What if I or someone I know is in  crisis?  . If you are thinking about harming yourself or having thoughts of suicide, or if you know someone who is, seek help right away.  . Call your doctor or mental health care provider.  . Call 911 or go to a hospital emergency room to get immediate help, or ask a friend or family member to help you do these things; IF YOU ARE IN [place], YOU MAY GO TO WALK-IN URGENT CARE 24/7 at [facility] (see below)  . Call the [place] National Suicide Prevention Lifeline's toll-free, 24-hour hotline at 1-800-273-TALK (726)821-2385) or TTY: 1-800-799-4 TTY 423-062-6550) to talk to a trained counselor.  . If you are in crisis, make sure you are not left alone.   . If someone else is in crisis, make sure he or she is not left alone   24 Hour :   [facility]  [address], [place], [place] [address] [phone] or [place] 24/7  [facility] Mobile Crisis: 402-114-2092  [place] National Suicide Hotline: 820 509 7694  [facility] [employer] (Domestic Violence, Rape & Victim Assistance)  (312) 059-0979  [place]  [address], [place]  [address]   [phone] or [phone]   [place]: [phone] (8am-4pm) or [phone] (after hours)        [facility], [address] [place], [place] Fax: [phone] [email or web address] *Interpreters available *Accepts all insurance and uninsured for Urgent Care needs *Accepts Medicaid and uninsured for outpatient treatment   [facility]   Mon-Fri: 8am-5pm [place], [place], [place]); [phone]) [email or web address]  *Accepts Medicare  [facility] [place] Mason, Fri: 8am-4pm [place],  [place], [place] [address] [phone] (phone); [phone] (fax) [email or web address]  *[place] Mon-Fri: 9am-5pm  [address] [place], [place] (phone); [phone]  [email or web address]  *Accepts Medicaid  Jinny Blossom [facility] [address] Johnette Abraham [place], [place] [email or web address].[email or web address]   [facility] [place], 8:30am-12pm/1pm-2:30pm [address], [place], [place] (phone); [phone] (fax) [email or web address]  *Accepts Medicaid, sliding-scale*Bilingual services available  [facility] Mon-Fri, 8am-7pm [place], [place]  [phone](phone); [phone]) www.[email or web address]  *Accepts Medicaid *Bilingual services available  [facility] Mon-Fri: 8am-5pm, Saturday by appointment only [place], [place], [place] (phone); [phone] (fax) [email or web address]   [facility] [address] [place], [place], [place] [email or web address]  *Free & reduced services for uninsured and underinsured individuals *Bilingual services for Spanish-speaking clients 21 and under  [facility], [address], [place],  [place] [phone](KPTWS); [phone]) [email or web address]  *Bring your own interpreter at first visit *Accepts Medicare and Medicaid  [place] Mon-Fri: 9am-5:30pm [address] [place], [place], [place] (phone), [phone] (fax) After hours crisis line: [phone] [email or web address]  *Accepts Medicare and Medicaid  [facility], 8am-6pm [address] [place], [place] (phone); [phone] (fax) http://[email or web address]  *Subsidized costs  available  [facility] Mon-Fri: 8am-4pm [address], [place], [place] [phone](phone); [phone]) [email or web address].[email or web address]  *Accepts Medicaid  [facility] Same day access hours: Mon-Fri, 8:30-3pm Crisis hours: Mon-Fri, 8am-5pm [address] [place], [place] (336) [place] Same day access hours: Mon-Fri, 8:30-3pm Crisis hours: Mon-Fri, 8am-8pm [address] [place], [place] (phone); [phone] (fax) www.[email or web address]  *Accepts Medicaid and Medicare  [facility] [place] Mon, [place], Fri: 9am-9pm Tues, Thurs: 9am-6pm [place], [place], [place] (phone); [phone] (fax) [email or web address]  *(Accepts Medicare and Medicaid; payment plans available)*Bilingual services available  [facility] [address], [place], [place] (phone); [phone] (fax) [email or web address]   [facility] [address] [place], [place], [place]  [email or web address]  *Bilingual services available  [facility] Mon-Thurs: 8am-8pm [address], [place], [place], [place] (phone); [phone] (fax) [email or web address]  *Accepts Medicaid*Bilingual services available  [facility] [place]. [place], [place] (phone) [email or web address]  *Accepts Medicaid *Bilingual services available  [facility] Mon-Fri, 9am-4:45pm [address], [place], [place] (phone); [phone] (fax) [email or web address]  *[facility] Mon-Thurs: 8:30-8pm, Fri: 8:30am-7pm [address] [place], [place] (3rd floor) [phone] (phone); [phone] (fax) [email or web address]  *Accepts Medicaid; income-based reduced rates  available  [facility] Mon-Fri: 8am-5pm [address], [place], [place] [address] [phone] (phone); [phone] (fax) [email or web address]  *Accepts Medicaid*Bilingual services available   [facility])  [address], [place] [phone] [email or web address]  *Provides direct services to individuals in recovery from mental illness, including support groups, recovery skills classes, and one on one peer support  [facility] [employer] [facility] [place]) [place] helpline: [phone]  [facility]MI [place] helpline: [phone] https://[email or web address]  *A community hub for information relating to local resources and services for the friends and families of individuals living alongside a mental health condition, as well as the individuals themselves. Classes and support groups also provided

## 2020-06-26 NOTE — Patient Instructions (Addendum)
PREGNANCY SUPPORT BELT: You are not alone, Seventy-five percent of women have some sort of abdominal or back pain at some point in their pregnancy. Your baby is growing at a fast pace, which means that your whole body is rapidly trying to adjust to the changes. As your uterus grows, your back may start feeling a bit under stress and this can result in back or abdominal pain that can go from mild, and therefore bearable, to severe pains that will not allow you to sit or lay down comfortably, When it comes to dealing with pregnancy-related pains and cramps, some pregnant women usually prefer natural remedies, which the market is filled with nowadays. For example, wearing a pregnancy support belt can help ease and lessen your discomfort and pain. WHAT ARE THE BENEFITS OF WEARING A PREGNANCY SUPPORT BELT? A pregnancy support belt provides support to the lower portion of the belly taking some of the weight of the growing uterus and distributing to the other parts of your body. It is designed make you comfortable and gives you extra support. Over the years, the pregnancy apparel market has been studying the needs and wants of pregnant women and they have come up with the most comfortable pregnancy support belts that woman could ever ask for. In fact, you will no longer have to wear a stretched-out or bulky pregnancy belt that is visible underneath your clothes and makes you feel even more uncomfortable. Nowadays, a pregnancy support belt is made of comfortable and stretchy materials that will not irritate your skin but will actually make you feel at ease and you will not even notice you are wearing it. They are easy to put on and adjust during the day and can be worn at night for additional support.  BENEFITS: . Relives Back pain . Relieves Abdominal Muscle and Leg Pain . Stabilizes the Pelvic Ring . Offers a Cushioned Abdominal Lift Pad . Relieves pressure on the Sciatic Nerve Within Minutes WHERE TO GET  YOUR PREGNANCY BELT: [employer] [phone]  [address] [place], [place] [address]     Return to office for any scheduled appointments. Call the office or go to the MAU at [facility] at [facility] if:  You begin to have strong, frequent contractions  Your water breaks.  Sometimes it is a big gush of fluid, sometimes it is just a trickle that keeps getting your panties wet or running down your legs  You have vaginal bleeding.  It is normal to have a small amount of spotting if your cervix was checked.   You do not feel your baby moving like normal.  If you do not, get something to eat and drink and lay down and focus on feeling your baby move.   If your baby is still not moving like normal, you should call the office or go to MAU.  Any other obstetric concerns.   Third Trimester of Pregnancy The third trimester is from week 28 through week 40 (months 7 through 9). The third trimester is a time when the unborn baby (fetus) is growing rapidly. At the end of the ninth month, the fetus is about 20 inches in length and weighs 6-10 pounds. Body changes during your third trimester Your body will continue to go through many changes during pregnancy. The changes vary from woman to woman. During the third trimester:  Your weight will continue to increase. You can expect to gain 25-35 pounds (11-16 kg) by the  end of the pregnancy.  You may begin to get stretch marks on your hips, abdomen, and breasts.  You may urinate more often because the fetus is moving lower into your pelvis and pressing on your bladder.  You may develop or continue to have heartburn. This is caused by increased hormones that slow down muscles in the digestive tract.  You may develop or continue to have constipation because increased hormones slow digestion and cause the muscles that push waste through your intestines to relax.  You may develop hemorrhoids. These are swollen veins  (varicose veins) in the rectum that can itch or be painful.  You may develop swollen, bulging veins (varicose veins) in your legs.  You may have increased body aches in the pelvis, back, or thighs. This is due to weight gain and increased hormones that are relaxing your joints.  You may have changes in your hair. These can include thickening of your hair, rapid growth, and changes in texture. Some women also have hair loss during or after pregnancy, or hair that feels dry or thin. Your hair will most likely return to normal after your baby is born.  Your breasts will continue to grow and they will continue to become tender. A yellow fluid (colostrum) may leak from your breasts. This is the first milk you are producing for your baby.  Your belly button may stick out.  You may notice more swelling in your hands, face, or ankles.  You may have increased tingling or numbness in your hands, arms, and legs. The skin on your belly may also feel numb.  You may feel short of breath because of your expanding uterus.  You may have more problems sleeping. This can be caused by the size of your belly, increased need to urinate, and an increase in your body's metabolism.  You may notice the fetus "dropping," or moving lower in your abdomen (lightening).  You may have increased vaginal discharge.  You may notice your joints feel loose and you may have pain around your pelvic bone. What to expect at prenatal visits You will have prenatal exams every 2 weeks until week 36. Then you will have weekly prenatal exams. During a routine prenatal visit:  You will be weighed to make sure you and the baby are growing normally.  Your blood pressure will be taken.  Your abdomen will be measured to track your baby's growth.  The fetal heartbeat will be listened to.  Any test results from the previous visit will be discussed.  You may have a cervical check near your due date to see if your cervix has  softened or thinned (effaced).  You will be tested for Group B streptococcus. This happens between 35 and 37 weeks. Your health care provider may ask you:  What your birth plan is.  How you are feeling.  If you are feeling the baby move.  If you have had any abnormal symptoms, such as leaking fluid, bleeding, severe headaches, or abdominal cramping.  If you are using any tobacco products, including cigarettes, chewing tobacco, and electronic cigarettes.  If you have any questions. Other tests or screenings that may be performed during your third trimester include:  Blood tests that check for low iron levels (anemia).  Fetal testing to check the health, activity level, and growth of the fetus. Testing is done if you have certain medical conditions or if there are problems during the pregnancy.  Nonstress test (NST). This test checks the health of  your baby to make sure there are no signs of problems, such as the baby not getting enough oxygen. During this test, a belt is placed around your belly. The baby is made to move, and its heart rate is monitored during movement. What is false labor? False labor is a condition in which you feel small, irregular tightenings of the muscles in the womb (contractions) that usually go away with rest, changing position, or drinking water. These are called Braxton Hicks contractions. Contractions may last for hours, days, or even weeks before true labor sets in. If contractions come at regular intervals, become more frequent, increase in intensity, or become painful, you should see your health care provider. What are the signs of labor?  Abdominal cramps.  Regular contractions that start at 10 minutes apart and become stronger and more frequent with time.  Contractions that start on the top of the uterus and spread down to the lower abdomen and back.  Increased pelvic pressure and dull back pain.  A watery or bloody mucus discharge that comes from  the vagina.  Leaking of amniotic fluid. This is also known as your "water breaking." It could be a slow trickle or a gush. Let your health care provider know if it has a color or strange odor. If you have any of these signs, call your health care provider right away, even if it is before your due date. Follow these instructions at home: Medicines  Follow your health care provider's instructions regarding medicine use. Specific medicines may be either safe or unsafe to take during pregnancy.  Take a prenatal vitamin that contains at least 600 micrograms (mcg) of folic acid.  If you develop constipation, try taking a stool softener if your health care provider approves. Eating and drinking   Eat a balanced diet that includes fresh fruits and vegetables, whole grains, good sources of protein such as meat, eggs, or tofu, and low-fat dairy. Your health care provider will help you determine the amount of weight gain that is right for you.  Avoid raw meat and uncooked cheese. These carry germs that can cause birth defects in the baby.  If you have low calcium intake from food, talk to your health care provider about whether you should take a daily calcium supplement.  Eat four or five small meals rather than three large meals a day.  Limit foods that are high in fat and processed sugars, such as fried and sweet foods.  To prevent constipation: ? Drink enough fluid to keep your urine clear or pale yellow. ? Eat foods that are high in fiber, such as fresh fruits and vegetables, whole grains, and beans. Activity  Exercise only as directed by your health care provider. Most women can continue their usual exercise routine during pregnancy. Try to exercise for 30 minutes at least 5 days a week. Stop exercising if you experience uterine contractions.  Avoid heavy lifting.  Do not exercise in extreme heat or humidity, or at high altitudes.  Wear low-heel, comfortable shoes.  Practice good  posture.  You may continue to have sex unless your health care provider tells you otherwise. Relieving pain and discomfort  Take frequent breaks and rest with your legs elevated if you have leg cramps or low back pain.  Take warm sitz baths to soothe any pain or discomfort caused by hemorrhoids. Use hemorrhoid cream if your health care provider approves.  Wear a good support bra to prevent discomfort from breast tenderness.  If you  develop varicose veins: ? Wear support pantyhose or compression stockings as told by your healthcare provider. ? Elevate your feet for 15 minutes, 3-4 times a day. Prenatal care  Write down your questions. Take them to your prenatal visits.  Keep all your prenatal visits as told by your health care provider. This is important. Safety  Wear your seat belt at all times when driving.  Make a list of emergency phone numbers, including numbers for family, friends, the hospital, and police and fire departments. General instructions  Avoid cat litter boxes and soil used by cats. These carry germs that can cause birth defects in the baby. If you have a cat, ask someone to clean the litter box for you.  Do not travel far distances unless it is absolutely necessary and only with the approval of your health care provider.  Do not use hot tubs, steam rooms, or saunas.  Do not drink alcohol.  Do not use any products that contain nicotine or tobacco, such as cigarettes and e-cigarettes. If you need help quitting, ask your health care provider.  Do not use any medicinal herbs or unprescribed drugs. These chemicals affect the formation and growth of the baby.  Do not douche or use tampons or scented sanitary pads.  Do not cross your legs for long periods of time.  To prepare for the arrival of your baby: ? Take prenatal classes to understand, practice, and ask questions about labor and delivery. ? Make a trial run to the hospital. ? Visit the hospital and tour  the maternity area. ? Arrange for maternity or paternity leave through employers. ? Arrange for family and friends to take care of pets while you are in the hospital. ? Purchase a rear-facing car seat and make sure you know how to install it in your car. ? Pack your hospital bag. ? Prepare the baby's nursery. Make sure to remove all pillows and stuffed animals from the baby's crib to prevent suffocation.  Visit your dentist if you have not gone during your pregnancy. Use a soft toothbrush to brush your teeth and be gentle when you floss. Contact a health care provider if:  You are unsure if you are in labor or if your water has broken.  You become dizzy.  You have mild pelvic cramps, pelvic pressure, or nagging pain in your abdominal area.  You have lower back pain.  You have persistent nausea, vomiting, or diarrhea.  You have an unusual or bad smelling vaginal discharge.  You have pain when you urinate. Get help right away if:  Your water breaks before 37 weeks.  You have regular contractions less than 5 minutes apart before 37 weeks.  You have a fever.  You are leaking fluid from your vagina.  You have spotting or bleeding from your vagina.  You have severe abdominal pain or cramping.  You have rapid weight loss or weight gain.  You have shortness of breath with chest pain.  You notice sudden or extreme swelling of your face, hands, ankles, feet, or legs.  Your baby makes fewer than 10 movements in 2 hours.  You have severe headaches that do not go away when you take medicine.  You have vision changes. Summary  The third trimester is from week 28 through week 40, months 7 through 9. The third trimester is a time when the unborn baby (fetus) is growing rapidly.  During the third trimester, your discomfort may increase as you and your baby continue to  gain weight. You may have abdominal, leg, and back pain, sleeping problems, and an increased need to  urinate.  During the third trimester your breasts will keep growing and they will continue to become tender. A yellow fluid (colostrum) may leak from your breasts. This is the first milk you are producing for your baby.  False labor is a condition in which you feel small, irregular tightenings of the muscles in the womb (contractions) that eventually go away. These are called Braxton Hicks contractions. Contractions may last for hours, days, or even weeks before true labor sets in.  Signs of labor can include: abdominal cramps; regular contractions that start at 10 minutes apart and become stronger and more frequent with time; watery or bloody mucus discharge that comes from the vagina; increased pelvic pressure and dull back pain; and leaking of amniotic fluid. This information is not intended to replace advice given to you by your health care provider. Make sure you discuss any questions you have with your health care provider. Document Revised: 10/20/2018 Document Reviewed: 08/04/2016 Elsevier Patient Education  2020 [employer].

## 2020-06-26 NOTE — Addendum Note (Signed)
Addended by: Jaynie Collins A on: 06/26/2020 09:54 AM   Modules accepted: Orders

## 2020-06-26 NOTE — BH Specialist Note (Addendum)
Integrated Behavioral Health Follow Up In-Person Visit  MRN: [id number] Name: Colleen Fields  Number of Integrated Behavioral Health Clinician visits: 2/6 Session Start time: 10:05  Session End time: 10:34 Total time: 29 minutes  Types of Service: Individual psychotherapy  Interpretor:No. Interpretor Name and Language: n/a  Subjective: Colleen Fields is a 29 y.o. female accompanied by n/a Patient was referred by Jaynie Collins, MD for positive depression screen. Patient reports the following symptoms/concerns: Pt denies current SI, no plan and no intent; pt says the last time she had SI was 5 years prior; pt attributes escalation of depressive and anxious symptoms to physical pain that comes and goes depending on baby's positioning, along with interpersonal conflict with husband that raises her stress level.  Duration of problem: Ongoing with increase in current pregnancy; Severity of problem: severe  Objective: Mood: normal and Affect: Appropriate Risk of harm to self or others: No plan to harm self or others  Life Context: Family and Social: Pt lives with her husband and children (10yo daughter; 2yo son) School/Work: Pt works full time from home Life Changes: Current pregnancy; conflict with husband  Patient and/or Family's Strengths/Protective Factors: Social connections and Concrete supports in place (healthy food, safe environments, etc.)  Goals Addressed: Patient will: 1.  Reduce symptoms of: anxiety, depression and stress  2.  Increase knowledge and/or ability of: healthy habits  3.  Demonstrate ability to: Increase healthy adjustment to current life circumstances and Increase motivation to adhere to plan of care  Progress towards Goals: Ongoing  Interventions: Interventions utilized:  Solution-Focused Strategies and Psychoeducation and/or Health Education Standardized Assessments completed: C-SSRS Short, GAD-7 and PHQ 9  Patient and/or Family Response: Pt  agrees to revised treatment plan  Patient Centered Plan: Patient is on the following Treatment Plan(s): IBH  Assessment: Patient currently experiencing Mood disorder, unspecified.   Patient may benefit from continued psychoeducation and brief therapeutic interventions regarding coping with symptoms of depression, anxiety, and life stress .  Plan: 1. Follow up with behavioral health clinician on : Three weeks 2. Behavioral recommendations:  -Follow Safety Plan, as discussed, if SI returns -Continue taking prenatal vitamin and iron as prescribed -Continue with plan to pick up maternity belt this week -Continue with plan to obtain pregnancy pillow from family; begin using asap to help improve sleep quality 3. Referral(s): Integrated [employer] [facility] (In Clinic)  [address] [place], [place]  Depression screen [facility] 2/9 06/26/2020 05/29/2020 05/03/2020 03/22/2020 09/09/2018  Decreased Interest 2 3 2 2 1   Down, Depressed, Hopeless 2 3 2 2  0  PHQ - 2 Score 4 6 4 4 1   Altered sleeping 3 3 2 2  -  Tired, decreased energy 3 3 2 2  -  Change in appetite 2 3 3 2  -  Feeling bad or failure about yourself  2 3 0 2 -  Trouble concentrating 2 3 3 2  -  Moving slowly or fidgety/restless 2 3 3 2  -  Suicidal thoughts 2 0 0 0 -  PHQ-9 Score 20 24 17 16  -   GAD 7 : Generalized Anxiety Score 06/26/2020 05/29/2020 05/03/2020 03/22/2020  Nervous, Anxious, on Edge 3 3 0 3  Control/stop worrying 2 3 1 3   Worry too much - different things 2 3 1 3   Trouble relaxing 2 3 1 3   Restless 1 0 0 3  Easily annoyed or irritable 3 3 3 3   Afraid - awful might happen 2 1 1 3   Total GAD 7 Score 15  16 7 21      

## 2020-06-26 NOTE — Progress Notes (Signed)
   PRENATAL VISIT NOTE  Subjective:  Colleen Fields is a 29 y.o. G3P2002 at [redacted]w[redacted]d being seen today for ongoing prenatal care.  She is currently monitored for the following issues for this high-risk pregnancy and has History of cesarean delivery followed by successful vaginal birth (VBAC); Maternal morbid obesity, antepartum (HCC); Anxiety and depression; and Supervision of high-risk pregnancy on their problem list.  Patient reports no complaints during our encounter. Contractions: Not present.  .  Movement: Present. Denies leaking of fluid.   The following portions of the patient's history were reviewed and updated as appropriate: allergies, current medications, past family history, past medical history, past social history, past surgical history and problem list.   Objective:   Vitals:   06/26/20 0857  BP: (!) 117/58  Pulse: (!) 105  Weight: 246 lb (111.6 kg)    Fetal Status: Fetal Heart Rate (bpm): 159   Movement: Present     General:  Alert, oriented and cooperative. Patient is in no acute distress.  Skin: Skin is warm and dry. No rash noted.   Cardiovascular: Normal heart rate noted  Respiratory: Normal respiratory effort, no problems with respiration noted  Abdomen: Soft, gravid, appropriate for gestational age.  Pain/Pressure: Present     Pelvic: Cervical exam deferred        Extremities: Normal range of motion.  Edema: None  Mental Status: Normal mood and affect. Normal behavior. Normal judgment and thought content.   Assessment and Plan:  Pregnancy: G3P2002 at [redacted]w[redacted]d 1. History of cesarean delivery followed by successful vaginal birth (VBAC) Counseled regarding TOLAC vs RCS; risks/benefits discussed in detail. All questions answered.  Patient elects for TOLAC, consent signed 06/26/2020.  2. Depression affecting pregnancy in third trimester, antepartum 3. Positive PHQ-9 screening with a score of 20 This was brought to my attention after my encounter with patient, she  had filled out screen in lab. Answered "half of the time" for Question #9 regarding thoughts about self- harm. History of anxiety and depression.  Our [facility] clinician was immediately notified, and she was able to talk to patient today. Will follow up with her recommendations. Of note, patient has known history of anxiety and depression, no current medications. - Amb ref to Integrated Behavioral Health  4. Maternal morbid obesity, antepartum (HCC) TWG -4 lbs, 11-20 lbs recommended. EFW adequate. Recommended ASA therapy.  5. History of gestational diabetes in prior pregnancy, currently pregnant GTT being done today, will follow up results and manage accordingly.  6. [redacted] weeks gestation of pregnancy 7. Supervision of high risk pregnancy in third trimester Third trimester labs being done today, will follow up results and manage accordingly. Declines all vaccines. Preterm labor symptoms and general obstetric precautions including but not limited to vaginal bleeding, contractions, leaking of fluid and fetal movement were reviewed in detail with the patient. Please refer to After Visit Summary for other counseling recommendations.   Return in about 2 weeks (around 07/10/2020) for OFFICE OB VISIT (MD or APP).  Future Appointments  Date Time Provider Department Center  06/26/2020 10:45 AM Peter Congo [facility]  07/23/2020  3:00 PM [facility] NURSE [facility]  07/23/2020  3:15 PM [facility] US3 [facility]    Jaynie Collins, MD

## 2020-06-27 ENCOUNTER — Other Ambulatory Visit: Payer: Self-pay | Admitting: Obstetrics & Gynecology

## 2020-06-27 ENCOUNTER — Encounter: Payer: Self-pay | Admitting: Obstetrics & Gynecology

## 2020-06-27 DIAGNOSIS — O99013 Anemia complicating pregnancy, third trimester: Secondary | ICD-10-CM

## 2020-06-27 LAB — RPR: RPR Ser Ql: NONREACTIVE

## 2020-06-27 LAB — COMPREHENSIVE METABOLIC PANEL
ALT: 9 IU/L (ref 0–32)
AST: 6 IU/L (ref 0–40)
Albumin/Globulin Ratio: 1.4 (ref 1.2–2.2)
Albumin: 3.5 g/dL — ABNORMAL LOW (ref 3.9–5.0)
Alkaline Phosphatase: 43 IU/L — ABNORMAL LOW (ref 44–121)
BUN/Creatinine Ratio: 9 (ref 9–23)
BUN: 4 mg/dL — ABNORMAL LOW (ref 6–20)
Bilirubin Total: <0.2 mg/dL (ref 0.0–1.2)
CO2: 17 mmol/L — ABNORMAL LOW (ref 20–29)
Calcium: 9.1 mg/dL (ref 8.7–10.2)
Chloride: 100 mmol/L (ref 96–106)
Creatinine, Ser: 0.44 mg/dL — ABNORMAL LOW (ref 0.57–1.00)
GFR calc Af Amer: 157 mL/min/{1.73_m2} (ref >59)
GFR calc non Af Amer: 136 mL/min/{1.73_m2} (ref >59)
Globulin, Total: 2.5 g/dL (ref 1.5–4.5)
Glucose: 179 mg/dL — ABNORMAL HIGH (ref 65–99)
Potassium: 3.9 mmol/L (ref 3.5–5.2)
Sodium: 134 mmol/L (ref 134–144)
Total Protein: 6 g/dL (ref 6.0–8.5)

## 2020-06-27 LAB — CBC
Hematocrit: 29.9 % — ABNORMAL LOW (ref 34.0–46.6)
Hemoglobin: 10.2 g/dL — ABNORMAL LOW (ref 11.1–15.9)
MCH: 30.8 pg (ref 26.6–33.0)
MCHC: 34.1 g/dL (ref 31.5–35.7)
MCV: 90 fL (ref 79–97)
Platelets: 275 10*3/uL (ref 150–450)
RBC: 3.31 x10E6/uL — ABNORMAL LOW (ref 3.77–5.28)
RDW: 13.5 % (ref 11.7–15.4)
WBC: 5.8 10*3/uL (ref 3.4–10.8)

## 2020-06-27 LAB — GLUCOSE TOLERANCE, 2 HOURS W/ 1HR
Glucose, 1 hour: 181 mg/dL — ABNORMAL HIGH (ref 65–179)
Glucose, 2 hour: 131 mg/dL (ref 65–152)
Glucose, Fasting: 109 mg/dL — ABNORMAL HIGH (ref 65–91)

## 2020-06-27 LAB — HIV ANTIBODY (ROUTINE TESTING W REFLEX): HIV Screen 4th Generation wRfx: NONREACTIVE

## 2020-06-27 MED ORDER — FERROUS SULFATE 325 (65 FE) MG PO TABS: 325.0000 mg | ORAL_TABLET | ORAL | 3 refills | Status: DC

## 2020-06-28 ENCOUNTER — Encounter: Payer: Self-pay | Admitting: *Deleted

## 2020-06-28 ENCOUNTER — Telehealth: Payer: Self-pay | Admitting: *Deleted

## 2020-06-28 DIAGNOSIS — O24419 Gestational diabetes mellitus in pregnancy, unspecified control: Secondary | ICD-10-CM

## 2020-06-28 NOTE — Telephone Encounter (Addendum)
-----   Message from Tereso Newcomer, MD sent at 06/27/2020  5:37 PM EST ----- Patient's 2 hr GTT is abnormal and is consistent with Gestational Diabetes [ ]  GDM education and testing supplies [ ]  Nutrition consult [ ]  Growth scan around time of diagnosis, if indicated [ ]  Enroll in [employer] Diabetes Program Please call to inform patient of results and recommendations.  12/17  0840  Called pt to inform her of test results and plan of care. She did not answer either phone number on file. Messages left stating that I am calling with results and recommendations from the doctor. A Mychart message will be sent with this information and she may respond if she has questions.

## 2020-07-04 ENCOUNTER — Other Ambulatory Visit: Payer: No Typology Code available for payment source

## 2020-07-10 ENCOUNTER — Encounter: Payer: No Typology Code available for payment source | Admitting: Family Medicine

## 2020-07-10 NOTE — BH Specialist Note (Signed)
Integrated Behavioral Health via Telemedicine Visit  07/10/2020 Colleen Fields [id number]  Pt did not arrive to video visit and did not answer the phone ; Left HIPPA-compliant message to call back Asher Muir from Center for [employer] at [facility] at [phone] (main office) or [phone] (Skii Cleland's office).  ; left MyChart message for patient.    Jacinta Shoe, LCSW   Depression screen [facility] 2/9 06/26/2020 05/29/2020 05/03/2020 03/22/2020 09/09/2018  Decreased Interest 2 3 2 2 1   Down, Depressed, Hopeless 2 3 2 2  0  PHQ - 2 Score 4 6 4 4 1   Altered sleeping 3 3 2 2  -  Tired, decreased energy 3 3 2 2  -  Change in appetite 2 3 3 2  -  Feeling bad or failure about yourself  2 3 0 2 -  Trouble concentrating 2 3 3 2  -  Moving slowly or fidgety/restless 2 3 3 2  -  Suicidal thoughts 2 0 0 0 -  PHQ-9 Score 20 24 17 16  -   GAD 7 : Generalized Anxiety Score 06/26/2020 05/29/2020 05/03/2020 03/22/2020  Nervous, Anxious, on Edge 3 3 0 3  Control/stop worrying 2 3 1 3   Worry too much - different things 2 3 1 3   Trouble relaxing 2 3 1 3   Restless 1 0 0 3  Easily annoyed or irritable 3 3 3 3   Afraid - awful might happen 2 1 1 3   Total GAD 7 Score 15 16 7  21

## 2020-07-11 ENCOUNTER — Other Ambulatory Visit: Payer: No Typology Code available for payment source

## 2020-07-13 NOTE — L&D Delivery Note (Addendum)
OB/GYN Faculty Practice Delivery Note  Colleen Fields is a 30 y.o. [id number] s/p VBAC at [redacted]w[redacted]d. She was admitted for IOL-non-adherent A1GDM.  ROM: 5h 2m with clear fluid GBS Status: positive, treated adequately with PCN  Maximum Maternal Temperature: 99.71F  Labor Progress: Pitocin started 2/11 @0415  on 08/23/20 and FB dislodged 08/23/20 @1230 . AROM with clear fluid and IUPC placed @1000  on 2/12. Pitocin was titrated and patient progressed well.   Delivery Date/Time: 08/24/20, 1506 Delivery: Called to room and patient was complete and pushing. Head delivered LOA. No nuchal cord present. Shoulder and body delivered in usual fashion. Infant with spontaneous cry, placed on mother's abdomen, dried and stimulated. Cord clamped x 2 after 1-minute delay, and cut by FOB under my direct supervision. Cord blood drawn. Placenta delivered spontaneously with gentle cord traction. Fundus firm with massage and Pitocin. Labia, perineum, vagina, and cervix were inspected, with a 1st degree perineal laceration that was repaired with a 3.0 vicryl suture.    Placenta: complete, three vessel cord appreciated, sent to pathology in the setting of maternal elevated temp, fetal tachycardia, and maternal tachycardia that resolved with LR bolus, no antibiotics Complications: none  Lacerations: 1st degree perineal s/p repair  EBL: 75 mL Analgesia: epidural and local lidocaine for laceration repair    Infant: APGARs 8,9  weight pending  , DO [facility] 4[facility]12, 10[facility]   GME ATTESTATION:  I saw and evaluated the patient. I agree with the findings and the plan of care as documented in the resident's note. I was gowned and gloved for entirety of delivery.  Franchot Erichsen, MD OB Fellow, Faculty [facility], Center for [facility] 08/24/2020 5:21 PM

## 2020-07-17 ENCOUNTER — Ambulatory Visit: Payer: No Typology Code available for payment source | Admitting: Clinical

## 2020-07-17 DIAGNOSIS — Z91199 Patient's noncompliance with other medical treatment and regimen due to unspecified reason: Secondary | ICD-10-CM

## 2020-07-17 DIAGNOSIS — Z5329 Procedure and treatment not carried out because of patient's decision for other reasons: Secondary | ICD-10-CM

## 2020-07-23 ENCOUNTER — Ambulatory Visit: Payer: No Typology Code available for payment source | Admitting: *Deleted

## 2020-07-23 ENCOUNTER — Ambulatory Visit: Payer: No Typology Code available for payment source | Attending: Obstetrics and Gynecology

## 2020-07-23 ENCOUNTER — Encounter: Payer: Self-pay | Admitting: *Deleted

## 2020-07-23 ENCOUNTER — Other Ambulatory Visit: Payer: Self-pay

## 2020-07-23 ENCOUNTER — Encounter: Payer: No Typology Code available for payment source | Admitting: Family Medicine

## 2020-07-23 DIAGNOSIS — O99210 Obesity complicating pregnancy, unspecified trimester: Secondary | ICD-10-CM

## 2020-07-23 DIAGNOSIS — Z98891 History of uterine scar from previous surgery: Secondary | ICD-10-CM | POA: Diagnosis not present

## 2020-07-23 DIAGNOSIS — O99213 Obesity complicating pregnancy, third trimester: Secondary | ICD-10-CM

## 2020-07-23 DIAGNOSIS — O09293 Supervision of pregnancy with other poor reproductive or obstetric history, third trimester: Secondary | ICD-10-CM | POA: Insufficient documentation

## 2020-07-23 DIAGNOSIS — O24410 Gestational diabetes mellitus in pregnancy, diet controlled: Secondary | ICD-10-CM | POA: Diagnosis not present

## 2020-07-23 DIAGNOSIS — O34219 Maternal care for unspecified type scar from previous cesarean delivery: Secondary | ICD-10-CM | POA: Diagnosis not present

## 2020-07-23 DIAGNOSIS — Z8632 Personal history of gestational diabetes: Secondary | ICD-10-CM | POA: Diagnosis not present

## 2020-07-23 DIAGNOSIS — Z3A33 33 weeks gestation of pregnancy: Secondary | ICD-10-CM

## 2020-07-24 ENCOUNTER — Other Ambulatory Visit: Payer: Self-pay | Admitting: *Deleted

## 2020-07-24 DIAGNOSIS — O24410 Gestational diabetes mellitus in pregnancy, diet controlled: Secondary | ICD-10-CM

## 2020-07-30 ENCOUNTER — Encounter: Payer: No Typology Code available for payment source | Admitting: Obstetrics and Gynecology

## 2020-07-30 ENCOUNTER — Telehealth: Payer: Self-pay | Admitting: Obstetrics and Gynecology

## 2020-07-30 NOTE — Telephone Encounter (Signed)
Attempted to reach patient about her missed OB appointment. Was not able to leave a voicemail message.

## 2020-07-31 ENCOUNTER — Encounter: Payer: Self-pay | Admitting: Obstetrics & Gynecology

## 2020-07-31 ENCOUNTER — Ambulatory Visit (INDEPENDENT_AMBULATORY_CARE_PROVIDER_SITE_OTHER): Payer: No Typology Code available for payment source | Admitting: Obstetrics & Gynecology

## 2020-07-31 ENCOUNTER — Other Ambulatory Visit: Payer: Self-pay

## 2020-07-31 VITALS — BP 118/70 | HR 106 | Wt 244.4 lb

## 2020-07-31 DIAGNOSIS — O99210 Obesity complicating pregnancy, unspecified trimester: Secondary | ICD-10-CM

## 2020-07-31 DIAGNOSIS — Z3A33 33 weeks gestation of pregnancy: Secondary | ICD-10-CM

## 2020-07-31 DIAGNOSIS — O24419 Gestational diabetes mellitus in pregnancy, unspecified control: Secondary | ICD-10-CM

## 2020-07-31 DIAGNOSIS — Z98891 History of uterine scar from previous surgery: Secondary | ICD-10-CM

## 2020-07-31 DIAGNOSIS — O0993 Supervision of high risk pregnancy, unspecified, third trimester: Secondary | ICD-10-CM

## 2020-07-31 NOTE — Progress Notes (Signed)
PRENATAL VISIT NOTE  Subjective:  Colleen Fields is Fields 30 y.o. G3P2002 at [redacted]w[redacted]d being seen today for ongoing prenatal care.  She is currently monitored for the following issues for this high-risk pregnancy and has History of cesarean delivery followed by successful vaginal birth (VBAC); Maternal morbid obesity, antepartum (HCC); Gestational diabetes mellitus (GDM) in third trimester; Anxiety and depression; Supervision of high-risk pregnancy; and Anemia of pregnancy in third trimester on their problem list.  Patient reports no complaints.  Contractions: Irritability. Vag. Bleeding: None.  Movement: Present. Denies leaking of fluid.   The following portions of the patient's history were reviewed and updated as appropriate: allergies, current medications, past family history, past medical history, past social history, past surgical history and problem list.   Objective:   Vitals:   07/31/20 1605  BP: 118/70  Pulse: (!) 106  Weight: 244 lb 6.4 oz (110.9 kg)    Fetal Status: Fetal Heart Rate (bpm): 156   Movement: Present     General:  Alert, oriented and cooperative. Patient is in no acute distress.  Skin: Skin is warm and dry. No rash noted.   Cardiovascular: Normal heart rate noted  Respiratory: Normal respiratory effort, no problems with respiration noted  Abdomen: Soft, gravid, appropriate for gestational age.  Pain/Pressure: Present     Pelvic: Cervical exam deferred        Extremities: Normal range of motion.  Edema: None  Mental Status: Normal mood and affect. Normal behavior. Normal judgment and thought content.    Imaging: [place] MFM OB FOLLOW UP  Result Date: 07/23/2020 ----------------------------------------------------------------------  OBSTETRICS REPORT                       (Signed Final 07/23/2020 04:24 pm) ---------------------------------------------------------------------- Patient Info  ID #:       [id number]                          D.O.B.:  09/07/1990 (29 yrs)   Name:       Colleen Fields [facility]            Visit Date: 07/23/2020 03:07 pm ---------------------------------------------------------------------- Performed By  Attending:        Noralee Space MD        Ref. Address:     [facility] for                                                             [facility]                                                             Healthcare  Performed By:     Eden Lathe BS      Location:         [facility]                    RDMS RVT  [facility] at                                                             [facility]  Referred By:      Colleen Fields                    BASS MD ---------------------------------------------------------------------- Orders  #  Description                           Code        Ordered By  1  US MFM OB FOLLOW UP                   [id number]    Lin LandsmanORENTHIAN                                                       BOOKER ----------------------------------------------------------------------  #  Order #                     Accession #                Episode #  1  [id number]                   [id number]                 [id number] ---------------------------------------------------------------------- Indications  Encounter for other antenatal screening        Z36.2  follow-up  Gestational diabetes in pregnancy, diet        O24.410  controlled  Obesity complicating pregnancy, third          O99.213  trimester  History of cesarean delivery, currently        O34.219  pregnant  Poor obstetric history: Previous gestational   O09.299  diabetes  [redacted] weeks gestation of pregnancy                Z3A.33 ---------------------------------------------------------------------- Fetal Evaluation  Num Of Fetuses:         1  Fetal Heart Rate(bpm):  158  Cardiac Activity:       Observed  Presentation:           Cephalic  Placenta:               Anterior  P. Cord Insertion:       Previously Visualized  Amniotic Fluid  AFI FV:      Within normal limits  AFI Sum(cm)     %Tile       Largest Pocket(cm)  13              40          4.3  RUQ(cm)       RLQ(cm)       LUQ(cm)        LLQ(cm)  2.9           4             4.3            1.8 ---------------------------------------------------------------------- Biometry  BPD:      82.9  mm     G. Age:  33w 2d         54  %    CI:        74.14   %    70 - 86                                                          FL/HC:      20.3   %    19.9 - 21.5  HC:      305.7  mm     G. Age:  34w 0d         39  %    HC/AC:      1.09        0.96 - 1.11  AC:       280   mm     G. Age:  32w 0d         24  %    FL/BPD:     74.9   %    71 - 87  FL:       62.1  mm     G. Age:  32w 1d         19  %    FL/AC:      22.2   %    20 - 24  HUM:      55.2  mm     G. Age:  32w 1d         41  %  LV:        7.8  mm  Est. FW:    1965  gm      4 lb 5 oz     24  % ---------------------------------------------------------------------- OB History  Blood Type:   O+  Gravidity:    3         Term:   2        Prem:   0        SAB:   0  TOP:          0       Ectopic:  0        Living: 2 ---------------------------------------------------------------------- Gestational Age  U/S Today:     32w 6d                                        EDD:   09/11/20  Best:          33w 0d     Det. ByMarcella Dubs         EDD:   09/10/20                                      (  03/05/20) ---------------------------------------------------------------------- Anatomy  Cranium:               Appears normal         Aortic Arch:            Appears normal  Cavum:                 Appears normal         Ductal Arch:            Previously seen  Ventricles:            Previously seen        Diaphragm:              Appears normal  Choroid Plexus:        Appears normal         Stomach:                Appears normal, left                                                                        sided  Cerebellum:             Previously seen        Abdomen:                Previously seen  Posterior Fossa:       Previously seen        Abdominal Wall:         Previously seen  Nuchal Fold:           Not applicable (>20    Cord Vessels:           Previously seen                         wks GA)  Face:                  Appears normal         Kidneys:                Appear normal                         (orbits and profile)  Lips:                  Appears normal         Bladder:                Appears normal  Thoracic:              Appears normal         Spine:                  Limited views  previously seen  Heart:                 Appears normal         Upper Extremities:      Previously seen                         (4CH, axis, and                         situs)  RVOT:                  Appears normal         Lower Extremities:      Previously seen  LVOT:                  Previously seen  Other:  Fetus appears to be Fields female. Nasal bone visualized previously. Heels          appear normal previously. Technically difficult due to maternal habitus          and fetal position. ---------------------------------------------------------------------- Cervix Uterus Adnexa  Cervix  Not visualized (advanced GA >24wks)  Uterus  No abnormality visualized.  Right Ovary  Not visualized.  Left Ovary  Not visualized.  Cul De Sac  No free fluid seen.  Adnexa  No abnormality visualized. ---------------------------------------------------------------------- Impression  Gestational diabetes. Well-controlled on diet .  Fetal growth is appropriate for gestational age .Amniotic fluid  is normal and good fetal activity is seen .Antenatal testing is  reassuring. BPP 8/8. ---------------------------------------------------------------------- Recommendations  -An appointment was made for her to return in 4 weeks for  fetal growth assessment.  ----------------------------------------------------------------------                  Noralee Spaceavi Shankar, MD Electronically Signed Final Report   07/23/2020 04:24 pm ----------------------------------------------------------------------   Assessment and Plan:  Pregnancy: [id number] at 6648w6d 1. Gestational diabetes mellitus (GDM) in third trimester, gestational diabetes method of control unspecified Will meet with DM education soon and start checking BS. History of insulin A2GDM last pregnancy, was on insulin for one week prior to IOL. Recent growth normal and normal AFV.  - US MFM FETAL BPP WO NON STRESS; Future  2. Maternal morbid obesity, antepartum (HCC) MFM recommended weekly testing starting at 36 weeks, serial growth scans - US MFM FETAL BPP WO NON STRESS; Future  3. History of cesarean delivery followed by successful vaginal birth (VBAC) Desires TOLAC, consent signed 06/26/20.  4. [redacted] weeks gestation of pregnancy 5. Supervision of high risk pregnancy in third trimester Preterm labor symptoms and general obstetric precautions including but not limited to vaginal bleeding, contractions, leaking of fluid and fetal movement were reviewed in detail with the patient. Please refer to After Visit Summary for other counseling recommendations.   Return in about 2 weeks (around 08/14/2020) for Pelvic cultures, OFFICE OB VISIT (MD only) but needs DM education ASAP.  Future Appointments  Date Time Provider Department Center  08/22/2020  3:45 PM [facility]    Jaynie CollinsUgonna Youssef Footman, MD

## 2020-07-31 NOTE — Patient Instructions (Signed)
Return to office for any scheduled appointments. Call the office or go to the MAU at [facility] at [facility] if:  You begin to have strong, frequent contractions  Your water breaks.  Sometimes it is a big gush of fluid, sometimes it is just a trickle that keeps getting your panties wet or running down your legs  You have vaginal bleeding.  It is normal to have a small amount of spotting if your cervix was checked.   You do not feel your baby moving like normal.  If you do not, get something to eat and drink and lay down and focus on feeling your baby move.   If your baby is still not moving like normal, you should call the office or go to MAU.  Any other obstetric concerns.   Gestational Diabetes Mellitus, Self-Care Caring for yourself after a diagnosis of gestational diabetes mellitus means keeping your blood sugar under control. This can be done through nutrition, exercise, lifestyle changes, insulin and other medicines, and support from your health care team. Your health care provider will set individualized treatment goals for you. What are the risks? If left untreated, gestational diabetes can cause problems for mother and baby. For the mother Women who get gestational diabetes are more likely to:  Have labor induced and deliver early.  Have problems during labor and delivery, if the baby is larger than normal. This includes difficult labor and damage to the birth canal.  Have a cesarean delivery.  Have problems with blood pressure, including high blood pressure and preeclampsia.  Get it again if they become pregnant.  Develop type 2 diabetes in the future. For the baby Gestational diabetes that is not treated can cause the baby to have:  Low blood glucose (hypoglycemia).  Larger-than-normal body size (macrosomia).  Breathing problems. How to monitor blood glucose Check your blood glucose every day and as often as told by your health care provider. To do  this: 1. Wash your hands with soap and water for at least 20 seconds. 2. Prick the side of your finger (not the tip) with the lancet. Use a different finger each time. 3. Gently rub the finger until a small drop of blood appears. 4. Follow instructions that come with your meter for inserting the test strip, applying blood to the strip, and getting the result. 5. Write down your result and any notes. Blood glucose goals are:  95 mg/dL (5.3 mmol/L) when fasting.  140 mg/dL (7.8 mmol/L) 1 hour after a meal.  120 mg/dL (6.7 mmol/L) 2 hours after a meal.   Follow these instructions at home: Medicines  Take over-the-counter and prescription medicines only as told by your health care provider.  If your health care provider prescribed insulin or other diabetes medicines: ? Take them every day. ? Do not run out of insulin or other medicines. Plan ahead so you always have them available. Eating and drinking  Follow instructions from your health care provider about eating or drinking restrictions.  See a diet and nutrition expert (registered dietician) to help you create an eating plan that helps control your diabetes. The foods in this plan will include: ? Lean proteins. ? Complex carbohydrates. These are carbohydrates that contain fiber, have a lot of nutrients, and are digested slowly. They include dried beans, nuts, and whole grain breads, cereals, or pasta. ? Fresh fruits and vegetables. ? Low-fat dairy products. ? Healthy fats.  Eat healthy snacks between nutritious meals.  Drink enough  fluid to keep your urine pale yellow.  Keep a record of the carbohydrates that you eat. To do this: ? Read food labels. ? Learn the standard serving sizes of foods.  Make a sick day plan with your health care provider before you get sick. Follow this plan whenever you cannot eat or drink as usual.   Activity  Do exercises as told by your health care provider.  Do 30 or more minutes of physical  activity a day, or as much physical activity as your health care provider recommends. It may help to control blood glucose levels after a meal if you: ? Do 10 minutes of exercise after each meal. ? Start this exercise 30 minutes after the meal.  If you start a new exercise or activity, work with your health care provider to adjust your insulin, other medicines, or food as needed. Lifestyle  Do not drink alcohol.  Do not use any products that contain nicotine or tobacco, such as cigarettes, e-cigarettes, and chewing tobacco. If you need help quitting, ask your health care provider.  Learn to manage stress. If you need help with this, ask your health care provider. Body care  Keep your vaccines up to date.  Practice good oral hygiene. To do this: ? Clean your teeth and gums two times a day. ? Floss one or more times a day. ? Visit your dentist one or more times every 6 months.  Stay at a healthy weight while you are pregnant. Your expected weight gain depends on your BMI (body mass index) before pregnancy. General instructions  Talk with your health care provider about the risk for high blood pressure during pregnancy (preeclampsia and eclampsia).  Share your diabetes management plan with people in your workplace, school, and household.  Check your urine for ketones when sick and as told by your health care provider. Ketones are made by the liver when a lack of glucose forces the body to use fat for energy.  Carry a medical alert card or wear medical alert jewelry that says you have gestational diabetes.  Keep all follow-up visits. This is important. Get care after delivery  Have your blood glucose level checked with an oral glucose tolerance test (OGTT) 4-12 weeks after delivery.  Get screened for diabetes at least every 3 years, or as often as told by your health care provider. Where to find more information  American Diabetes Association (ADA): diabetes.org  Association of  Diabetes Care & Education Specialists (ADCES): diabeteseducator.org  Centers for Disease Control and Prevention (CDC): TonerPromos.no  American Pregnancy Association: americanpregnancy.org  [place]. Department of Agriculture MyPlate: WrestlingReporter.dk Contact a health care provider if:  Your blood glucose is above your target for two tests in a row.  You have a fever.  You have been sick for 2 days or more and are not getting better.  You have either of these problems for more than 6 hours: ? Vomiting every time you eat or drink. ? Diarrhea. Get help right away if you:  Become confused or cannot think clearly.  Have trouble breathing.  Have moderate or high ketones in your urine.  Feel your baby is not moving as usual.  Develop unusual discharge or bleeding from your vagina.  Start having early (premature) contractions. Contractions may feel like a tightening in your lower abdomen  Have a severe headache. These symptoms may represent a serious problem that is an emergency. Do not wait to see if the symptoms will go away. Get medical  help right away. Call your local emergency services (911 in the [place] Do not drive yourself to the hospital. Summary  Check your blood glucose every day during your pregnancy. Do this as often as told by your health care provider.  Take insulin or other diabetes medicines every day, if your health care provider prescribed them.  Have your blood glucose level checked 4-12 weeks after delivery.  Keep all follow-up visits. This is important. This information is not intended to replace advice given to you by your health care provider. Make sure you discuss any questions you have with your health care provider. Document Revised: 12/04/2019 Document Reviewed: 12/04/2019 Elsevier Patient Education  2021 [employer].

## 2020-08-01 ENCOUNTER — Encounter: Payer: No Typology Code available for payment source | Attending: Obstetrics & Gynecology | Admitting: Registered"

## 2020-08-01 ENCOUNTER — Other Ambulatory Visit: Payer: Self-pay

## 2020-08-01 ENCOUNTER — Telehealth (INDEPENDENT_AMBULATORY_CARE_PROVIDER_SITE_OTHER): Payer: No Typology Code available for payment source | Admitting: Registered"

## 2020-08-01 DIAGNOSIS — Z3A00 Weeks of gestation of pregnancy not specified: Secondary | ICD-10-CM | POA: Diagnosis not present

## 2020-08-01 DIAGNOSIS — O24419 Gestational diabetes mellitus in pregnancy, unspecified control: Secondary | ICD-10-CM

## 2020-08-01 MED ORDER — GLUCOSE BLOOD VI STRP: ORAL_STRIP | 12 refills | Status: DC

## 2020-08-01 MED ORDER — ACCU-CHEK GUIDE W/DEVICE KIT: 1.0000 | PACK | Freq: Four times a day (QID) | 0 refills | Status: DC

## 2020-08-01 MED ORDER — ACCU-CHEK SOFTCLIX LANCETS MISC
100.0000 | Freq: Four times a day (QID) | 12 refills | Status: DC
Start: 2020-08-01 — End: 2020-08-26

## 2020-08-01 NOTE — Progress Notes (Addendum)
Virtual Visit via Video Note  I connected with Colleen Fields on 08/01/20 at  3:15 PM EST by a video enabled telemedicine application and verified that I am speaking with the correct person using two identifiers.  Location: Patient: home Provider: [facility] [place]   I discussed the limitations of evaluation and management by telemedicine and the availability of in person appointments. The patient expressed understanding and agreed to proceed.   Patient was seen for Gestational Diabetes self-management. EDD 09/12/20. Patient states prior history of GDM. Per chart patient has history of insulin A2GDM last pregnancy, was on insulin for one week prior to IOL, 03/21/20 A1c 5.7%  Diet history obtained. Patient eats variety of all food groups. Beverages include water, small amount of orange juice to take prenatal vitamin. Pt states she recently starting having craving for icees. Pt states her husband is pescatarian, but she also eats other meat and does not eat fish more than 2x/week. Pt reports with all pregnancies she has reduced appetite and relies on Ensure for some of her dietary intake.   The following learning objectives were met by the patient :   States the definition of Gestational Diabetes  States why dietary management is important in controlling blood glucose  Describes the effects of carbohydrates on blood glucose levels  Demonstrates ability to create a balanced meal plan  Demonstrates carbohydrate counting   States when to check blood glucose levels  Demonstrates proper blood glucose monitoring techniques  States the effect of stress and exercise on blood glucose levels  States the importance of limiting caffeine and abstaining from alcohol and smoking  Plan:  Aim for 3 Carbohydrate Choices per meal (45 grams) +/- 1 either way  Aim for 1-2 Carbohydrate Choices per snack Begin reading food labels for Total  Carbohydrate of foods If OK with your MD, consider  increasing your activity level by walking, Arm Chair Exercises or other activity daily as tolerated Begin checking Blood Glucose before breakfast and 2 hours after first bite of breakfast, lunch and dinner as directed by MD  Bring Log Book/Sheet and meter to every medical appointment - Pt states she will use MySugr app Take medication if directed by MD  Rx order placed for  Accu-chek Guide strips and Softclix lancets  Patient instructed to monitor glucose levels: FBS: 60 - 95 mg/dl 2 hour: <120 mg/dl  Patient received the following handouts:  Nutrition Diabetes and Pregnancy  Carbohydrate Counting List  Patient will be seen for follow-up as needed.  I discussed the assessment and treatment plan with the patient. The patient was provided an opportunity to ask questions and all were answered. The patient agreed with the plan and demonstrated an understanding of the instructions.   The patient was advised to call back or seek an in-person evaluation if the symptoms worsen or if the condition fails to improve as anticipated.  I provided 60 minutes of non-face-to-face time during this encounter.  Levie Heritage, RD, LDN, CDCES

## 2020-08-06 ENCOUNTER — Other Ambulatory Visit: Payer: Self-pay

## 2020-08-14 ENCOUNTER — Other Ambulatory Visit: Payer: Self-pay

## 2020-08-14 ENCOUNTER — Ambulatory Visit (INDEPENDENT_AMBULATORY_CARE_PROVIDER_SITE_OTHER): Payer: No Typology Code available for payment source | Admitting: Obstetrics & Gynecology

## 2020-08-14 ENCOUNTER — Other Ambulatory Visit (HOSPITAL_COMMUNITY)
Admission: RE | Admit: 2020-08-14 | Discharge: 2020-08-14 | Disposition: A | Discharge status: Home / Self Care | Payer: No Typology Code available for payment source | Source: Ambulatory Visit | Attending: Obstetrics & Gynecology | Admitting: Obstetrics & Gynecology

## 2020-08-14 VITALS — BP 123/79 | HR 97 | Wt 246.2 lb

## 2020-08-14 DIAGNOSIS — O0990 Supervision of high risk pregnancy, unspecified, unspecified trimester: Secondary | ICD-10-CM

## 2020-08-14 DIAGNOSIS — Z98891 History of uterine scar from previous surgery: Secondary | ICD-10-CM | POA: Diagnosis not present

## 2020-08-14 DIAGNOSIS — Z3A35 35 weeks gestation of pregnancy: Secondary | ICD-10-CM

## 2020-08-14 DIAGNOSIS — O24419 Gestational diabetes mellitus in pregnancy, unspecified control: Secondary | ICD-10-CM

## 2020-08-14 DIAGNOSIS — O98819 Other maternal infectious and parasitic diseases complicating pregnancy, unspecified trimester: Secondary | ICD-10-CM

## 2020-08-14 DIAGNOSIS — F32A Depression, unspecified: Secondary | ICD-10-CM

## 2020-08-14 DIAGNOSIS — O99013 Anemia complicating pregnancy, third trimester: Secondary | ICD-10-CM

## 2020-08-14 DIAGNOSIS — O99210 Obesity complicating pregnancy, unspecified trimester: Secondary | ICD-10-CM

## 2020-08-14 DIAGNOSIS — B951 Streptococcus, group B, as the cause of diseases classified elsewhere: Secondary | ICD-10-CM

## 2020-08-14 DIAGNOSIS — F419 Anxiety disorder, unspecified: Secondary | ICD-10-CM

## 2020-08-14 LAB — OB RESULTS CONSOLE GC/CHLAMYDIA: Gonorrhea: NEGATIVE

## 2020-08-14 LAB — GLUCOSE, CAPILLARY: Glucose-Capillary: 63 mg/dL — ABNORMAL LOW (ref 70–99)

## 2020-08-14 NOTE — Progress Notes (Signed)
   PRENATAL VISIT NOTE  Subjective:  Colleen Fields is a 30 y.o. G3P2002 at [redacted]w[redacted]d being seen today for ongoing prenatal care.  She is currently monitored for the following issues for this high-risk pregnancy and has History of cesarean delivery followed by successful vaginal birth (VBAC); Maternal morbid obesity, antepartum (HCC); GDM (gestational diabetes mellitus); Anxiety and depression; Supervision of high-risk pregnancy; and Anemia of pregnancy in third trimester on their problem list.  Patient reports low back pain.  Contractions: Irregular. Vag. Bleeding: None.  Movement: Present. Denies leaking of fluid.   The following portions of the patient's history were reviewed and updated as appropriate: allergies, current medications, past family history, past medical history, past social history, past surgical history and problem list.   Objective:   Vitals:   08/14/20 1603  BP: 123/79  Pulse: 97  Weight: 246 lb 3.2 oz (111.7 kg)    Fetal Status: Fetal Heart Rate (bpm): 138   Movement: Present     General:  Alert, oriented and cooperative. Patient is in no acute distress.  Skin: Skin is warm and dry. No rash noted.   Cardiovascular: Normal heart rate noted  Respiratory: Normal respiratory effort, no problems with respiration noted  Abdomen: Soft, gravid, appropriate for gestational age.  Pain/Pressure: Present     Pelvic: Cervical exam deferred        Extremities: Normal range of motion.  Edema: Trace  Mental Status: Normal mood and affect. Normal behavior. Normal judgment and thought content.   Assessment and Plan:  Pregnancy: G3P2002 at [redacted]w[redacted]d 1. [redacted] weeks gestation of pregnancy - GC/Chl, GBS cultures obtained today  2. History of cesarean delivery followed by successful vaginal birth (VBAC) - VBAC consent done 06/26/2020  3. Maternal morbid obesity, antepartum (HCC)  4. Supervision of high risk pregnancy, antepartum  5. Anemia of pregnancy in third trimester - taking  iron daily.  D/w pt taking qod  6. Anxiety and depression  7. Gestational diabetes mellitus (GDM), antepartum, gestational diabetes method of control unspecified - not checking BS.  States she doesn't have her meter yet but will have husband get it.  Importance of checking BSs discussed.  Pt does know how to check. - Growth scan scheduled 08/22/2020  Preterm labor symptoms and general obstetric precautions including but not limited to vaginal bleeding, contractions, leaking of fluid and fetal movement were reviewed in detail with the patient. Please refer to After Visit Summary for other counseling recommendations.   Return in about 1 week (around 08/21/2020) for Office Ob visit (MD only).  Future Appointments  Date Time Provider Department Center  08/22/2020  3:30 PM [facility] NURSE [facility]  08/22/2020  3:45 PM [facility] US4 [facility]    Jerene Bears, MD

## 2020-08-14 NOTE — Progress Notes (Signed)
Here for ob visit today. Elevated phq9 and gad 7 . Has seen Asher Muir [facility] previously- denies need to see her today. States hasnt' been able to go get meter and hasn't checked her cbg's. States she feels like baby is moving a lot- and wonders if baby is in distress.Random cbg in office+63, states ate around 2:30  Bag of doritos and water.  Simon Llamas,RN

## 2020-08-15 LAB — GC/CHLAMYDIA PROBE AMP (~~LOC~~) NOT AT ARMC
Chlamydia: NEGATIVE
Comment: NEGATIVE
Comment: NORMAL
Neisseria Gonorrhea: NEGATIVE

## 2020-08-20 DIAGNOSIS — B951 Streptococcus, group B, as the cause of diseases classified elsewhere: Secondary | ICD-10-CM | POA: Insufficient documentation

## 2020-08-20 DIAGNOSIS — O98819 Other maternal infectious and parasitic diseases complicating pregnancy, unspecified trimester: Secondary | ICD-10-CM | POA: Insufficient documentation

## 2020-08-20 LAB — CULTURE, BETA STREP (GROUP B ONLY): Strep Gp B Culture: POSITIVE — AB

## 2020-08-21 ENCOUNTER — Ambulatory Visit (INDEPENDENT_AMBULATORY_CARE_PROVIDER_SITE_OTHER): Payer: No Typology Code available for payment source | Admitting: Obstetrics and Gynecology

## 2020-08-21 ENCOUNTER — Other Ambulatory Visit: Payer: Self-pay | Admitting: Advanced Practice Midwife

## 2020-08-21 ENCOUNTER — Other Ambulatory Visit: Payer: Self-pay

## 2020-08-21 VITALS — BP 120/74 | HR 101 | Wt 244.8 lb

## 2020-08-21 DIAGNOSIS — O99210 Obesity complicating pregnancy, unspecified trimester: Secondary | ICD-10-CM

## 2020-08-21 DIAGNOSIS — Z6841 Body Mass Index (BMI) 40.0 and over, adult: Secondary | ICD-10-CM

## 2020-08-21 DIAGNOSIS — Z9119 Patient's noncompliance with other medical treatment and regimen: Secondary | ICD-10-CM

## 2020-08-21 DIAGNOSIS — O09899 Supervision of other high risk pregnancies, unspecified trimester: Secondary | ICD-10-CM

## 2020-08-21 DIAGNOSIS — Z98891 History of uterine scar from previous surgery: Secondary | ICD-10-CM

## 2020-08-21 DIAGNOSIS — O0993 Supervision of high risk pregnancy, unspecified, third trimester: Secondary | ICD-10-CM

## 2020-08-21 DIAGNOSIS — O24419 Gestational diabetes mellitus in pregnancy, unspecified control: Secondary | ICD-10-CM

## 2020-08-21 MED ORDER — GLUCOSE BLOOD VI STRP: ORAL_STRIP | 12 refills | Status: DC

## 2020-08-21 NOTE — Progress Notes (Signed)
Pt has not started to check glucose

## 2020-08-21 NOTE — Progress Notes (Signed)
Prenatal Visit Note Date: 08/21/2020 Clinic: [facility]  Subjective:  Colleen Fields is a 30 y.o. [id number] at [redacted]w[redacted]d being seen today for ongoing prenatal care.  She is currently monitored for the following issues for this high-risk pregnancy and has History of cesarean delivery followed by successful vaginal birth (VBAC); Obesity in pregnancy; GDM (gestational diabetes mellitus); Anxiety and depression; Supervision of high-risk pregnancy; Anemia of pregnancy in third trimester; Obstructive sleep apnea on CPAP; Group B streptococcal infection during pregnancy; BMI 45.0-49.9, adult (HCC); and Noncompliant pregnant patient, antepartum on their problem list.  Patient reports no complaints.   Contractions: Irritability. Vag. Bleeding: None.  Movement: Present. Denies leaking of fluid.   The following portions of the patient's history were reviewed and updated as appropriate: allergies, current medications, past family history, past medical history, past social history, past surgical history and problem list. Problem list updated.  Objective:   Vitals:   08/21/20 1623  BP: 120/74  Pulse: (!) 101  Weight: 244 lb 12.8 oz (111 kg)    Fetal Status: Fetal Heart Rate (bpm): 152   Movement: Present     General:  Alert, oriented and cooperative. Patient is in no acute distress.  Skin: Skin is warm and dry. No rash noted.   Cardiovascular: Normal heart rate noted  Respiratory: Normal respiratory effort, no problems with respiration noted  Abdomen: Soft, gravid, appropriate for gestational age. Pain/Pressure: Present     Pelvic:  Cervical exam deferred        Extremities: Normal range of motion.  Edema: Trace  Mental Status: Normal mood and affect. Normal behavior. Normal judgment and thought content.   Urinalysis:      Assessment and Plan:  Pregnancy: G3P2002 at [redacted]w[redacted]d  1. Supervision of high risk pregnancy in third trimester  2. Gestational diabetes mellitus (GDM),  antepartum, gestational diabetes method of control unspecified Random CBG in clinic 32 with last food a snack 3 hours prior. Patient was diagnosed with GDM on 12/15, but did not see DM education and teaching until 1/20; I see six patient cancellations and no show visits from the time of her dx until she saw DM education. She states she picked up her DM supplies on Monday but her strips were not covered so she has not been checking her sugars. She states everything else was free but the strips cost approximately $56 and that they don't have the money to buy these. I asked her if she had tried before Monday to get her DM supplies and she states that they had not and that they have had a lot of financial stressors.  The RN called the pharmacy and the strips were now covered because the Rx had to be changed from as directed to qid. I told her glycemic control is very important with IUFD risk being the most concerning. I told her to please pick up her strips and check her sugars but I told her that given her poor control she has an indication for 37wk delivery. I d/w her risk of pulmonary, feeding, glycemic control, temperature issues with early term but this is likely the safest way to proceed with her, which she is amenable to. She desires 2330pm IOL on 2/10 and this was set up by the RN.  Pt had IOL with last delivery/VBAC. Slightly increased rupture risk with IOL d/w her   Pt told to keep u/s appt for tomorrow.   - glucose blood test strip; Use as instructed 4 times daily  Dispense: 100 each; Refill: 12  3. History of cesarean delivery followed by successful vaginal birth (VBAC)  4. Obesity in pregnancy  5. BMI 45.0-49.9, adult (HCC)  6. Noncompliant pregnant patient, antepartum  Preterm labor symptoms and general obstetric precautions including but not limited to vaginal bleeding, contractions, leaking of fluid and fetal movement were reviewed in detail with the patient. Please refer to After Visit  Summary for other counseling recommendations.  Return in about 1 day (around 08/22/2020) for already scheduled.   Cameron Bing, MD

## 2020-08-22 ENCOUNTER — Encounter (HOSPITAL_COMMUNITY): Payer: Self-pay | Admitting: Obstetrics and Gynecology

## 2020-08-22 ENCOUNTER — Encounter: Payer: Self-pay | Admitting: *Deleted

## 2020-08-22 ENCOUNTER — Other Ambulatory Visit: Payer: Self-pay

## 2020-08-22 ENCOUNTER — Other Ambulatory Visit: Payer: Self-pay | Admitting: Obstetrics and Gynecology

## 2020-08-22 ENCOUNTER — Ambulatory Visit: Payer: No Typology Code available for payment source | Admitting: *Deleted

## 2020-08-22 ENCOUNTER — Ambulatory Visit (HOSPITAL_BASED_OUTPATIENT_CLINIC_OR_DEPARTMENT_OTHER): Payer: No Typology Code available for payment source

## 2020-08-22 DIAGNOSIS — O99213 Obesity complicating pregnancy, third trimester: Secondary | ICD-10-CM

## 2020-08-22 DIAGNOSIS — O34219 Maternal care for unspecified type scar from previous cesarean delivery: Secondary | ICD-10-CM | POA: Diagnosis not present

## 2020-08-22 DIAGNOSIS — O24410 Gestational diabetes mellitus in pregnancy, diet controlled: Secondary | ICD-10-CM

## 2020-08-22 DIAGNOSIS — Z98891 History of uterine scar from previous surgery: Secondary | ICD-10-CM

## 2020-08-22 DIAGNOSIS — O0993 Supervision of high risk pregnancy, unspecified, third trimester: Secondary | ICD-10-CM | POA: Insufficient documentation

## 2020-08-22 DIAGNOSIS — Z362 Encounter for other antenatal screening follow-up: Secondary | ICD-10-CM | POA: Diagnosis not present

## 2020-08-22 DIAGNOSIS — O99210 Obesity complicating pregnancy, unspecified trimester: Secondary | ICD-10-CM

## 2020-08-22 DIAGNOSIS — Z3A37 37 weeks gestation of pregnancy: Secondary | ICD-10-CM

## 2020-08-22 DIAGNOSIS — O09293 Supervision of pregnancy with other poor reproductive or obstetric history, third trimester: Secondary | ICD-10-CM

## 2020-08-23 ENCOUNTER — Other Ambulatory Visit: Payer: Self-pay

## 2020-08-23 ENCOUNTER — Inpatient Hospital Stay (HOSPITAL_COMMUNITY)
Admission: AD | Admit: 2020-08-23 | Discharge: 2020-08-26 | DRG: 806 | Disposition: A | Discharge status: Home / Self Care | Payer: No Typology Code available for payment source | Attending: Obstetrics and Gynecology | Admitting: Obstetrics and Gynecology

## 2020-08-23 ENCOUNTER — Inpatient Hospital Stay (HOSPITAL_COMMUNITY): Payer: No Typology Code available for payment source

## 2020-08-23 ENCOUNTER — Encounter (HOSPITAL_COMMUNITY): Payer: Self-pay | Admitting: Obstetrics and Gynecology

## 2020-08-23 ENCOUNTER — Inpatient Hospital Stay (HOSPITAL_COMMUNITY): Payer: No Typology Code available for payment source | Admitting: Anesthesiology

## 2020-08-23 DIAGNOSIS — O24420 Gestational diabetes mellitus in childbirth, diet controlled: Secondary | ICD-10-CM | POA: Diagnosis not present

## 2020-08-23 DIAGNOSIS — Z3A37 37 weeks gestation of pregnancy: Secondary | ICD-10-CM

## 2020-08-23 DIAGNOSIS — O99214 Obesity complicating childbirth: Secondary | ICD-10-CM | POA: Diagnosis present

## 2020-08-23 DIAGNOSIS — O99824 Streptococcus B carrier state complicating childbirth: Secondary | ICD-10-CM | POA: Diagnosis present

## 2020-08-23 DIAGNOSIS — O99210 Obesity complicating pregnancy, unspecified trimester: Secondary | ICD-10-CM

## 2020-08-23 DIAGNOSIS — O9902 Anemia complicating childbirth: Secondary | ICD-10-CM | POA: Diagnosis present

## 2020-08-23 DIAGNOSIS — O34219 Maternal care for unspecified type scar from previous cesarean delivery: Secondary | ICD-10-CM | POA: Diagnosis not present

## 2020-08-23 DIAGNOSIS — Z20822 Contact with and (suspected) exposure to covid-19: Secondary | ICD-10-CM | POA: Diagnosis not present

## 2020-08-23 DIAGNOSIS — E669 Obesity, unspecified: Secondary | ICD-10-CM | POA: Diagnosis present

## 2020-08-23 DIAGNOSIS — Z9989 Dependence on other enabling machines and devices: Secondary | ICD-10-CM

## 2020-08-23 DIAGNOSIS — F419 Anxiety disorder, unspecified: Secondary | ICD-10-CM | POA: Diagnosis present

## 2020-08-23 DIAGNOSIS — Z98891 History of uterine scar from previous surgery: Secondary | ICD-10-CM | POA: Diagnosis not present

## 2020-08-23 DIAGNOSIS — O0990 Supervision of high risk pregnancy, unspecified, unspecified trimester: Secondary | ICD-10-CM

## 2020-08-23 DIAGNOSIS — G4733 Obstructive sleep apnea (adult) (pediatric): Secondary | ICD-10-CM | POA: Diagnosis not present

## 2020-08-23 DIAGNOSIS — D649 Anemia, unspecified: Secondary | ICD-10-CM | POA: Diagnosis present

## 2020-08-23 DIAGNOSIS — B951 Streptococcus, group B, as the cause of diseases classified elsewhere: Secondary | ICD-10-CM | POA: Diagnosis present

## 2020-08-23 DIAGNOSIS — Z9119 Patient's noncompliance with other medical treatment and regimen: Secondary | ICD-10-CM

## 2020-08-23 DIAGNOSIS — F32A Depression, unspecified: Secondary | ICD-10-CM | POA: Diagnosis present

## 2020-08-23 DIAGNOSIS — O99820 Streptococcus B carrier state complicating pregnancy: Secondary | ICD-10-CM | POA: Diagnosis not present

## 2020-08-23 DIAGNOSIS — Z8759 Personal history of other complications of pregnancy, childbirth and the puerperium: Secondary | ICD-10-CM

## 2020-08-23 DIAGNOSIS — O139 Gestational [pregnancy-induced] hypertension without significant proteinuria, unspecified trimester: Secondary | ICD-10-CM

## 2020-08-23 DIAGNOSIS — Z531 Procedure and treatment not carried out because of patient's decision for reasons of belief and group pressure: Secondary | ICD-10-CM

## 2020-08-23 DIAGNOSIS — Z8632 Personal history of gestational diabetes: Secondary | ICD-10-CM | POA: Diagnosis present

## 2020-08-23 DIAGNOSIS — O99354 Diseases of the nervous system complicating childbirth: Secondary | ICD-10-CM | POA: Diagnosis not present

## 2020-08-23 DIAGNOSIS — Z789 Other specified health status: Secondary | ICD-10-CM

## 2020-08-23 DIAGNOSIS — O134 Gestational [pregnancy-induced] hypertension without significant proteinuria, complicating childbirth: Secondary | ICD-10-CM | POA: Diagnosis present

## 2020-08-23 DIAGNOSIS — O0993 Supervision of high risk pregnancy, unspecified, third trimester: Secondary | ICD-10-CM

## 2020-08-23 DIAGNOSIS — O98819 Other maternal infectious and parasitic diseases complicating pregnancy, unspecified trimester: Secondary | ICD-10-CM | POA: Diagnosis present

## 2020-08-23 DIAGNOSIS — O99013 Anemia complicating pregnancy, third trimester: Secondary | ICD-10-CM | POA: Diagnosis present

## 2020-08-23 DIAGNOSIS — O34211 Maternal care for low transverse scar from previous cesarean delivery: Secondary | ICD-10-CM | POA: Diagnosis not present

## 2020-08-23 DIAGNOSIS — O24419 Gestational diabetes mellitus in pregnancy, unspecified control: Secondary | ICD-10-CM | POA: Diagnosis present

## 2020-08-23 LAB — GLUCOSE, CAPILLARY
Glucose-Capillary: 85 mg/dL (ref 70–99)
Glucose-Capillary: 84 mg/dL (ref 70–99)
Glucose-Capillary: 121 mg/dL — ABNORMAL HIGH (ref 70–99)
Glucose-Capillary: 99 mg/dL (ref 70–99)
Glucose-Capillary: 103 mg/dL — ABNORMAL HIGH (ref 70–99)
Glucose-Capillary: 91 mg/dL (ref 70–99)
Glucose-Capillary: 79 mg/dL (ref 70–99)

## 2020-08-23 LAB — RESP PANEL BY RT-PCR (FLU A&B, COVID) ARPGX2
Influenza A by PCR: NEGATIVE
Influenza B by PCR: NEGATIVE
SARS Coronavirus 2 by RT PCR: NEGATIVE

## 2020-08-23 LAB — CBC
HCT: 30.8 % — ABNORMAL LOW (ref 36.0–46.0)
Hemoglobin: 10.7 g/dL — ABNORMAL LOW (ref 12.0–15.0)
MCH: 30.7 pg (ref 26.0–34.0)
MCHC: 34.7 g/dL (ref 30.0–36.0)
MCV: 88.3 fL (ref 80.0–100.0)
Platelets: 252 10*3/uL (ref 150–400)
RBC: 3.49 MIL/uL — ABNORMAL LOW (ref 3.87–5.11)
RDW: 14.2 % (ref 11.5–15.5)
WBC: 5.4 10*3/uL (ref 4.0–10.5)
nRBC: 0 % (ref 0.0–0.2)

## 2020-08-23 LAB — ANTIBODY SCREEN: Antibody Screen: NEGATIVE

## 2020-08-23 LAB — NO BLOOD PRODUCTS

## 2020-08-23 LAB — ABO/RH: ABO/RH(D): O POS

## 2020-08-23 LAB — RPR: RPR Ser Ql: NONREACTIVE

## 2020-08-23 MED ORDER — PHENYLEPHRINE 40 MCG/ML (10ML) SYRINGE FOR IV PUSH (FOR BLOOD PRESSURE SUPPORT): 80.0000 ug | PREFILLED_SYRINGE | INTRAVENOUS | Status: DC | PRN

## 2020-08-23 MED ORDER — ACETAMINOPHEN 325 MG PO TABS: 650.0000 mg | ORAL_TABLET | ORAL | Status: DC | PRN

## 2020-08-23 MED ORDER — LIDOCAINE HCL (PF) 1 % IJ SOLN
INTRAMUSCULAR | Status: DC | PRN
  Administered 2020-08-23: 5 mL via EPIDURAL

## 2020-08-23 MED ORDER — FENTANYL CITRATE (PF) 100 MCG/2ML IJ SOLN
50.0000 ug | INTRAMUSCULAR | Status: DC | PRN
  Filled 2020-08-23: qty 2

## 2020-08-23 MED ORDER — BUTORPHANOL TARTRATE 1 MG/ML IJ SOLN
INTRAMUSCULAR | Status: AC
  Administered 2020-08-23: 1 mg
  Filled 2020-08-23: qty 2

## 2020-08-23 MED ORDER — PHENYLEPHRINE 40 MCG/ML (10ML) SYRINGE FOR IV PUSH (FOR BLOOD PRESSURE SUPPORT)
80.0000 ug | PREFILLED_SYRINGE | INTRAVENOUS | Status: DC | PRN
  Filled 2020-08-23: qty 10

## 2020-08-23 MED ORDER — OXYTOCIN-SODIUM CHLORIDE 30-0.9 UT/500ML-% IV SOLN
1.0000 m[IU]/min | INTRAVENOUS | Status: DC
  Administered 2020-08-23: 1 m[IU]/min via INTRAVENOUS
  Filled 2020-08-23: qty 500

## 2020-08-23 MED ORDER — BUTORPHANOL TARTRATE 1 MG/ML IJ SOLN
2.0000 mg | Freq: Once | INTRAMUSCULAR | Status: AC
Start: 2020-08-23 — End: 2020-08-23
  Administered 2020-08-23: 2 mg via INTRAVENOUS

## 2020-08-23 MED ORDER — DIPHENHYDRAMINE HCL 50 MG/ML IJ SOLN: 12.5000 mg | INTRAMUSCULAR | Status: DC | PRN

## 2020-08-23 MED ORDER — PROMETHAZINE HCL 25 MG/ML IJ SOLN
12.5000 mg | Freq: Once | INTRAMUSCULAR | Status: AC
  Administered 2020-08-23: 12.5 mg via INTRAVENOUS
  Filled 2020-08-23: qty 1

## 2020-08-23 MED ORDER — ONDANSETRON HCL 4 MG/2ML IJ SOLN: 4.0000 mg | Freq: Four times a day (QID) | INTRAMUSCULAR | Status: DC | PRN

## 2020-08-23 MED ORDER — SODIUM CHLORIDE 0.9 % IV SOLN
5.0000 10*6.[IU] | Freq: Once | INTRAVENOUS | Status: AC
  Administered 2020-08-23: 5 10*6.[IU] via INTRAVENOUS
  Filled 2020-08-23: qty 5

## 2020-08-23 MED ORDER — EPHEDRINE 5 MG/ML INJ: 10.0000 mg | INTRAVENOUS | Status: DC | PRN

## 2020-08-23 MED ORDER — PENICILLIN G POT IN DEXTROSE 60000 UNIT/ML IV SOLN
3.0000 10*6.[IU] | INTRAVENOUS | Status: DC
  Administered 2020-08-23 – 2020-08-24 (×6): 3 10*6.[IU] via INTRAVENOUS
  Filled 2020-08-23 (×6): qty 50

## 2020-08-23 MED ORDER — OXYTOCIN-SODIUM CHLORIDE 30-0.9 UT/500ML-% IV SOLN: 2.5000 [IU]/h | INTRAVENOUS | Status: DC

## 2020-08-23 MED ORDER — FAMOTIDINE IN NACL 20-0.9 MG/50ML-% IV SOLN
20.0000 mg | Freq: Once | INTRAVENOUS | Status: AC
  Administered 2020-08-23: 20 mg via INTRAVENOUS
  Filled 2020-08-23: qty 50

## 2020-08-23 MED ORDER — BUTORPHANOL TARTRATE 1 MG/ML IJ SOLN: 2.0000 mg | Freq: Once | INTRAMUSCULAR | Status: DC

## 2020-08-23 MED ORDER — LACTATED RINGERS IV SOLN
500.0000 mL | INTRAVENOUS | Status: DC | PRN
  Administered 2020-08-24: 1000 mL via INTRAVENOUS

## 2020-08-23 MED ORDER — LACTATED RINGERS IV SOLN: INTRAVENOUS | Status: DC

## 2020-08-23 MED ORDER — TERBUTALINE SULFATE 1 MG/ML IJ SOLN
0.2500 mg | Freq: Once | INTRAMUSCULAR | Status: DC | PRN
Start: 2020-08-23 — End: 2020-08-24

## 2020-08-23 MED ORDER — LACTATED RINGERS IV SOLN
500.0000 mL | Freq: Once | INTRAVENOUS | Status: AC
  Administered 2020-08-23: 500 mL via INTRAVENOUS

## 2020-08-23 MED ORDER — FENTANYL CITRATE (PF) 100 MCG/2ML IJ SOLN
100.0000 ug | INTRAMUSCULAR | Status: DC | PRN
  Administered 2020-08-23: 100 ug via INTRAVENOUS
  Filled 2020-08-23: qty 2

## 2020-08-23 MED ORDER — SOD CITRATE-CITRIC ACID 500-334 MG/5ML PO SOLN: 30.0000 mL | ORAL | Status: DC | PRN

## 2020-08-23 MED ORDER — CALCIUM CARBONATE ANTACID 500 MG PO CHEW: 2.0000 | CHEWABLE_TABLET | Freq: Four times a day (QID) | ORAL | Status: DC | PRN

## 2020-08-23 MED ORDER — FENTANYL CITRATE (PF) 100 MCG/2ML IJ SOLN
100.0000 ug | INTRAMUSCULAR | Status: AC | PRN
  Administered 2020-08-23 (×3): 100 ug via INTRAVENOUS
  Filled 2020-08-23 (×2): qty 2

## 2020-08-23 MED ORDER — OXYTOCIN-SODIUM CHLORIDE 30-0.9 UT/500ML-% IV SOLN
1.0000 m[IU]/min | INTRAVENOUS | Status: DC
  Administered 2020-08-23: 6 m[IU]/min via INTRAVENOUS
  Administered 2020-08-24: 20 m[IU]/min via INTRAVENOUS
  Filled 2020-08-23: qty 500

## 2020-08-23 MED ORDER — BUTORPHANOL TARTRATE 1 MG/ML IJ SOLN
1.0000 mg | Freq: Once | INTRAMUSCULAR | Status: DC
  Filled 2020-08-23: qty 1

## 2020-08-23 MED ORDER — OXYTOCIN BOLUS FROM INFUSION
333.0000 mL | Freq: Once | INTRAVENOUS | Status: AC
  Administered 2020-08-24: 333 mL via INTRAVENOUS

## 2020-08-23 MED ORDER — PROMETHAZINE HCL 25 MG/ML IJ SOLN: 12.5000 mg | Freq: Once | INTRAMUSCULAR | Status: DC

## 2020-08-23 MED ORDER — BUTORPHANOL TARTRATE 1 MG/ML IJ SOLN: 1.0000 mg | Freq: Once | INTRAMUSCULAR | Status: DC

## 2020-08-23 MED ORDER — BUTORPHANOL TARTRATE 1 MG/ML IJ SOLN
INTRAMUSCULAR | Status: AC
  Filled 2020-08-23: qty 1

## 2020-08-23 MED ORDER — FENTANYL-BUPIVACAINE-NACL 0.5-0.125-0.9 MG/250ML-% EP SOLN
12.0000 mL/h | EPIDURAL | Status: DC | PRN
  Filled 2020-08-23 (×2): qty 250

## 2020-08-23 MED ORDER — FENTANYL-BUPIVACAINE-NACL 0.5-0.125-0.9 MG/250ML-% EP SOLN
EPIDURAL | Status: DC | PRN
  Administered 2020-08-23: 12 mL/h via EPIDURAL

## 2020-08-23 MED ORDER — LIDOCAINE HCL (PF) 1 % IJ SOLN
30.0000 mL | INTRAMUSCULAR | Status: AC | PRN
  Administered 2020-08-24: 30 mL via SUBCUTANEOUS
  Filled 2020-08-23: qty 30

## 2020-08-23 NOTE — Progress Notes (Addendum)
Colleen Fields is a 30 y.o. G3P2002 at [redacted]w[redacted]d admitted for IOL for A1GDM with poor adherence.   Subjective: Notified by nurse the FB out.  Strip reviewed.  Objective: BP 120/68   Pulse 84   Temp 98.5 F (36.9 C) (Oral)   Resp 18   Ht 4\' 11"  (1.499 m)   Wt 112.4 kg   LMP 12/07/2019 (Exact Date)   BMI 50.05 kg/m  No intake/output data recorded.  FHT:  FHR: 150 bpm, variability: moderate, accelerations: Absent,  decelerations: Absent UC: Every 2-5 minutes  SVE:   Dilation: 4 Effacement (%): 50 Station: -3 Exam by:: lee  Pitocin @ 8 mu/min  Labs: Lab Results  Component Value Date   WBC 5.4 08/23/2020   HGB 10.7 (L) 08/23/2020   HCT 30.8 (L) 08/23/2020   MCV 88.3 08/23/2020   PLT 252 08/23/2020    Assessment / Plan: Colleen Fields is a 30 y.o. G3P2002 at [redacted]w[redacted]d admitted for IOL for A1GDM with poor adherence.   #Labor: Attempted Foley on admission, too painful so pt declined and started on pitocin.  Not feeling intense contractions and cervix unchanged on pitocin.  Reattempted and successfully placed FB at 1230.  FB out at 1430.  Titrate pit up as able.   #A1GDM with poor adherence: Checking BG q4h for now, then q2h when in active labor.  BG 84-121.   #Pain: Per request #FWB: Cat I #ID: GBS positive, PCN  #MOF: Breast #MOC: Unsure, counseled #Circ: No  Colleen Fields [redacted]w[redacted]d, MD 08/23/2020, 06:52 PM

## 2020-08-23 NOTE — Progress Notes (Signed)
[place] Silveria is a 30 y.o. G3P2002 at [redacted]w[redacted]d admitted for IOL for A1GDM with poor adherence.   Subjective: Feeling a bit more comfortable and calm now after getting some rest.  Husband has arrived.  Just received a dose of Stadol 1 mg in anticipation of FB insertion.  She is agreeable to reattempting FB.    Objective: BP 126/65   Pulse 92   Temp 98.5 F (36.9 C) (Oral)   Resp 18   Ht 4\' 11"  (1.499 m)   Wt 112.4 kg   LMP 12/07/2019 (Exact Date)   BMI 50.05 kg/m  No intake/output data recorded.  FHT:  FHR: 150 bpm, variability: moderate, accelerations: Absent,  decelerations: Absent UC: Every 2-4 minutes  SVE:   Dilation: 1 Effacement (%): 40 Station: -3 Exam by:: [email or web address], MD  Pitocin @ 8 mu/min  Labs: Lab Results  Component Value Date   WBC 5.4 08/23/2020   HGB 10.7 (L) 08/23/2020   HCT 30.8 (L) 08/23/2020   MCV 88.3 08/23/2020   PLT 252 08/23/2020    Assessment / Plan: Haifa Hatton is a 30 y.o. G3P2002 at [redacted]w[redacted]d admitted for IOL for A1GDM with poor adherence.   #Labor: Attempted Foley on admission, too painful so pt declined and started on pitocin.  Not feeling intense contractions and cervix unchanged on pitocin.  Reattempted and successfully placed FB this check with 60 cc.  Continue pit, but no further titration until FB out.   #A1GDM with poor adherence: BG 85>121>99.  Checking q4h for now, then q2h when in active labor  #Pain: Per request #FWB: Cat I #ID: GBS positive, PCN  #MOF: Breast #MOC: Unsure, counseled #Circ: No  Maricela Kawahara [redacted]w[redacted]d, MD 08/23/2020, 12:29 PM

## 2020-08-23 NOTE — Anesthesia Preprocedure Evaluation (Addendum)
Anesthesia Evaluation  Patient identified by MRN, date of birth, ID band Patient awake    Reviewed: Allergy & Precautions, NPO status , Patient's Chart, lab work & pertinent test results  Airway Mallampati: II  TM Distance: >3 FB Neck ROM: Full    Dental no notable dental hx. (+) Teeth Intact, Dental Advisory Given   Pulmonary    Pulmonary exam normal breath sounds clear to auscultation       Cardiovascular Exercise Tolerance: Good negative cardio ROS Normal cardiovascular exam Rhythm:Regular Rate:Normal     Neuro/Psych  Headaches, Anxiety    GI/Hepatic negative GI ROS, Neg liver ROS,   Endo/Other  diabetes, Gestational  Renal/GU negative Renal ROS     Musculoskeletal   Abdominal   Peds  Hematology  (+) REFUSES BLOOD PRODUCTS, Lab Results      Component                Value               Date                      WBC                      5.4                 08/23/2020                HGB                      10.7 (L)            08/23/2020                HCT                      30.8 (L)            08/23/2020                MCV                      88.3                08/23/2020                PLT                      252                 08/23/2020              Anesthesia Other Findings   Reproductive/Obstetrics (+) Pregnancy                            Anesthesia Physical Anesthesia Plan  ASA: III  Anesthesia Plan: Epidural   Post-op Pain Management:    Induction:   PONV Risk Score and Plan:   Airway Management Planned:   Additional Equipment:   Intra-op Plan:   Post-operative Plan:   Informed Consent: I have reviewed the patients History and Physical, chart, labs and discussed the procedure including the risks, benefits and alternatives for the proposed anesthesia with the patient or authorized representative who has indicated his/her understanding and acceptance.        Plan Discussed with:   Anesthesia Plan Comments: (37.1 wk G3P2 w  gDM and inc BMI for LEA)        Anesthesia Quick Evaluation

## 2020-08-23 NOTE — Progress Notes (Signed)
Laurana Magistro is a 30 y.o. G3P2002 at [redacted]w[redacted]d admitted for IOL for A1GDM with poor adherence.   Subjective: Feels tired and overwhelmed.  Worried about her husband who was in a car accident yesterday and has not been able to come to the hospital yet.  Feeling some contractions.  Feels anxious.   Objective: BP 125/65   Pulse 75   Temp 98.5 F (36.9 C) (Oral)   Resp 18   Ht 4\' 11"  (1.499 m)   Wt 112.4 kg   LMP 12/07/2019 (Exact Date)   BMI 50.05 kg/m  No intake/output data recorded.  FHT:  FHR: 140 bpm, variability: moderate,  accelerations:  Absent,  decelerations:  Absent UC:   irregular, every 2-6 minutes  SVE:   Dilation: 1 Effacement (%): 40 Station: -3 Exam by:: CO'Rear  Pitocin @ 8 mu/min  Labs: Lab Results  Component Value Date   WBC 5.4 08/23/2020   HGB 10.7 (L) 08/23/2020   HCT 30.8 (L) 08/23/2020   MCV 88.3 08/23/2020   PLT 252 08/23/2020    Assessment / Plan: Shekelia Boutin is a 30 y.o. G3P2002 at [redacted]w[redacted]d admitted for IOL for A1GDM with poor adherence.   #Labor: Attempted Foley on admittion, too painful so pt declined and started on pitocin.  Discussed reattempting FB, given unfavorable cervix.  For now, she feels too tired and overwhelmed to make a decision on her own.  We discussed allowing her to sleep for a couple hours and rechecking at 12 PM.  If cervix still unfavorable, will re-attempt FB placement.  If favorable, can continue with pit.  For now, will not further titrate pit.  She is agreeable to this plan. #A1GDM with poor adherence: BG 85>121>99.  Checking q2h. #Pain: Per request #FWB: Cat 1 #ID: GBS positive, PCN  #MOF: Breast #MOC: Unsure, counseled #Circ: No  Marlyn Rabine [redacted]w[redacted]d, MD 08/23/2020, 09:45 AM

## 2020-08-23 NOTE — Progress Notes (Signed)
Colleen Fields is a 30 y.o. G3P2002 at [redacted]w[redacted]d by LMP admitted for induction of labor due to Gestational diabetes with poor adherence.  Subjective: Patient sitting in chair, recently received fentanyl for pain relief. Patient rated pain Five (5) out of ten (10).   Denies breathing difficulty, respiratory distress, chest pain, vaginal bleeding, and leg pain.  Objective: BP 136/68   Pulse 96   Temp 98.1 F (36.7 C) (Oral)   Resp 16   Ht 4\' 11"  (1.499 m)   Wt 112.4 kg   LMP 12/07/2019 (Exact Date)   BMI 50.05 kg/m  No intake/output data recorded. No intake/output data recorded.  FHT:  FHR: 155 bpm, variability: moderate,  accelerations:  Present,  decelerations:  Absent UC:   irregular, every 2-7 minutes SVE:   Dilation: 4 Effacement (%): 50 Station: -3 Exam by:: lee  Pitocin @ 14 mu/min Labs: Lab Results  Component Value Date   WBC 5.4 08/23/2020   HGB 10.7 (L) 08/23/2020   HCT 30.8 (L) 08/23/2020   MCV 88.3 08/23/2020   PLT 252 08/23/2020    Assessment / Plan: Induction of labor due to gestational diabetes,  progressing well on pitocin  Labor: Progressing on Pitocin, will continue to increase then AROM Preeclampsia:  no signs or symptoms of toxicity Fetal Wellbeing:  Category I Pain Control:  IV pain meds, waiting for husband to return and may request epidural later. I/D:  n/a Anticipated MOD:  NSVD   Continue to monitor blood glucose  10/21/2020, SN[facility] [employer] 08/23/2020, 10:09 PM

## 2020-08-23 NOTE — Anesthesia Procedure Notes (Signed)

## 2020-08-23 NOTE — H&P (Signed)
Ardine Iacovelli is a 30 y.o. female [phone] with IUP at [redacted]w[redacted]d presenting for IOL for non compliance with GDM.Marland [facility] 15 wks  Prenatal History/Complications:  CS for NRFHT 2011 VBAC of 3206 gm female in 2019   Past Medical History: Past Medical History:  Diagnosis Date  . Chest pain   . Complication of anesthesia    problems with epidural anesthesia during cs  . Depression   . Gestational diabetes    insulin  . Migraine   . Obesity 07/22/2017  . Palpitations   . Tachycardia     Past Surgical History: Past Surgical History:  Procedure Laterality Date  . CESAREAN SECTION    . WISDOM TOOTH EXTRACTION      Obstetrical History: OB History    Gravida  3   Para  2   Term  2   Preterm  0   AB      Living  2     SAB      IAB      Ectopic      Multiple  0   Live Births  2           Social History: Social History   Socioeconomic History  . Marital status: Married    Spouse name: Not on file  . Number of children: Not on file  . Years of education: Not on file  . Highest education level: Not on file  Occupational History  . Not on file  Tobacco Use  . Smoking status: Never Smoker  . Smokeless tobacco: Never Used  Vaping Use  . Vaping Use: Never used  Substance and Sexual Activity  . Alcohol use: No  . Drug use: No    Types: Marijuana    Comment: "hasn't smoked in forever"  . Sexual activity: Yes    Birth control/protection: None  Other Topics Concern  . Not on file  Social History Narrative  . Not on file   Social Determinants of Health   Financial Resource Strain: Not on file  Food Insecurity: No Food Insecurity  . Worried About Charity fundraiser in the Last Year: Never true  . Ran Out of Food in the Last Year: Never true  Transportation Needs: No Transportation Needs  . Lack of Transportation (Medical): No  . Lack of Transportation (Non-Medical): No  Physical Activity: Not on file  Stress: Not on file  Social  Connections: Not on file    Family History: Family History  Problem Relation Age of Onset  . Hypertension Mother   . Bipolar disorder Father   . Schizophrenia Father     Allergies: No Known Allergies  Medications Prior to Admission  Medication Sig Dispense Refill Last Dose  . Accu-Chek Softclix Lancets lancets 100 each by Other route 4 (four) times daily. Use as instructed 100 each 12   . aspirin 81 MG chewable tablet Chew 1 tablet (81 mg total) by mouth daily. (Patient not taking: No sig reported) 30 tablet 10   . Blood Glucose Monitoring Suppl (ACCU-CHEK GUIDE) w/Device KIT 1 Device by Does not apply route 4 (four) times daily. 1 kit 0   . Elastic Bandages & Supports (COMFORT FIT MATERNITY SUPP LG) MISC Wear daily when ambulating 1 each 0   . ferrous sulfate (FERROUSUL) 325 (65 FE) MG tablet Take 1 tablet (325 mg total) by mouth every other day. 30 tablet 3   . glucose blood test strip Use as instructed 4  times daily 100 each 12   . Prenatal Vit-Fe Fumarate-FA (PRENATAL MULTIVITAMIN) TABS tablet Take 1 tablet by mouth daily at 12 noon.           Review of Systems   Constitutional: Negative for fever and chills Eyes: Negative for visual disturbances Respiratory: Negative for shortness of breath, dyspnea Cardiovascular: Negative for chest pain or palpitations  Gastrointestinal: Negative for abdominal pain, vomiting, diarrhea and constipation.   Genitourinary: Negative for dysuria and urgency Musculoskeletal: Negative for back pain, joint pain, myalgias  Neurological: Negative for dizziness and headaches      Last menstrual period 12/07/2019, currently breastfeeding. General appearance: alert, cooperative and no distress Lungs: normal respiratory effort Heart: regular rate and rhythm Abdomen: soft, non-tender; bowel sounds normal Extremities: Homans sign is negative, no sign of DVT DTR's 2+ Presentation: cephalic Fetal monitoring  Baseline: 150 bpm, Variability:  Good {> 6 bpm), Accelerations: Reactive and Decelerations: Absent Uterine activity  None      Prenatal labs: ABO, Rh: O/Positive/-- (09/09 1507) Antibody: Negative (09/09 1507) Rubella: immune RPR: Non Reactive (12/15 0833)  HBsAg: Negative (09/09 1507)  HIV: Non Reactive (12/15 0833)  GBS: Positive/-- (02/02 1655)  EFW 3249gm (81%)  Nursing Staff Provider  Office Location  [facility] Dating  Early ultrasound  Language  english Anatomy US  normal  Flu Vaccine  declined Genetic Screen  declined all genetic testing     TDaP Vaccine   declined Hgb A1C or  GTT Early  Third trimester Abnml -> GDM  COVID Vaccine declined   LAB RESULTS     Blood Type O/Positive/-- (09/09 1507)   Feeding Plan breast Antibody Negative (09/09 1507)  Contraception  LARC (not nexplanon) or NUVa ring Rubella 2.98 (09/09 1507)  Circumcision Yes, outpatient RPR Non Reactive (09/09 1507)   Pediatrician  tapm HBsAg Negative (09/09 1507)   Support Person mother HCVAb Negative  Prenatal Classes  36 wk too late HIV Non Reactive (09/09 1507)     BTL Consent  GBS     VBAC Consent 06/26/20 Pap normal    Hgb Electro    BP Cuff   CF     SMA     Waterbirth  $RemoveBef'[ ]'bjvJfyBzBL$  Class $Remo'[ ]'yvihX$  Consent $Remove'[ ]'qkAkcOa$  CNM visit    Induction  $RemoveBe'[ ]'FdpsYRHBK$  Orders Entered $RemoveBefore'[ ]'cgAYIMvvxQZqq$ Foley Y/N    Prenatal Transfer Tool  Maternal Diabetes: gestational, never checked BS so ? A1 vs A2 Genetic Screening: Declined Maternal Ultrasounds/Referrals: Normal Fetal Ultrasounds or other Referrals:  None Maternal Substance Abuse:  No Significant Maternal Medications:  None Significant Maternal Lab Results: Group B Strep positive    No results found for this or any previous visit (from the past 24 hour(s)).  Assessment: Leeandra Ellerson is a 30 y.o. [phone] with an IUP at [redacted]w[redacted]d presenting for IOL for non compliant w/GDM.  Plan: #Labor: Attempted Foley, too painful so pt declined->pitocin #Pain:  Per request #FWB Cat 1 #ID: GBS: PCN  #MOF:  breast #MOC: unsure,  counseled #Circ: no   Christin Fudge 08/23/2020, 12:09 AM

## 2020-08-23 NOTE — Progress Notes (Signed)
Colleen Fields is a 30 y.o. G3P2002 at [redacted]w[redacted]d admitted for IOL for A1GDM with poor adherence.   Subjective: Strip reviewed  Objective: BP 120/61   Pulse 86   Temp 98.5 F (36.9 C) (Oral)   Resp 18   Ht 4\' 11"  (1.499 m)   Wt 112.4 kg   LMP 12/07/2019 (Exact Date)   BMI 50.05 kg/m  No intake/output data recorded.  FHT:  FHR: 140 bpm, variability: moderate, accelerations: Present,  decelerations: Absent UC: Every 2-7 minutes  SVE:   Dilation: 1 Effacement (%): 40 Station: -3 Exam by:: [email or web address], MD  Pitocin @ 8 mu/min  Labs: Lab Results  Component Value Date   WBC 5.4 08/23/2020   HGB 10.7 (L) 08/23/2020   HCT 30.8 (L) 08/23/2020   MCV 88.3 08/23/2020   PLT 252 08/23/2020    Assessment / Plan: Colleen Fields is a 30 y.o. G3P2002 at [redacted]w[redacted]d admitted for IOL for A1GDM with poor adherence.   #Labor: Attempted Foley on admission, too painful so pt declined and started on pitocin.  Not feeling intense contractions and cervix unchanged on pitocin.  Reattempted and successfully placed FB at 1230.  Continue pit, but no further titration until FB out.   #A1GDM with poor adherence: BG 85>121>99>103.  Checking q4h for now, then q2h when in active labor. #Pain: Per request #FWB: Cat I #ID: GBS positive, PCN  #MOF: Breast #MOC: Unsure, counseled #Circ: No  Mirka Barbone [redacted]w[redacted]d, MD 08/23/2020, 04:48 PM

## 2020-08-23 NOTE — Progress Notes (Signed)
  Patient Vitals for the past 4 hrs:  BP Pulse  08/23/20 0705 (!) 102/49 90  08/23/20 0630 112/67 96  08/23/20 0600 (!) 105/55 (!) 102  08/23/20 0530 (!) 116/49 97  08/23/20 0500 (!) 103/38 (!) 101  08/23/20 0435 (!) 109/48 90   Blood sugars 85 @ 0430 and 121 @ 0730.    Pt was very emotional last night about being alone (husband just got a new job, was in a minor MVA, and plans to come up sometime today) and delayed all interventions until about 0430.  Pitocin now at 6 mu/min, no contractions.  FHR Cat 1. Will continue to increase pitocin.  Will check BS q 2 hours now d/t elevated one this am.

## 2020-08-24 DIAGNOSIS — O24420 Gestational diabetes mellitus in childbirth, diet controlled: Secondary | ICD-10-CM

## 2020-08-24 DIAGNOSIS — Z789 Other specified health status: Secondary | ICD-10-CM

## 2020-08-24 DIAGNOSIS — Z8759 Personal history of other complications of pregnancy, childbirth and the puerperium: Secondary | ICD-10-CM

## 2020-08-24 DIAGNOSIS — O34211 Maternal care for low transverse scar from previous cesarean delivery: Secondary | ICD-10-CM

## 2020-08-24 DIAGNOSIS — Z531 Procedure and treatment not carried out because of patient's decision for reasons of belief and group pressure: Secondary | ICD-10-CM

## 2020-08-24 DIAGNOSIS — O139 Gestational [pregnancy-induced] hypertension without significant proteinuria, unspecified trimester: Secondary | ICD-10-CM

## 2020-08-24 DIAGNOSIS — O99820 Streptococcus B carrier state complicating pregnancy: Secondary | ICD-10-CM

## 2020-08-24 DIAGNOSIS — Z3A37 37 weeks gestation of pregnancy: Secondary | ICD-10-CM

## 2020-08-24 LAB — GLUCOSE, CAPILLARY
Glucose-Capillary: 71 mg/dL (ref 70–99)
Glucose-Capillary: 94 mg/dL (ref 70–99)
Glucose-Capillary: 75 mg/dL (ref 70–99)
Glucose-Capillary: 95 mg/dL (ref 70–99)
Glucose-Capillary: 98 mg/dL (ref 70–99)

## 2020-08-24 MED ORDER — TETANUS-DIPHTH-ACELL PERTUSSIS 5-2.5-18.5 LF-MCG/0.5 IM SUSY: 0.5000 mL | PREFILLED_SYRINGE | Freq: Once | INTRAMUSCULAR | Status: DC

## 2020-08-24 MED ORDER — DIPHENHYDRAMINE HCL 25 MG PO CAPS: 25.0000 mg | ORAL_CAPSULE | Freq: Four times a day (QID) | ORAL | Status: DC | PRN

## 2020-08-24 MED ORDER — ACETAMINOPHEN 325 MG PO TABS
650.0000 mg | ORAL_TABLET | ORAL | Status: DC
  Administered 2020-08-24 – 2020-08-26 (×7): 650 mg via ORAL
  Filled 2020-08-24 (×8): qty 2

## 2020-08-24 MED ORDER — PRENATAL MULTIVITAMIN CH
1.0000 | ORAL_TABLET | Freq: Every day | ORAL | Status: DC
  Administered 2020-08-25 – 2020-08-26 (×2): 1 via ORAL
  Filled 2020-08-24 (×2): qty 1

## 2020-08-24 MED ORDER — IBUPROFEN 600 MG PO TABS
600.0000 mg | ORAL_TABLET | Freq: Four times a day (QID) | ORAL | Status: DC
  Administered 2020-08-24 – 2020-08-26 (×7): 600 mg via ORAL
  Filled 2020-08-24 (×7): qty 1

## 2020-08-24 MED ORDER — WITCH HAZEL-GLYCERIN EX PADS: 1.0000 "application " | MEDICATED_PAD | CUTANEOUS | Status: DC | PRN

## 2020-08-24 MED ORDER — DIBUCAINE (PERIANAL) 1 % EX OINT: 1.0000 "application " | TOPICAL_OINTMENT | CUTANEOUS | Status: DC | PRN

## 2020-08-24 MED ORDER — ONDANSETRON HCL 4 MG/2ML IJ SOLN: 4.0000 mg | INTRAMUSCULAR | Status: DC | PRN

## 2020-08-24 MED ORDER — BENZOCAINE-MENTHOL 20-0.5 % EX AERO
1.0000 "application " | INHALATION_SPRAY | CUTANEOUS | Status: DC | PRN
  Administered 2020-08-25: 1 via TOPICAL
  Filled 2020-08-24: qty 56

## 2020-08-24 MED ORDER — DIPHENHYDRAMINE HCL 25 MG PO CAPS
50.0000 mg | ORAL_CAPSULE | Freq: Once | ORAL | Status: AC
  Administered 2020-08-24: 50 mg via ORAL
  Filled 2020-08-24: qty 2

## 2020-08-24 MED ORDER — ONDANSETRON HCL 4 MG PO TABS: 4.0000 mg | ORAL_TABLET | ORAL | Status: DC | PRN

## 2020-08-24 MED ORDER — SENNOSIDES-DOCUSATE SODIUM 8.6-50 MG PO TABS
2.0000 | ORAL_TABLET | Freq: Every day | ORAL | Status: DC
  Administered 2020-08-25 – 2020-08-26 (×2): 2 via ORAL
  Filled 2020-08-24 (×2): qty 2

## 2020-08-24 MED ORDER — SIMETHICONE 80 MG PO CHEW: 80.0000 mg | CHEWABLE_TABLET | ORAL | Status: DC | PRN

## 2020-08-24 MED ORDER — FAMOTIDINE IN NACL 20-0.9 MG/50ML-% IV SOLN
20.0000 mg | Freq: Once | INTRAVENOUS | Status: AC
  Administered 2020-08-24: 20 mg via INTRAVENOUS
  Filled 2020-08-24: qty 50

## 2020-08-24 MED ORDER — COCONUT OIL OIL: 1.0000 "application " | TOPICAL_OIL | Status: DC | PRN

## 2020-08-24 NOTE — Discharge Instructions (Signed)
-take tylenol 1000 mg every 6 hours as needed for pain, alternate with ibuprofen 600 mg every 6 hours -drink plenty of water to help with breastfeeding -continue prenatal vitamins while you are breastfeeding -take iron pills every other day with vitamin c, this will help healing as well as breast feeding -think about birth control options-->bedisider.org is a great website! You can get any form of birth control from the health department for free   Postpartum Care After Vaginal Delivery The following information offers guidance about how to care for yourself from the time you deliver your baby to 6-12 weeks after delivery (postpartum period). If you have problems or questions, contact your health care provider for more specific instructions. Follow these instructions at home: Vaginal bleeding  It is normal to have vaginal bleeding (lochia) after delivery. Wear a sanitary pad for bleeding and discharge. ? During the first week after delivery, the amount and appearance of lochia is often similar to a menstrual period. ? Over the next few weeks, it will gradually decrease to a dry, yellow-brown discharge. ? For most women, lochia stops completely by 4-6 weeks after delivery, but can vary.  Change your sanitary pads frequently. Watch for any changes in your flow, such as: ? A sudden increase in volume. ? A change in color. ? Large blood clots.  If you pass a blood clot from your vagina, save it and call your health care provider. Do not flush blood clots down the toilet before talking with your health care provider.  Do not use tampons or douches until your health care provider approves.  If you are not breastfeeding, your period should return 6-8 weeks after delivery. If you are feeding your baby breast milk only, your period may not return until you stop breastfeeding. Perineal care  Keep the area between the vagina and the anus (perineum) clean and dry. Use medicated pads and  pain-relieving sprays and creams as directed.  If you had a surgical cut in the perineum (episiotomy) or a tear, check the area for signs of infection until you are healed. Check for: ? More redness, swelling, or pain. ? Fluid or blood coming from the cut or tear. ? Warmth. ? Pus or a bad smell.  You may be given a squirt bottle to use instead of wiping to clean the perineum area after you use the bathroom. Pat the area gently to dry it.  To relieve pain caused by an episiotomy, a tear, or swollen veins in the anus (hemorrhoids), take a warm sitz bath 2-3 times a day. In a sitz bath, the warm water should only come up to your hips and cover your buttocks.   Breast care  In the first few days after delivery, your breasts may feel heavy, full, and uncomfortable (breast engorgement). Milk may also leak from your breasts. Ask your health care provider about ways to help relieve the discomfort.  If you are breastfeeding: ? Wear a bra that supports your breasts and fits well. Use breast pads to absorb milk that leaks. ? Keep your nipples clean and dry. Apply creams and ointments as told. ? You may have uterine contractions every time you breastfeed for up to several weeks after delivery. This helps your uterus return to its normal size. ? If you have any problems with breastfeeding, notify your health care provider or lactation consultant.  If you are not breastfeeding: ? Avoid touching your breasts. Do not squeeze out (express) milk. Doing this can make your   breasts produce more milk. ? Wear a good-fitting bra and use cold packs to help with swelling. Intimacy and sexuality  Ask your health care provider when you can engage in sexual activity. This may depend upon: ? Your risk of infection. ? How fast you are healing. ? Your comfort and desire to engage in sexual activity.  You are able to get pregnant after delivery, even if you have not had your period. Talk with your health care provider  about methods of birth control (contraception) or family planning if you desire future pregnancies. Medicines  Take over-the-counter and prescription medicines only as told by your health care provider.  Take an over-the-counter stool softener to help ease bowel movements as told by your health care provider.  If you were prescribed an antibiotic medicine, take it as told by your health care provider. Do not stop taking the antibiotic even if you start to feel better.  Review all previous and current prescriptions to check for possible transfer into breast milk. Activity  Gradually return to your normal activities as told by your health care provider.  Rest as much as possible. Nap while your baby is sleeping. Eating and drinking  Drink enough fluid to keep your urine pale yellow.  To help prevent or relieve constipation, eat high-fiber foods every day.  Choose healthy eating to support breastfeeding or weight loss goals.  Take your prenatal vitamins until your health care provider tells you to stop.   General tips/recommendations  Do not use any products that contain nicotine or tobacco. These products include cigarettes, chewing tobacco, and vaping devices, such as e-cigarettes. If you need help quitting, ask your health care provider.  Do not drink alcohol, especially if you are breastfeeding.  Do not take medications or drugs that are not prescribed to you, especially if you are breastfeeding.  Visit your health care provider for a postpartum checkup within the first 3-6 weeks after delivery.  Complete a comprehensive postpartum visit no later than 12 weeks after delivery.  Keep all follow-up visits for you and your baby. Contact a health care provider if:  You feel unusually sad or worried.  Your breasts become red, painful, or hard.  You have a fever or other signs of an infection.  You have bleeding that is soaking through one pad an hour or you have blood  clots.  You have a severe headache that doesn't go away or you have vision changes.  You have nausea and vomiting and are unable to eat or drink anything for 24 hours. Get help right away if:  You have chest pain or difficulty breathing.  You have sudden, severe leg pain.  You faint or have a seizure.  You have thoughts about hurting yourself or your baby. If you ever feel like you may hurt yourself or others, or have thoughts about taking your own life, get help right away. Go to your nearest emergency department or:  Call your local emergency services (911 in the [place]  The National Suicide Prevention Lifeline at 1-800-273-8255. This suicide crisis helpline is open 24 hours a day.  Text the Crisis Text Line at 741741 (in the [place] Summary  The period of time after you deliver your newborn up to 6-12 weeks after delivery is called the postpartum period.  Keep all follow-up visits for you and your baby.  Review all previous and current prescriptions to check for possible transfer into breast milk.  Contact a health care provider if you feel   unusually sad or worried during the postpartum period. This information is not intended to replace advice given to you by your health care provider. Make sure you discuss any questions you have with your health care provider. Document Revised: 03/14/2020 Document Reviewed: 03/14/2020 Elsevier Patient Education  2021 Elsevier Inc.  

## 2020-08-24 NOTE — Lactation Note (Signed)
This note was copied from a baby's chart. Lactation Consultation Note  Patient Name: Boy Constanza Mincy Today's Date: 08/24/2020   Age:31 hours  Visited with baby boy Jackelyn Hoehn and parents in labor and delivery. Josiah STS with mom.  Mom breastfed her first who is now 43 years old close to 2 years, her second that is now 31 years old for close to 1 year.  Mom reports no challenges. Mom is an experienced breastfeeding mom.  However feels numb right now and would like assistance with breastfeeding.  LC assist on left breast.  Mom not comfortable so LCbroke suction and took him off the breast after about 5 minutes.  Mom sat up more.  Assisted with latching him to right breast in cradle hold.  Once josiah got latched and mom reported comfort dad took over.  Left family breastfeeding.  Mom GDM.  Urged to feed on cue and 8-12 or more times day and try to wake up to feed every few hours if he doesn't cue.  Let paretns know lactation would follow up with them later.   Maternal Data    Feeding    LATCH Score                    Lactation Tools Discussed/Used    Interventions    Discharge    Consult Status      Makila Colombe Michaelle Copas 08/24/2020, 4:20 PM

## 2020-08-24 NOTE — Progress Notes (Signed)
Colleen Fields is a 30 y.o. G3P2002 at [redacted]w[redacted]d by LMP admitted for induction of labor due to Gestational diabetes with poor adherence.  Subjective: Patient in bed with epidural in place, husband in room. Patient expressed she can feel some cramping with epidural but did not desire increasing at this time.  Request some medication for indigestion  Denies difficulty breathing, respiratory distress, and chest pain.  Objective: BP (!) 119/59   Pulse 93   Temp 99.2 F (37.3 C) (Axillary)   Resp 16   Ht 4\' 11"  (1.499 m)   Wt 112.4 kg   LMP 12/07/2019 (Exact Date)   SpO2 100%   BMI 50.05 kg/m  No intake/output data recorded. No intake/output data recorded.  FHT:  FHR: 145 bpm, variability: moderate,  accelerations:  Present,  decelerations:  Absent UC:   regular, every 2.5-5 minutes SVE:   Dilation: 4.5 Effacement (%): 60 Station: -3 Exam by:: [email or web address], SCNM  Labs: Lab Results  Component Value Date   WBC 5.4 08/23/2020   HGB 10.7 (L) 08/23/2020   HCT 30.8 (L) 08/23/2020   MCV 88.3 08/23/2020   PLT 252 08/23/2020    Assessment / Plan: Induction of labor due to gestational diabetes,  progressing well on pitocin  Labor: Progressing on pitocin. Discussed AROM, patient declined at this time. Preeclampsia:  no signs or symptoms of toxicity Fetal Wellbeing:  Category I Pain Control:  Epidural I/D:  n/a Anticipated MOD:  NSVD   Shakeyla Giebler 10/21/2020, Student-MidWife Wynonia Hazard 08/24/2020, 2:52 AM

## 2020-08-24 NOTE — Progress Notes (Signed)
Colleen Fields is a 30 y.o. G3P2002 at [redacted]w[redacted]d by LMP admitted for induction of labor due to Gestational diabetes.  Subjective: Patient resting in bed on left side. Husband sleep on sofa in room.  Denies respiratory distress, breathing difficulty, chest pain, or pelvic pressure.  Objective: BP (!) 96/54   Pulse 87   Temp 98.6 F (37 C) (Axillary)   Resp 17   Ht 4\' 11"  (1.499 m)   Wt 112.4 kg   LMP 12/07/2019 (Exact Date)   SpO2 100%   BMI 50.05 kg/m  No intake/output data recorded. No intake/output data recorded.  FHT:  FHR: 150 bpm, variability: moderate,  accelerations:  Present,  decelerations:  Absent UC:   irregular, every 2-6 minutes SVE:   Dilation: 4.5 Effacement (%): 60 Station: -3 Exam by:: [email or web address], SCNM  Labs: Lab Results  Component Value Date   WBC 5.4 08/23/2020   HGB 10.7 (L) 08/23/2020   HCT 30.8 (L) 08/23/2020   MCV 88.3 08/23/2020   PLT 252 08/23/2020    Assessment / Plan: Induction of labor due to gestational diabetes,  progressing well on pitocin  Labor: Pitocin titrating up per protocol. Patient declines AROM at this time. Preeclampsia:  no signs or symptoms of toxicity Fetal Wellbeing:  Category I Pain Control:  Epidural I/D:  n/a Anticipated MOD:  NSVD   Reassurance provided.  Anticipatory guidance regarding labor management. Will continue to monitor progress.  10/21/2020, Student-MidWife Juliann Pares 08/24/2020, 6:45 AM

## 2020-08-24 NOTE — Lactation Note (Signed)
This note was copied from a baby's chart. Lactation Consultation Note  Patient Name: Colleen Fields OIBBC'W Date: 08/24/2020 Reason for consult: Follow-up assessment;Early term 37-38.6wks;Maternal endocrine disorder Age:31 hours  Upon entering the room mom resting in bed with baby skin to skin. Mom states baby just finished nursing from right breast for , states baby still with cues. Mom reports breastfeeding 2 previous children 46mo d/c because milk dried up (child now 10yo) and ~84yrs(child now 1yo), plans to BF as long as baby desires. Mom reports having a DEBP at home. Mom prefers to supplement with own BM then formula (declined DBM).  LC encouraged mom to latch baby back to breast since with cues. Baby latched and sucked 3-4 times then released breast. Encouraged mom to hold baby skin to skin. Advised cue based feedings, wake if >3hrs since last feeding, optimal skin to skin, DEBP and frequency. Mom voiced understanding and with no further concerns. Left the room with mom holding baby skin to skin. BGilliam, RN, IBCLC  Maternal Data Has patient been taught Hand Expression?: No Does the patient have breastfeeding experience prior to this delivery?: Yes How long did the patient breastfeed?: 88mo (child now 10yo), ~40yrs (child now 1yo)  Feeding Mother's Current Feeding Choice: Breast Milk  LATCH Score Latch: Repeated attempts needed to sustain latch, nipple held in mouth throughout feeding, stimulation needed to elicit sucking reflex.  Audible Swallowing: None  Type of Nipple: Everted at rest and after stimulation  Comfort (Breast/Nipple): Soft / non-tender  Hold (Positioning): Assistance needed to correctly position infant at breast and maintain latch.  LATCH Score: 6   Lactation Tools Discussed/Used    Interventions Interventions: Breast feeding basics reviewed;Assisted with latch;Skin to skin;Adjust position  Discharge [facility] Program: No  Consult Status Consult  Status: Follow-up Date: 08/25/20 Follow-up type: In-patient    Colleen Fields 08/24/2020, 11:47 PM

## 2020-08-24 NOTE — Lactation Note (Signed)
This note was copied from a baby's chart. Lactation Consultation Note  Patient Name: Colleen Fields Date: 08/24/2020   Age:31 hours Colleen Needle, RN in the room assisting mom with getting up. Will return at a later time per mom's request. Colleen Rasher, RN, IBCLC  Colleen Fields 08/24/2020, 9:54 PM

## 2020-08-24 NOTE — Discharge Summary (Signed)
Postpartum Discharge Summary     Patient Name: Colleen Fields DOB: [birth year] MRN: [id number]  Date of admission: 08/23/2020 Delivery date:08/24/2020  Delivering provider: Shary Key  Date of discharge: 08/26/2020  Admitting diagnosis: GDM (gestational diabetes mellitus) [O24.419] Intrauterine pregnancy: [redacted]w[redacted]d    Secondary diagnosis:  Active Problems:   History of cesarean delivery followed by successful vaginal birth (VBAC)   Obesity in pregnancy   GDM (gestational diabetes mellitus)   Anxiety and depression   VBAC (vaginal birth after Cesarean)   Supervision of high-risk pregnancy   Anemia of pregnancy in third trimester   Obstructive sleep apnea on CPAP   Group B streptococcal infection during pregnancy   Blood transfusion declined because patient is Jehovah's Witness  Additional problems: none    Discharge diagnosis: Term Pregnancy Delivered, VBAC, Gestational Hypertension, GDM A1 and Anemia                                              Post partum procedures:None Augmentation: AROM, Pitocin and IP Foley Complications: None  Hospital course: Induction of Labor With Vaginal Delivery   30y.o. yo [id number] admitted to the hospital 08/23/2020 for induction of labor.  Indication for induction: A1 DM.  Patient was admitted and FB placed and pitocin initiated. AROM with clear fluid. IUPC placed. Patient progressed to complete and had uncomplicated delivery. Membrane Rupture Time/Date: 9:56 AM ,08/24/2020   Delivery Method:Vaginal, Spontaneous  Episiotomy: None  Lacerations:  1st degree;Perineal  Details of delivery can be found in separate delivery note.  Patient had a routine postpartum course. Patient is discharged home 08/26/20.  Newborn Data: Birth date:08/24/2020  Birth time:3:06 PM  Gender:Female  Living status:Living  Apgars:8 ,9  Weight:2946 g   Magnesium Sulfate received: No BMZ received: No Rhophylac:N/A MMR:N/A T-DaP:declined Flu:  No Transfusion:No  Physical exam  Vitals:   08/25/20 0208 08/25/20 0515 08/25/20 2153 08/26/20 0602  BP: (!) 120/50 118/72 120/69 (!) 111/49  Pulse: 86 83 93 89  Resp: _0 Temp: 98.4 F (36.9 C) (!) 97.4 F (36.3 C) 98.5 F (36.9 C) 97.9 F (36.6 C)  TempSrc: Axillary Oral Oral Oral  SpO2: 100% 98%    Weight:      Height:       General: alert, cooperative and no distress Lochia: appropriate Uterine Fundus: firm Incision: N/A DVT Evaluation: No evidence of DVT seen on physical exam. Labs: Lab Results  Component Value Date   WBC 5.9 08/25/2020   HGB 9.2 (L) 08/25/2020   HCT 27.4 (L) 08/25/2020   MCV 88.1 08/25/2020   PLT 196 08/25/2020   CMP Latest Ref Rng & Units 08/25/2020  Glucose 70 - 99 mg/dL 115(H)  BUN 6 - 20 mg/dL 5(L)  Creatinine 0.44 - 1.00 mg/dL 0.52  Sodium 135 - 145 mmol/L 136  Potassium 3.5 - 5.1 mmol/L 3.6  Chloride 98 - 111 mmol/L 107  CO2 22 - 32 mmol/L 20(L)  Calcium 8.9 - 10.3 mg/dL 8.7(L)  Total Protein 6.5 - 8.1 g/dL 5.3(L)  Total Bilirubin 0.3 - 1.2 mg/dL 0.6  Alkaline Phos 38 - 126 U/L 47  AST 15 - 41 U/L 17  ALT 0 - 44 U/L 12   Edinburgh Score: Edinburgh Postnatal Depression Scale Screening Tool 03/09/2018  I have been able to laugh and  see the funny side of things. 0  I have looked forward with enjoyment to things. 1  I have blamed myself unnecessarily when things went wrong. 0  I have been anxious or worried for no good reason. 0  I have felt scared or panicky for no good reason. 0  Things have been getting on top of me. 0  I have been so unhappy that I have had difficulty sleeping. 0  I have felt sad or miserable. 0  I have been so unhappy that I have been crying. 0  The thought of harming myself has occurred to me. 0  Edinburgh Postnatal Depression Scale Total 1     After visit meds:  Allergies as of 08/26/2020   No Known Allergies     Medication List    STOP taking these medications   Accu-Chek Guide w/Device  Kit   Accu-Chek Softclix Lancets lancets   aspirin 81 MG chewable tablet   Comfort Fit Maternity Supp Lg Misc   glucose blood test strip     TAKE these medications   acetaminophen 325 MG tablet Commonly known as: Tylenol Take 3 tablets (975 mg total) by mouth every 6 (six) hours as needed.   calcium carbonate 500 MG chewable tablet Commonly known as: TUMS - dosed in mg elemental calcium Chew 3 tablets by mouth daily as needed for indigestion or heartburn.   ferrous sulfate 325 (65 FE) MG tablet Commonly known as: FerrouSul Take 1 tablet (325 mg total) by mouth every other day.   ibuprofen 600 MG tablet Commonly known as: ADVIL Take 1 tablet (600 mg total) by mouth every 6 (six) hours as needed.   norethindrone 0.35 MG tablet Commonly known as: Ortho Micronor Take 1 tablet (0.35 mg total) by mouth daily. Initiate at 4 weeks postpartum   prenatal multivitamin Tabs tablet Take 1 tablet by mouth daily at 12 noon.        Discharge home in stable condition Infant Feeding: Breast Infant Disposition:home with mother Discharge instruction: per After Visit Summary and Postpartum booklet. Activity: Advance as tolerated. Pelvic rest for 6 weeks.  Diet: routine diet Future Appointments:No future appointments. Follow up Visit: Message sent to [facility] 08/24/20 by Sylvester Harder.   Please schedule this patient for a In person postpartum visit in 4 weeks with the following provider: Any provider. Additional Postpartum F/U:Postpartum Depression checkup, 2 hour GTT and BP check 1 week  High risk pregnancy complicated by: GDM and HTN Delivery mode:  Vaginal, Spontaneous  Anticipated Birth Control:  POPs   08/26/2020 EMILY Madelin Headings, MD  Attestation of Supervision of Student:  I confirm that I have verified the information documented in the resident's student's note and that I have also personally reperformed the history, physical exam and all medical decision making activities.  I have  verified that all services and findings are accurately documented in this student's note; and I agree with management and plan as outlined in the documentation. I have also made any necessary editorial changes.   Darlina Rumpf, [place] for [employer], [place] [employer] 08/26/2020 11:55 AM

## 2020-08-24 NOTE — Progress Notes (Signed)
Labor Progress Note Colleen Fields is a 30 y.o. G3P2002 at [redacted]w[redacted]d presented for IOL-non-adherent A1GDM.  S: Doing well without complaints. Extensive questioning regarding AROM and risk of cesarean section.  O:  BP 137/67   Pulse 97   Temp 99.6 F (37.6 C) (Oral)   Resp 18   Ht 4\' 11"  (1.499 m)   Wt 112.4 kg   LMP 12/07/2019 (Exact Date)   SpO2 100%   BMI 50.05 kg/m  EFM: previously 150bpm, now 180bpm/mod variability/+ accels/no decels Toco: difficult to trace  CVE: Dilation: 6 Effacement (%): 50 Cervical Position: Posterior Station: -2 Presentation: Vertex Exam by:: Dr. [email or web address]   A&P: 30 y.o. 26 [redacted]w[redacted]d presented for IOL-non adherent A1GDM. #IOL/TOLAC: Pitocin started 2/11 @0415  on 08/23/20 and FB dislodged 08/23/20 @1230 . Patient has previously refused AROM. Patient has made some cervical change since last exam and head is well applied to cervix, discussed extensively with patient the risks of deferring AROM at this time given maternal temperature and fetal and maternal heart rate all rising. Discussed increased risk of cesarean section due to triple I or arrest of dilation if patient continues to defer AROM. Patient requested time to discuss further with partner. Patient and partner comfortable with AROM/IUPC placement, clear fluid. Will continue to titrate pitocin.  #Pain: epidural #FWB: cat 2, fetal tachycardia, giving 1L LR bolus #GBS positive, PCN, adequate prophylaxis  #Maternal tachycardia/fetal tachycardia: maternal temp 99.36F, maternal HR increased from 90s-110s to 130s and FHR 150s previously now 180s. Suspect triple I developing, will administer 1L LR bolus and recheck temperature in 1 hour. Will start amp/gent if temp >100.4.  #A2GDM: poor compliance with meds. BGL check q2 hours. BGL well controlled since admit.  #BMI 50  #Anxiety/depressin: no meds, mood stable. SW postpartum.   #Jehova's Witness: patient refuses blood products, consents previously  signed. Okay with cell saver. Counseled extensively on risks of refusal.  10/21/20, MD 9:46 AM

## 2020-08-24 NOTE — Progress Notes (Signed)
Labor Progress Note Colleen Fields is a 30 y.o. G3P2002 at [redacted]w[redacted]d presented for IOL-non-adherent A1GDM.  S: Doing well without complaints, sleeping.  O:  BP (!) 112/59   Pulse 85   Temp 98.2 F (36.8 C) (Oral)   Resp 20   Ht 4\' 11"  (1.499 m)   Wt 112.4 kg   LMP 12/07/2019 (Exact Date)   SpO2 100%   BMI 50.05 kg/m  EFM: previously 140bpm/mod variability/+ accels/no decels Toco: difficult to trace  CVE: Dilation: 6 Effacement (%): 50 Cervical Position: Posterior Station: -2 Presentation: Vertex Exam by:: Dr. [email or web address]   A&P: 30 y.o. 26 [redacted]w[redacted]d presented for IOL-non adherent A1GDM. #IOL/TOLAC: Pitocin started 2/11 @0415  on 08/23/20 and FB dislodged 08/23/20 @1230 . AROM with clear fluid and IUPC placed at last check. Patient has not made any cervical change since last check, MVUs inadequate. Continue to titrate pitocin, currently at 34mL/hr. Discussed with Dr. 10/21/20, in agreement with plan, okay to titrate to 26mL/hr. #Pain: epidural #FWB: cat 1 #GBS positive, PCN, adequate prophylaxis  #Maternal tachycardia/fetal tachycardia: maternal temp previously 99.54F and maternal tachycardia and fetal tachycardia noted. S/p 1L LR bolus. Temp wnl and fetal and maternal tachycardia resolved. Continue to monitor.  #A2GDM: poor compliance with meds. BGL check q2 hours. BGL well controlled since admit.  #BMI 50  #Anxiety/depressin: no meds, mood stable. SW postpartum.   #Jehova's Witness: patient refuses blood products, consents previously signed. Okay with cell saver. Counseled extensively on risks of refusal.  20m, MD 12:55 PM

## 2020-08-24 NOTE — Progress Notes (Signed)
Labor Progress Note Colleen Fields is a 30 y.o. G3P2002 at [redacted]w[redacted]d presented for IOL-non-adherent A1GDM.  S: Doing well, feeling increased vaginal pressure.  O:  BP 104/76   Pulse (!) 127   Temp 98 F (36.7 C) (Oral)   Resp 20   Ht 4\' 11"  (1.499 m)   Wt 112.4 kg   LMP 12/07/2019 (Exact Date)   SpO2 100%   BMI 50.05 kg/m  EFM: previously 140bpm/mod variability/+ accels/no decels Toco: q1-28min  CVE: Dilation: 7 Effacement (%): 90 Cervical Position: Posterior Station: -1 Presentation: Vertex Exam by:: Dr. [email or web address]   A&P: 30 y.o. 26 [redacted]w[redacted]d presented for IOL-non adherent A1GDM. #IOL/TOLAC: Pitocin started 2/11 @0415  on 08/23/20 and FB dislodged 08/23/20 @1230 . AROM with clear fluid and IUPC placed @1000 . Pitocin currently @36mL /hr, intermittently adequate contractions. Will recheck in 4 hours. #Pain: epidural #FWB: cat 1 #GBS positive, PCN, adequate prophylaxis  #Maternal tachycardia/fetal tachycardia: maternal temp previously 99.75F and maternal tachycardia and fetal tachycardia noted. S/p 1L LR bolus. Temp wnl and fetal and maternal tachycardia resolved. Continue to monitor.  #A2GDM: poor compliance with meds. BGL check q2 hours. BGL well controlled since admit.  #BMI 50  #Anxiety/depressin: no meds, mood stable. SW postpartum.   #Jehova's Witness: patient refuses blood products, consents previously signed. Okay with cell saver. Counseled extensively on risks of refusal.  10/21/20, MD 2:46 PM

## 2020-08-25 ENCOUNTER — Encounter (HOSPITAL_COMMUNITY): Payer: Self-pay | Admitting: Obstetrics and Gynecology

## 2020-08-25 LAB — CBC
HCT: 27.4 % — ABNORMAL LOW (ref 36.0–46.0)
Hemoglobin: 9.2 g/dL — ABNORMAL LOW (ref 12.0–15.0)
MCH: 29.6 pg (ref 26.0–34.0)
MCHC: 33.6 g/dL (ref 30.0–36.0)
MCV: 88.1 fL (ref 80.0–100.0)
Platelets: 196 10*3/uL (ref 150–400)
RBC: 3.11 MIL/uL — ABNORMAL LOW (ref 3.87–5.11)
RDW: 14 % (ref 11.5–15.5)
WBC: 5.9 10*3/uL (ref 4.0–10.5)
nRBC: 0 % (ref 0.0–0.2)

## 2020-08-25 LAB — COMPREHENSIVE METABOLIC PANEL
ALT: 12 U/L (ref 0–44)
AST: 17 U/L (ref 15–41)
Albumin: 2.2 g/dL — ABNORMAL LOW (ref 3.5–5.0)
Alkaline Phosphatase: 47 U/L (ref 38–126)
Anion gap: 9 (ref 5–15)
BUN: 5 mg/dL — ABNORMAL LOW (ref 6–20)
CO2: 20 mmol/L — ABNORMAL LOW (ref 22–32)
Calcium: 8.7 mg/dL — ABNORMAL LOW (ref 8.9–10.3)
Chloride: 107 mmol/L (ref 98–111)
Creatinine, Ser: 0.52 mg/dL (ref 0.44–1.00)
GFR, Estimated: >60 mL/min (ref >60)
Glucose, Bld: 115 mg/dL — ABNORMAL HIGH (ref 70–99)
Potassium: 3.6 mmol/L (ref 3.5–5.1)
Sodium: 136 mmol/L (ref 135–145)
Total Bilirubin: 0.6 mg/dL (ref 0.3–1.2)
Total Protein: 5.3 g/dL — ABNORMAL LOW (ref 6.5–8.1)

## 2020-08-25 LAB — GLUCOSE, CAPILLARY: Glucose-Capillary: 116 mg/dL — ABNORMAL HIGH (ref 70–99)

## 2020-08-25 MED ORDER — FERROUS SULFATE 325 (65 FE) MG PO TABS
325.0000 mg | ORAL_TABLET | ORAL | Status: DC
Start: 2020-08-25 — End: 2020-08-26
  Administered 2020-08-25: 325 mg via ORAL

## 2020-08-25 MED ORDER — FERROUS SULFATE 325 (65 FE) MG PO TABS
325.0000 mg | ORAL_TABLET | ORAL | Status: DC
  Filled 2020-08-25: qty 1

## 2020-08-25 MED ORDER — VITAMIN C 250 MG PO TABS
250.0000 mg | ORAL_TABLET | ORAL | Status: DC
  Administered 2020-08-25: 250 mg via ORAL
  Filled 2020-08-25: qty 1

## 2020-08-25 NOTE — Progress Notes (Signed)
CSW received consult due to score 10 on Edinburgh Depression Screen and hx of anxiety/depression. When CSW arrived, MOB was resting in bed while bonding with infant. MOB and infant were engaged in skin to skin and they both appeared happy and comfortable.  CSW explained CSW's role and MOB was receptive to meeting with CSW.  MOB was polite, forthcoming, and easy to engage.   CSW asked about MOB's MH hx.  MOB openly shared that she was dx with anxiety/depression in 2016. Per MOB she has a therapist that she is established with (MOB could not remember his name) and she reported feeling comfortable making an appointment if needed. MOB communicated, "I don't worry about my anxiety and depression because it comes and goes and don not get in the way of my everyday life things. MOB however did share feeling anxious throughout her pregnancy.   CSW provided education regarding Baby Blues vs PMADs and provided MOB with resources for mental health follow up.  CSW encouraged MOB to evaluate her mental health throughout the postpartum period with the use of the New Mom Checklist developed by Postpartum Progress as well as the Edinburgh Postnatal Depression Scale and notify a medical professional if symptoms arise.  MOB presented with insight and awareness and did not demonstrate any acute MH symptoms. MOB reported having a good support team that consist of she and FOB's immediate and extended family members and she shared feeling comfortable seeking help if needed. CSW assessed for safety and MOB denied SI, HI, and DV.  MOB reported having all essential items to care for infant and feels prepared for infant's discharge.   There are no barriers to discharge.   Derrion Tritz Boyd-Gilyard, MSW, LCSW Clinical Social Work [phone] 

## 2020-08-25 NOTE — Lactation Note (Signed)
This note was copied from a baby's chart. Lactation Consultation Note  Patient Name: Colleen Fields DPOEU'M Date: 08/25/2020 Reason for consult: Follow-up assessment Age:31 hours LC assisted mom in feeding. Mom reports she is discouraged because dad feels he is not breastfeeding well because of her. Attempted to encourage mom. Reminded her he was early term and just to keep working with him and pumping. Left mom and baby breastfeeding.  LC to follow up with mom in pm.  Maternal Data    Feeding Mother's Current Feeding Choice: Breast Milk and Formula Nipple Type: Slow - flow  LATCH Score Latch: Grasps breast easily, tongue down, lips flanged, rhythmical sucking.  Audible Swallowing: A few with stimulation  Type of Nipple: Everted at rest and after stimulation  Comfort (Breast/Nipple): Soft / non-tender  Hold (Positioning): Assistance needed to correctly position infant at breast and maintain latch.  LATCH Score: 8   Lactation Tools Discussed/Used    Interventions Interventions: Breast feeding basics reviewed;Assisted with latch  Discharge    Consult Status Consult Status: Follow-up Date: 08/25/20 Follow-up type: In-patient    [facility] Colleen Fields 08/25/2020, 6:31 PM

## 2020-08-25 NOTE — Anesthesia Postprocedure Evaluation (Signed)
Anesthesia Post Note  Patient: Colleen Fields  Procedure(s) Performed: AN AD HOC LABOR EPIDURAL     Patient location during evaluation: Mother Baby Anesthesia Type: Epidural Level of consciousness: awake and alert and oriented Pain management: satisfactory to patient Vital Signs Assessment: post-procedure vital signs reviewed and stable Respiratory status: spontaneous breathing and nonlabored ventilation Cardiovascular status: stable Postop Assessment: no headache, no backache, no signs of nausea or vomiting, adequate PO intake, patient able to bend at knees and able to ambulate (patient up walking) Anesthetic complications: no   No complications documented.  Last Vitals:  Vitals:   08/25/20 0208 08/25/20 0515  BP: (!) 120/50 118/72  Pulse: 86 83  Resp: 16 18  Temp: 36.9 C (!) 36.3 C  SpO2: 100% 98%    Last Pain:  Vitals:   08/25/20 0714  TempSrc:   PainSc: Asleep   Pain Goal:                   Madison Hickman

## 2020-08-25 NOTE — Progress Notes (Addendum)
POSTPARTUM PROGRESS NOTE  Subjective: Colleen Fields is a 30 y.o. [id number] PPD#1 VBAC at [redacted]w[redacted]d.  She endorses back pain/soreness from her epidural. Also endorses feeling more fatigued and weak. States it is difficult to pick up her baby. No acute events overnight. She denies any problems with ambulating, voiding or po intake. Denies nausea or vomiting. She has passed flatus. Pain is moderately controlled. Lochia is minimal.  Objective: Blood pressure 118/72, pulse 83, temperature (!) 97.4 F (36.3 C), temperature source Oral, resp. rate 18, height 4\' 11"  (1.499 m), weight 112.4 kg, last menstrual period 12/07/2019, SpO2 98 %, currently breastfeeding.  Physical Exam:  General: alert, cooperative and no distress Chest: no respiratory distress Abdomen: soft, non-tender  Uterine Fundus: firm and at level of umbilicus Extremities: No calf swelling or tenderness  no edema  Recent Labs    08/23/20 0113 08/25/20 0545  HGB 10.7* 9.2*  HCT 30.8* 27.4*    Assessment/Plan: Colleen Fields is a 30 y.o. 26 PPD#1 VBAC at [redacted]w[redacted]d for nonadherent A1GDM.  Routine Postpartum Care: Doing well, pain moderately controlled.  -- Continue routine care, lactation support  -- Contraception: POPs -- Feeding: breast   A2GDM: fasting CBG 116, will perform 2hr gtt at postpartum visit gHTN: BP stable. 120/50 this am, asymptomatic Anemia: Hgb 9.2 down from 10.7. Feeling more fatigued and weak. Starting PO iron qod  Anxiety/depression: no meds, mood stable. SW consult  Jehova's Witness: patient refuses blood products, consents previously signed.  Dispo: Plan for discharge tomorrow, 2/14.  3/14, DO OB Fellow, Faculty Practice 08/25/2020 8:24 AM   GME ATTESTATION:  I saw and evaluated the patient. I agree with the findings and the plan of care as documented in the resident's note.  08/27/2020, MD OB Fellow, Faculty [facility], [facility] for [facility] 08/25/2020 9:10 AM

## 2020-08-26 DIAGNOSIS — O99013 Anemia complicating pregnancy, third trimester: Secondary | ICD-10-CM

## 2020-08-26 DIAGNOSIS — Z3A37 37 weeks gestation of pregnancy: Secondary | ICD-10-CM

## 2020-08-26 DIAGNOSIS — O99210 Obesity complicating pregnancy, unspecified trimester: Secondary | ICD-10-CM

## 2020-08-26 DIAGNOSIS — O133 Gestational [pregnancy-induced] hypertension without significant proteinuria, third trimester: Secondary | ICD-10-CM

## 2020-08-26 DIAGNOSIS — D649 Anemia, unspecified: Secondary | ICD-10-CM

## 2020-08-26 DIAGNOSIS — O34219 Maternal care for unspecified type scar from previous cesarean delivery: Secondary | ICD-10-CM

## 2020-08-26 DIAGNOSIS — O24419 Gestational diabetes mellitus in pregnancy, unspecified control: Secondary | ICD-10-CM

## 2020-08-26 DIAGNOSIS — E669 Obesity, unspecified: Secondary | ICD-10-CM

## 2020-08-26 MED ORDER — NORETHINDRONE 0.35 MG PO TABS: 1.0000 | ORAL_TABLET | Freq: Every day | ORAL | 11 refills | Status: DC

## 2020-08-26 MED ORDER — IBUPROFEN 600 MG PO TABS: 600.0000 mg | ORAL_TABLET | Freq: Four times a day (QID) | ORAL | 0 refills | Status: DC | PRN

## 2020-08-26 MED ORDER — ACETAMINOPHEN 325 MG PO TABS: 1000.0000 mg | ORAL_TABLET | Freq: Four times a day (QID) | ORAL | Status: DC | PRN

## 2020-08-26 NOTE — Lactation Note (Signed)
This note was copied from a baby's chart. Lactation Consultation Note  Patient Name: Colleen Fields QMGQQ'P Date: 08/26/2020 Reason for consult: Follow-up assessment;Early term 37-38.6wks Age:31 hours  Follow up visit to 44 hours old with 5.80% weight loss of a P3 mother with breastfeeding experience. Baby is sleeping in mother's arms upon arrival. Mother states breastfeeding is going well but infant is not getting full. Mother continues to bottle-feeding ~45mL of formula to supplement. Infant has been been having good voids and stools, per mother. Mother reports a good deep latch with a slight correction of upper lip that infant tends to curl at times.  Mother is not a [facility] participant and she is considering to apply. Mother states she has 2 DEBP at home and she is comfortable using them.  Feeding plan:  1. Breastfeed following hunger cues.  2. Stimulate infant awake at the breast 3. Offer breast 8 -  12 times in 24h period to establish good milk supply.   4. If needed supplement with formula following guidelines, pace bottle feeding and fullness cues.   5. Encouraged maternal rest, hydration and food intake.  6. Contact Lactation Services or local resources for support, questions or concerns.    All questions answered at this time. Family is waiting to be discharged home today.   Maternal Data Has patient been taught Hand Expression?: Yes Does the patient have breastfeeding experience prior to this delivery?: Yes  Feeding Mother's Current Feeding Choice: Breast Milk and Formula Nipple Type: Extra Slow Flow  Lactation Tools Discussed/Used Tools: Pump Breast pump type: Double-Electric Breast Pump Reason for Pumping: stimulation and supplementation  Interventions Interventions: Breast feeding basics reviewed;Skin to skin;DEBP;Hand pump;Expressed milk;Education  Discharge Discharge Education: Engorgement and breast care;Warning signs for feeding baby Pump: Personal WIC  Program: No (mother is planning to apply)  Consult Status Consult Status: Complete Date: 08/26/20 Follow-up type: Call as needed    Sebastiano Luecke A Higuera Ancidey 08/26/2020, 11:50 AM

## 2020-08-26 NOTE — Progress Notes (Signed)
Voicemail left for Social worker [place] regarding an additional SS order from 08/25/20. Call back requested.
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
## 2020-08-27 ENCOUNTER — Telehealth: Payer: Self-pay | Admitting: Family Medicine

## 2020-08-27 LAB — SURGICAL PATHOLOGY

## 2020-08-27 NOTE — Telephone Encounter (Signed)
Pt states that the pharmacy does not have ...  acetaminophen 325 MG tablet  . ibuprofen 600 MG tablet As a prescription to pick up-pt states that she has some tylenol at home at it is not working and she in sever pain. Pt states she needs something for the pain.

## 2020-08-27 NOTE — Telephone Encounter (Signed)
Called pt who states pharmacy did not have Tylenol or ibuprofen ready for pick up when she went last night. States she is in a lot of pain; currently taking OTC Tylenol only. Called pharmacy who states she does not have orders for Tylenol or ibuprofen. Verbal order given for ibuprofen per OTC prescription written by Reita Cliche, CNM. Pharmacy staff states insurance will not cover Tylenol. Explained to pt she may purchase 500 mg Tylenol OTC and pick up ibuprofen. Explained to pt that she may rotate these medications every 3 hours until pain decreases. Encouraged pt to also try a heating pad for soreness in her back.   Pt states she is breastfeeding and is concerned baby is not getting enough milk. Offered lactation appt. Pt declines.

## 2020-08-27 NOTE — BH Specialist Note (Signed)
Integrated Behavioral Health via Telemedicine Visit  08/27/2020 Colleen Fields [id number]  Number of Integrated Behavioral Health visits: 4 Session Start time: 8:15  Session End time: 8:22 Total time: 7  Referring Provider: Mariel Aloe, MD Patient/Family location: Home [facility] Provider location: Center for [facility] at [facility]  All persons participating in visit: Patient Colleen Fields and [facility] Gardner Servantes   Types of Service: Individual psychotherapy  I connected with [name] and/or [name]'s n/a by Telephone  (Video is Caregility application) and verified that I am speaking with the correct person using two identifiers.Discussed confidentiality: Yes   I discussed the limitations of telemedicine and the availability of in person appointments.  Discussed there is a possibility of technology failure and discussed alternative modes of communication if that failure occurs.  I discussed that engaging in this telemedicine visit, they consent to the provision of behavioral healthcare and the services will be billed under their insurance.  Patient and/or legal guardian expressed understanding and consented to Telemedicine visit: Yes   Presenting Concerns: Patient and/or family reports the following symptoms/concerns: Pt states her first week postpartum "was rough, but feeling much better mentally" since that time; pt feels she is coping well with her mother helping at home, her husband not in the house, sleeping when baby sleeps and appetite returning postpartum. Pt feels mild fatigue with baby eating every 2.5 hours, is feeling him right now, and agrees to call if depressed or anxious symptoms return.  Duration of problem: Increase in pregnancy; decrease one week postpartum; Severity of problem: mild  Patient and/or Family's Strengths/Protective Factors: Social connections, Social and Emotional competence, Concrete supports in  place (healthy food, safe environments, etc.) and Sense of purpose  Goals Addressed: Patient will: 1.  Reduce symptoms of: fatigue  2.  Increase knowledge and/or ability of: healthy habits  3.  Demonstrate ability to: Increase healthy adjustment to current life circumstances and Increase adequate support systems for patient/family  Progress towards Goals: Ongoing  Interventions: Interventions utilized:  Solution-Focused Strategies and Psychoeducation and/or Health Education Standardized Assessments completed: Not Needed  Patient and/or Family Response: Pt agrees with treatment plan  Assessment: Patient currently experiencing Psychosocial stress.   Patient may benefit from continued psychoeducation and brief therapeutic interventions regarding coping with symptoms of current life stress .  Plan: 1. Follow up with behavioral health clinician on : Call Asher Muir at [phone] as needed (especially if symptoms of anxiety or depression increase postpartum) 2. Behavioral recommendations:  -Continue taking prenatal vitamin daily until postpartum medical visit  -Continue accepting practical and emotional support from supportive mother, and other supports -Consider registering for new mom support group at www.[facility].com (Mom Talk) or www.postpartum.net (search for online support groups) for additional support  3. Referral(s): Integrated Art gallery manager (In Clinic) and [employer]:  New mom support  I discussed the assessment and treatment plan with the patient and/or parent/guardian. They were provided an opportunity to ask questions and all were answered. They agreed with the plan and demonstrated an understanding of the instructions.   They were advised to call back or seek an in-person evaluation if the symptoms worsen or if the condition fails to improve as anticipated.  Valetta Close Jina Olenick, LCSW   Edinburgh Postnatal Depression Scale Screening Tool 08/24/2020  03/09/2018 02/16/2018 01/23/2018  I have been able to laugh and see the funny side of things. 0 0 2 1  I have looked forward with enjoyment to things. 0  1 2 1   I have blamed myself unnecessarily when things went wrong. 2 0 3 1  I have been anxious or worried for no good reason. 2 0 0 0  I have felt scared or panicky for no good reason. 0 0 3 3  Things have been getting on top of me. 2 0 0 1  I have been so unhappy that I have had difficulty sleeping. 1 0 0 2  I have felt sad or miserable. 2 0 2 2  I have been so unhappy that I have been crying. 1 0 2 1  The thought of harming myself has occurred to me. 0 0 0 1  Edinburgh Postnatal Depression Scale Total 10 1 14 13     Depression screen [facility] 2/9 08/22/2020 08/14/2020 06/26/2020 05/29/2020 05/03/2020  Decreased Interest 2 3 2 3 2   Down, Depressed, Hopeless 2 2 2 3 2   PHQ - 2 Score 4 5 4 6 4   Altered sleeping 2 3 3 3 2   Tired, decreased energy 2 3 3 3 2   Change in appetite 3 3 2 3 3   Feeling bad or failure about yourself  3 0 2 3 0  Trouble concentrating 1 2 2 3 3   Moving slowly or fidgety/restless 0 1 2 3 3   Suicidal thoughts 0 0 2 0 0  PHQ-9 Score 15 17 20 24 17   Some recent data might be hidden   GAD 7 : Generalized Anxiety Score 08/22/2020 08/14/2020 06/26/2020 05/29/2020  Nervous, Anxious, on Edge 3 3 3 3   Control/stop worrying 2 3 2 3   Worry too much - different things 3 3 2 3   Trouble relaxing 3 0 2 3  Restless 0 0 1 0  Easily annoyed or irritable 3 3 3 3   Afraid - awful might happen 1 0 2 1  Total GAD 7 Score 15 12 15  16

## 2020-08-30 ENCOUNTER — Ambulatory Visit: Payer: No Typology Code available for payment source

## 2020-08-30 ENCOUNTER — Telehealth (INDEPENDENT_AMBULATORY_CARE_PROVIDER_SITE_OTHER): Payer: No Typology Code available for payment source

## 2020-08-30 VITALS — BP 125/65 | HR 86

## 2020-08-30 DIAGNOSIS — Z0130 Encounter for examination of blood pressure without abnormal findings: Secondary | ICD-10-CM | POA: Diagnosis not present

## 2020-08-30 NOTE — Progress Notes (Addendum)
Pt was scheduled for in-office BP check; pt no-showed. Called pt, pt willing to do virtual appt. I verified that I am speaking with the correct person using two identifiers.   Pt is 6 days PP following vaginal delivery. Pain continues to improve with rotation of Tylenol and ibuprofen. Per chart review, pt was diagnosed with gestational hypertension during hospital admission for delivery; pt was not discharged on any BP medications. BP today is 125/65; HR 86. Pt to follow up at [facility] appt on 09/23/20. Plan of care reviewed with Vonzella Nipple, PA.  Marjo Bicker, RN 08/30/2020  11:45 AM  Addendum:  Visit on 08/30/20 was completed via telephone; approx 10 minutes spent with pt on telephone. Pt identity confirmed with two identifiers. Pt located at home, provider located at [facility]. Fleet Contras RN 09/03/20

## 2020-08-30 NOTE — Progress Notes (Signed)
Chart reviewed for nurse visit. Agree with plan of care.   Marny Lowenstein, PA-C 08/30/2020 12:17 PM

## 2020-09-01 ENCOUNTER — Other Ambulatory Visit: Payer: Self-pay

## 2020-09-01 ENCOUNTER — Inpatient Hospital Stay (HOSPITAL_COMMUNITY)
Admission: AD | Admit: 2020-09-01 | Discharge: 2020-09-03 | DRG: 776 | Disposition: A | Discharge status: Home / Self Care | Payer: No Typology Code available for payment source | Attending: Obstetrics & Gynecology | Admitting: Obstetrics & Gynecology

## 2020-09-01 DIAGNOSIS — Z20822 Contact with and (suspected) exposure to covid-19: Secondary | ICD-10-CM | POA: Diagnosis present

## 2020-09-01 DIAGNOSIS — O1415 Severe pre-eclampsia, complicating the puerperium: Principal | ICD-10-CM

## 2020-09-01 DIAGNOSIS — Z8759 Personal history of other complications of pregnancy, childbirth and the puerperium: Secondary | ICD-10-CM | POA: Diagnosis present

## 2020-09-01 MED ORDER — BUTALBITAL-APAP-CAFFEINE 50-325-40 MG PO TABS
2.0000 | ORAL_TABLET | Freq: Once | ORAL | Status: AC
  Administered 2020-09-01: 2 via ORAL
  Filled 2020-09-01: qty 2

## 2020-09-01 NOTE — MAU Note (Signed)
Pt reports last pm she developed a headache, has taken OTC's without relief, left eye seems blurry. States she feels weak. S/p vaginal delivery on 08/24/2020. ? Fever. B/p at home 150/70's

## 2020-09-01 NOTE — MAU Provider Note (Signed)
History     CSN: [id number]  Arrival date and time: 09/01/20 2251   Event Date/Time   First Provider Initiated Contact with Patient 09/01/20 2351      Chief Complaint  Patient presents with  . Headache  . Hypertension   HPI  [name] is a 30 y.o. female G3P3003 status post vaginal delivery on 2/12; she was induced 2/2 GDM. She reports new onset HA last night, which was unrelieved by ibuprofen and tylenol. She reports worsening of HA throughout today and the feeling of her left eye now being blurred and not clear.  She denies any history of GHTN and is unsure why it was listed on her problem list. She has had no issues with her BP in any of her other pregnancies.   OB History    Gravida  3   Para  3   Term  3   Preterm  0   AB      Living  3     SAB      IAB      Ectopic      Multiple  0   Live Births  3           Past Medical History:  Diagnosis Date  . Chest pain   . Depression   . Gestational diabetes    insulin  . Migraine   . Obesity 07/22/2017  . Palpitations   . Tachycardia     Past Surgical History:  Procedure Laterality Date  . CESAREAN SECTION    . WISDOM TOOTH EXTRACTION      Family History  Problem Relation Age of Onset  . Hypertension Mother   . Bipolar disorder Father   . Schizophrenia Father     Social History   Tobacco Use  . Smoking status: Never Smoker  . Smokeless tobacco: Never Used  Vaping Use  . Vaping Use: Never used  Substance Use Topics  . Alcohol use: No  . Drug use: No    Types: Marijuana    Comment: "hasn't smoked in forever"    Allergies: No Known Allergies  Medications Prior to Admission  Medication Sig Dispense Refill Last Dose  . acetaminophen (TYLENOL) 325 MG tablet Take 3 tablets (975 mg total) by mouth every 6 (six) hours as needed.     . calcium carbonate (TUMS - DOSED IN MG ELEMENTAL CALCIUM) 500 MG chewable tablet Chew 3 tablets by mouth daily as needed for indigestion or  heartburn.     . ferrous sulfate (FERROUSUL) 325 (65 FE) MG tablet Take 1 tablet (325 mg total) by mouth every other day. 30 tablet 3   . ibuprofen (ADVIL) 600 MG tablet Take 1 tablet (600 mg total) by mouth every 6 (six) hours as needed. 30 tablet 0   . norethindrone (ORTHO MICRONOR) 0.35 MG tablet Take 1 tablet (0.35 mg total) by mouth daily. Initiate at 4 weeks postpartum 28 tablet 11   . Prenatal Vit-Fe Fumarate-FA (PRENATAL MULTIVITAMIN) TABS tablet Take 1 tablet by mouth daily at 12 noon.      Results for orders placed or performed during the hospital encounter of 09/01/20 (from the past 48 hour(s))  CBC with Differential/Platelet     Status: Abnormal   Collection Time: 09/01/20 11:44 PM  Result Value Ref Range   WBC 5.9 4.0 - 10.5 K/uL   RBC 3.40 (L) 3.87 - 5.11 MIL/uL   Hemoglobin 10.4 (L) 12.0 - 15.0 g/dL   HCT 38.7 (  L) 36.0 - 46.0 %   MCV 88.8 80.0 - 100.0 fL   MCH 30.6 26.0 - 34.0 pg   MCHC 34.4 30.0 - 36.0 g/dL   RDW 58.5 27.7 - 82.4 %   Platelets 325 150 - 400 K/uL   nRBC 0.3 (H) 0.0 - 0.2 %   Neutrophils Relative % 68 %   Neutro Abs 4.0 1.7 - 7.7 K/uL   Lymphocytes Relative 23 %   Lymphs Abs 1.3 0.7 - 4.0 K/uL   Monocytes Relative 7 %   Monocytes Absolute 0.4 0.1 - 1.0 K/uL   Eosinophils Relative 1 %   Eosinophils Absolute 0.1 0.0 - 0.5 K/uL   Basophils Relative 0 %   Basophils Absolute 0.0 0.0 - 0.1 K/uL   Immature Granulocytes 1 %   Abs Immature Granulocytes 0.06 0.00 - 0.07 K/uL    Comment: Performed at [facility], [address] [place], [place] [address]  Comprehensive metabolic panel     Status: Abnormal   Collection Time: 09/01/20 11:44 PM  Result Value Ref Range   Sodium 135 135 - 145 mmol/L   Potassium 3.4 (L) 3.5 - 5.1 mmol/L   Chloride 103 98 - 111 mmol/L   CO2 25 22 - 32 mmol/L   Glucose, Bld 110 (H) 70 - 99 mg/dL    Comment: Glucose reference range applies only to samples taken after fasting for at least 8 hours.   BUN 7 6 - 20 mg/dL    Creatinine, Ser 1.44 0.44 - 1.00 mg/dL   Calcium 8.7 (L) 8.9 - 10.3 mg/dL   Total Protein 6.4 (L) 6.5 - 8.1 g/dL   Albumin 2.9 (L) 3.5 - 5.0 g/dL   AST 18 15 - 41 U/L   ALT 18 0 - 44 U/L   Alkaline Phosphatase 47 38 - 126 U/L   Total Bilirubin 0.5 0.3 - 1.2 mg/dL   GFR, Estimated >31 >54 mL/min    Comment: (NOTE) Calculated using the CKD-EPI Creatinine Equation (2021)    Anion gap 7 5 - 15    Comment: Performed at [facility], [address] [place], [place] [address]   Review of Systems  Constitutional: Negative for fever.  Eyes: Positive for photophobia and visual disturbance.  Gastrointestinal: Negative for abdominal pain.  Genitourinary: Positive for vaginal bleeding.  Neurological: Positive for headaches. Negative for dizziness and facial asymmetry.   Physical Exam   Blood pressure (!) 159/89, pulse 80, temperature 98.7 F (37.1 C), temperature source Oral, resp. rate 17, height 4\' 11"  (1.499 m), weight 109.3 kg, last menstrual period 12/07/2019, SpO2 99 %, unknown if currently breastfeeding.  Patient Vitals for the past 24 hrs:  BP Temp Temp src Pulse Resp SpO2 Height Weight  09/02/20 0100 131/78 -- -- 67 -- 100 % -- --  09/02/20 0045 (!) 149/79 -- -- 70 -- 100 % -- --  09/02/20 0030 (!) 144/78 -- -- 73 -- 100 % -- --  09/02/20 0015 140/80 -- -- 74 -- 100 % -- --  09/02/20 0000 (!) 149/79 -- -- 73 -- 100 % -- --  09/01/20 2346 (!) 159/89 -- -- 80 -- -- -- --  09/01/20 2332 (!) 153/91 -- -- 83 -- -- -- --  09/01/20 2312 (!) 166/94 98.7 F (37.1 C) Oral 78 17 99 % 4\' 11"  (1.499 m) 109.3 kg    Physical Exam Constitutional:      General: She is not in acute distress.  Appearance: She is well-developed. She is obese. She is not ill-appearing.  Eyes:     Extraocular Movements: Extraocular movements intact.     Right eye: Normal extraocular motion.     Left eye: Normal extraocular motion.     Pupils: Pupils are equal, round, and reactive to light. Pupils are  equal.  Musculoskeletal:        General: Normal range of motion.  Neurological:     General: No focal deficit present.     Mental Status: She is alert and oriented to person, place, and time.     Motor: Motor function is intact.     Coordination: Coordination is intact.     Deep Tendon Reflexes: Reflexes are normal and symmetric. Reflexes normal.     Comments: Negative clonus   Psychiatric:        Behavior: Behavior normal.    MAU Course  Procedures  None  MDM  PIH labs collected and reviewed Fioricet given 2 tablets & nifedipine 30 mg XL Headache down to 4/10; continues to reports changes in left eye vision/ twitching.  Reviewed patient with Dr. Despina Hidden, will admit for magnesium.   Assessment and Plan   A:  1. Pre-eclampsia, severe, delivered with postpartum condition     P:  Admit to [facility]  Magnesium ordered Continue procardia  Will repeat labs   Colleen Fields, Colleen Rutherford, NP 09/02/2020 1:09 AM

## 2020-09-02 ENCOUNTER — Inpatient Hospital Stay (HOSPITAL_COMMUNITY): Payer: No Typology Code available for payment source

## 2020-09-02 DIAGNOSIS — Z20822 Contact with and (suspected) exposure to covid-19: Secondary | ICD-10-CM | POA: Diagnosis present

## 2020-09-02 DIAGNOSIS — R519 Headache, unspecified: Secondary | ICD-10-CM | POA: Diagnosis not present

## 2020-09-02 DIAGNOSIS — Z8759 Personal history of other complications of pregnancy, childbirth and the puerperium: Secondary | ICD-10-CM

## 2020-09-02 DIAGNOSIS — O1415 Severe pre-eclampsia, complicating the puerperium: Secondary | ICD-10-CM | POA: Diagnosis not present

## 2020-09-02 HISTORY — DX: Personal history of other complications of pregnancy, childbirth and the puerperium: Z87.59

## 2020-09-02 LAB — CBC WITH DIFFERENTIAL/PLATELET
Abs Immature Granulocytes: 0.05 10*3/uL (ref 0.00–0.07)
Abs Immature Granulocytes: 0.06 10*3/uL (ref 0.00–0.07)
Basophils Absolute: 0 10*3/uL (ref 0.0–0.1)
Basophils Absolute: 0 10*3/uL (ref 0.0–0.1)
Basophils Relative: 0 %
Basophils Relative: 1 %
Eosinophils Absolute: 0.1 10*3/uL (ref 0.0–0.5)
Eosinophils Absolute: 0.1 10*3/uL (ref 0.0–0.5)
Eosinophils Relative: 1 %
Eosinophils Relative: 1 %
HCT: 30.2 % — ABNORMAL LOW (ref 36.0–46.0)
HCT: 33.7 % — ABNORMAL LOW (ref 36.0–46.0)
Hemoglobin: 10.4 g/dL — ABNORMAL LOW (ref 12.0–15.0)
Hemoglobin: 11.1 g/dL — ABNORMAL LOW (ref 12.0–15.0)
Immature Granulocytes: 1 %
Immature Granulocytes: 1 %
Lymphocytes Relative: 23 %
Lymphocytes Relative: 30 %
Lymphs Abs: 1.3 10*3/uL (ref 0.7–4.0)
Lymphs Abs: 1.9 10*3/uL (ref 0.7–4.0)
MCH: 30 pg (ref 26.0–34.0)
MCH: 30.6 pg (ref 26.0–34.0)
MCHC: 32.9 g/dL (ref 30.0–36.0)
MCHC: 34.4 g/dL (ref 30.0–36.0)
MCV: 88.8 fL (ref 80.0–100.0)
MCV: 91.1 fL (ref 80.0–100.0)
Monocytes Absolute: 0.4 10*3/uL (ref 0.1–1.0)
Monocytes Absolute: 0.4 10*3/uL (ref 0.1–1.0)
Monocytes Relative: 7 %
Monocytes Relative: 7 %
Neutro Abs: 4 10*3/uL (ref 1.7–7.7)
Neutro Abs: 4 10*3/uL (ref 1.7–7.7)
Neutrophils Relative %: 60 %
Neutrophils Relative %: 68 %
Platelets: 325 10*3/uL (ref 150–400)
Platelets: 355 10*3/uL (ref 150–400)
RBC: 3.4 MIL/uL — ABNORMAL LOW (ref 3.87–5.11)
RBC: 3.7 MIL/uL — ABNORMAL LOW (ref 3.87–5.11)
RDW: 13.9 % (ref 11.5–15.5)
RDW: 13.9 % (ref 11.5–15.5)
WBC: 5.9 10*3/uL (ref 4.0–10.5)
WBC: 6.5 10*3/uL (ref 4.0–10.5)
nRBC: 0 % (ref 0.0–0.2)
nRBC: 0.3 % — ABNORMAL HIGH (ref 0.0–0.2)

## 2020-09-02 LAB — COMPREHENSIVE METABOLIC PANEL
ALT: 18 U/L (ref 0–44)
ALT: 19 U/L (ref 0–44)
AST: 18 U/L (ref 15–41)
AST: 18 U/L (ref 15–41)
Albumin: 2.9 g/dL — ABNORMAL LOW (ref 3.5–5.0)
Albumin: 3 g/dL — ABNORMAL LOW (ref 3.5–5.0)
Alkaline Phosphatase: 47 U/L (ref 38–126)
Alkaline Phosphatase: 48 U/L (ref 38–126)
Anion gap: 7 (ref 5–15)
Anion gap: 8 (ref 5–15)
BUN: 6 mg/dL (ref 6–20)
BUN: 7 mg/dL (ref 6–20)
CO2: 22 mmol/L (ref 22–32)
CO2: 25 mmol/L (ref 22–32)
Calcium: 8.6 mg/dL — ABNORMAL LOW (ref 8.9–10.3)
Calcium: 8.7 mg/dL — ABNORMAL LOW (ref 8.9–10.3)
Chloride: 103 mmol/L (ref 98–111)
Chloride: 109 mmol/L (ref 98–111)
Creatinine, Ser: 0.55 mg/dL (ref 0.44–1.00)
Creatinine, Ser: 0.68 mg/dL (ref 0.44–1.00)
GFR, Estimated: >60 mL/min (ref >60)
GFR, Estimated: >60 mL/min (ref >60)
Glucose, Bld: 110 mg/dL — ABNORMAL HIGH (ref 70–99)
Glucose, Bld: 83 mg/dL (ref 70–99)
Potassium: 3.4 mmol/L — ABNORMAL LOW (ref 3.5–5.1)
Potassium: 3.6 mmol/L (ref 3.5–5.1)
Sodium: 135 mmol/L (ref 135–145)
Sodium: 139 mmol/L (ref 135–145)
Total Bilirubin: 0.2 mg/dL — ABNORMAL LOW (ref 0.3–1.2)
Total Bilirubin: 0.5 mg/dL (ref 0.3–1.2)
Total Protein: 6.4 g/dL — ABNORMAL LOW (ref 6.5–8.1)
Total Protein: 6.5 g/dL (ref 6.5–8.1)

## 2020-09-02 LAB — RESP PANEL BY RT-PCR (FLU A&B, COVID) ARPGX2
Influenza A by PCR: NEGATIVE
Influenza B by PCR: NEGATIVE
SARS Coronavirus 2 by RT PCR: NEGATIVE

## 2020-09-02 LAB — ABO/RH: ABO/RH(D): O POS

## 2020-09-02 LAB — ANTIBODY SCREEN: Antibody Screen: NEGATIVE

## 2020-09-02 LAB — NO BLOOD PRODUCTS

## 2020-09-02 MED ORDER — BUTALBITAL-APAP-CAFFEINE 50-325-40 MG PO TABS
1.0000 | ORAL_TABLET | Freq: Four times a day (QID) | ORAL | Status: DC | PRN
  Administered 2020-09-02 – 2020-09-03 (×3): 1 via ORAL
  Filled 2020-09-02 (×3): qty 1

## 2020-09-02 MED ORDER — NIFEDIPINE ER OSMOTIC RELEASE 30 MG PO TB24
30.0000 mg | ORAL_TABLET | Freq: Once | ORAL | Status: AC
  Administered 2020-09-02: 30 mg via ORAL
  Filled 2020-09-02: qty 1

## 2020-09-02 MED ORDER — DOCUSATE SODIUM 100 MG PO CAPS
100.0000 mg | ORAL_CAPSULE | Freq: Every day | ORAL | Status: DC
  Administered 2020-09-02 – 2020-09-03 (×2): 100 mg via ORAL
  Filled 2020-09-02 (×2): qty 1

## 2020-09-02 MED ORDER — ENSURE ENLIVE PO LIQD
237.0000 mL | Freq: Two times a day (BID) | ORAL | Status: DC
  Administered 2020-09-02 (×2): 237 mL via ORAL
  Filled 2020-09-02 (×6): qty 237

## 2020-09-02 MED ORDER — LACTATED RINGERS IV SOLN: INTRAVENOUS | Status: DC

## 2020-09-02 MED ORDER — NIFEDIPINE ER OSMOTIC RELEASE 30 MG PO TB24
30.0000 mg | ORAL_TABLET | Freq: Every day | ORAL | Status: DC
  Administered 2020-09-02 – 2020-09-03 (×2): 30 mg via ORAL
  Filled 2020-09-02 (×2): qty 1

## 2020-09-02 MED ORDER — CALCIUM CARBONATE ANTACID 500 MG PO CHEW: 2.0000 | CHEWABLE_TABLET | ORAL | Status: DC | PRN

## 2020-09-02 MED ORDER — ZOLPIDEM TARTRATE 5 MG PO TABS
5.0000 mg | ORAL_TABLET | Freq: Every evening | ORAL | Status: DC | PRN
  Administered 2020-09-02: 5 mg via ORAL
  Filled 2020-09-02: qty 1

## 2020-09-02 MED ORDER — ACETAMINOPHEN 325 MG PO TABS
650.0000 mg | ORAL_TABLET | ORAL | Status: DC | PRN
  Administered 2020-09-02 (×3): 650 mg via ORAL
  Filled 2020-09-02 (×3): qty 2

## 2020-09-02 MED ORDER — IBUPROFEN 600 MG PO TABS
600.0000 mg | ORAL_TABLET | Freq: Four times a day (QID) | ORAL | Status: DC | PRN
  Administered 2020-09-02 – 2020-09-03 (×3): 600 mg via ORAL
  Filled 2020-09-02 (×3): qty 1

## 2020-09-02 MED ORDER — MAGNESIUM SULFATE BOLUS VIA INFUSION
4.0000 g | Freq: Once | INTRAVENOUS | Status: AC
  Administered 2020-09-02: 4 g via INTRAVENOUS
  Filled 2020-09-02: qty 1000

## 2020-09-02 MED ORDER — MAGNESIUM SULFATE 40 GM/1000ML IV SOLN
2.0000 g/h | INTRAVENOUS | Status: AC
  Administered 2020-09-02 (×2): 2 g/h via INTRAVENOUS
  Filled 2020-09-02: qty 1000
  Filled 2020-09-02: qty 2000

## 2020-09-02 MED ORDER — PRENATAL MULTIVITAMIN CH
1.0000 | ORAL_TABLET | Freq: Every day | ORAL | Status: DC
  Administered 2020-09-02 – 2020-09-03 (×2): 1 via ORAL
  Filled 2020-09-02 (×2): qty 1

## 2020-09-02 MED ORDER — SIMETHICONE 80 MG PO CHEW
80.0000 mg | CHEWABLE_TABLET | Freq: Four times a day (QID) | ORAL | Status: DC | PRN
  Administered 2020-09-02: 80 mg via ORAL
  Filled 2020-09-02: qty 1

## 2020-09-02 NOTE — H&P (Signed)
History     CSN: [id number]  Arrival date and time: 09/01/20 2251   Event Date/Time   First Provider Initiated Contact with Patient 09/01/20 2351      Chief Complaint  Patient presents with  . Headache  . Hypertension   HPI  Colleen Fields is a 30 y.o. female G3P3003 status post vaginal delivery on 2/12; she was induced 2/2 GDM. She reports new onset HA last night which was unrelieved by ibuprofen and tylenol. She reports worsening of HA throughout today and the feeling of her left eye is blurred and not clear.  She denies any history of Ghtn and is unsure why it was listed on her problem list. She has had no issues with her BP in any of her other pregnancies.   OB History    Gravida  3   Para  3   Term  3   Preterm  0   AB      Living  3     SAB      IAB      Ectopic      Multiple  0   Live Births  3           Past Medical History:  Diagnosis Date  . Chest pain   . Depression   . Gestational diabetes    insulin  . Migraine   . Obesity 07/22/2017  . Palpitations   . Tachycardia     Past Surgical History:  Procedure Laterality Date  . CESAREAN SECTION    . WISDOM TOOTH EXTRACTION      Family History  Problem Relation Age of Onset  . Hypertension Mother   . Bipolar disorder Father   . Schizophrenia Father     Social History   Tobacco Use  . Smoking status: Never Smoker  . Smokeless tobacco: Never Used  Vaping Use  . Vaping Use: Never used  Substance Use Topics  . Alcohol use: No  . Drug use: No    Types: Marijuana    Comment: "hasn't smoked in forever"    Allergies: No Known Allergies  Medications Prior to Admission  Medication Sig Dispense Refill Last Dose  . acetaminophen (TYLENOL) 325 MG tablet Take 3 tablets (975 mg total) by mouth every 6 (six) hours as needed.     . calcium carbonate (TUMS - DOSED IN MG ELEMENTAL CALCIUM) 500 MG chewable tablet Chew 3 tablets by mouth daily as needed for indigestion or heartburn.      . ferrous sulfate (FERROUSUL) 325 (65 FE) MG tablet Take 1 tablet (325 mg total) by mouth every other day. 30 tablet 3   . ibuprofen (ADVIL) 600 MG tablet Take 1 tablet (600 mg total) by mouth every 6 (six) hours as needed. 30 tablet 0   . norethindrone (ORTHO MICRONOR) 0.35 MG tablet Take 1 tablet (0.35 mg total) by mouth daily. Initiate at 4 weeks postpartum 28 tablet 11   . Prenatal Vit-Fe Fumarate-FA (PRENATAL MULTIVITAMIN) TABS tablet Take 1 tablet by mouth daily at 12 noon.      Results for orders placed or performed during the hospital encounter of 09/01/20 (from the past 48 hour(s))  CBC with Differential/Platelet     Status: Abnormal   Collection Time: 09/01/20 11:44 PM  Result Value Ref Range   WBC 5.9 4.0 - 10.5 K/uL   RBC 3.40 (L) 3.87 - 5.11 MIL/uL   Hemoglobin 10.4 (L) 12.0 - 15.0 g/dL   HCT 40.9 (L)  36.0 - 46.0 %   MCV 88.8 80.0 - 100.0 fL   MCH 30.6 26.0 - 34.0 pg   MCHC 34.4 30.0 - 36.0 g/dL   RDW 48.5 46.2 - 70.3 %   Platelets 325 150 - 400 K/uL   nRBC 0.3 (H) 0.0 - 0.2 %   Neutrophils Relative % 68 %   Neutro Abs 4.0 1.7 - 7.7 K/uL   Lymphocytes Relative 23 %   Lymphs Abs 1.3 0.7 - 4.0 K/uL   Monocytes Relative 7 %   Monocytes Absolute 0.4 0.1 - 1.0 K/uL   Eosinophils Relative 1 %   Eosinophils Absolute 0.1 0.0 - 0.5 K/uL   Basophils Relative 0 %   Basophils Absolute 0.0 0.0 - 0.1 K/uL   Immature Granulocytes 1 %   Abs Immature Granulocytes 0.06 0.00 - 0.07 K/uL    Comment: Performed at [facility], [address] [place], [place] [address]  Comprehensive metabolic panel     Status: Abnormal   Collection Time: 09/01/20 11:44 PM  Result Value Ref Range   Sodium 135 135 - 145 mmol/L   Potassium 3.4 (L) 3.5 - 5.1 mmol/L   Chloride 103 98 - 111 mmol/L   CO2 25 22 - 32 mmol/L   Glucose, Bld 110 (H) 70 - 99 mg/dL    Comment: Glucose reference range applies only to samples taken after fasting for at least 8 hours.   BUN 7 6 - 20 mg/dL   Creatinine, Ser  8.18 0.44 - 1.00 mg/dL   Calcium 8.7 (L) 8.9 - 10.3 mg/dL   Total Protein 6.4 (L) 6.5 - 8.1 g/dL   Albumin 2.9 (L) 3.5 - 5.0 g/dL   AST 18 15 - 41 U/L   ALT 18 0 - 44 U/L   Alkaline Phosphatase 47 38 - 126 U/L   Total Bilirubin 0.5 0.3 - 1.2 mg/dL   GFR, Estimated >29 >93 mL/min    Comment: (NOTE) Calculated using the CKD-EPI Creatinine Equation (2021)    Anion gap 7 5 - 15    Comment: Performed at [facility], [address], [place], [place] [address]   Review of Systems  Constitutional: Negative for fever.  Eyes: Positive for photophobia and visual disturbance.  Gastrointestinal: Negative for abdominal pain.  Genitourinary: Positive for vaginal bleeding.  Neurological: Positive for headaches. Negative for dizziness and facial asymmetry.   Physical Exam   Blood pressure (!) 159/89, pulse 80, temperature 98.7 F (37.1 C), temperature source Oral, resp. rate 17, height 4\' 11"  (1.499 m), weight 109.3 kg, last menstrual period 12/07/2019, SpO2 99 %, unknown if currently breastfeeding.  Patient Vitals for the past 24 hrs:  BP Temp Temp src Pulse Resp SpO2 Height Weight  09/02/20 0100 131/78 -- -- 67 -- 100 % -- --  09/02/20 0045 (!) 149/79 -- -- 70 -- 100 % -- --  09/02/20 0030 (!) 144/78 -- -- 73 -- 100 % -- --  09/02/20 0015 140/80 -- -- 74 -- 100 % -- --  09/02/20 0000 (!) 149/79 -- -- 73 -- 100 % -- --  09/01/20 2346 (!) 159/89 -- -- 80 -- -- -- --  09/01/20 2332 (!) 153/91 -- -- 83 -- -- -- --  09/01/20 2312 (!) 166/94 98.7 F (37.1 C) Oral 78 17 99 % 4\' 11"  (1.499 m) 109.3 kg    Physical Exam Constitutional:      General: She is not in acute distress.  Appearance: She is well-developed. She is obese. She is not ill-appearing.  Eyes:     Extraocular Movements: Extraocular movements intact.     Right eye: Normal extraocular motion.     Left eye: Normal extraocular motion.     Pupils: Pupils are equal, round, and reactive to light. Pupils are equal.   Musculoskeletal:        General: Normal range of motion.  Neurological:     General: No focal deficit present.     Mental Status: She is alert and oriented to person, place, and time.     Motor: Motor function is intact.     Coordination: Coordination is intact.     Deep Tendon Reflexes: Reflexes are normal and symmetric. Reflexes normal.     Comments: Negative clonus   Psychiatric:        Behavior: Behavior normal.   Assessment and Plan   A:  1. Pre-eclampsia, severe, delivered with postpartum condition     P:  Admit to [facility]  Magnesium ordered Continue procardia  Will repeat labs   Colleen Fields, Harolyn Rutherford, NP 09/02/2020 1:09 AM

## 2020-09-03 ENCOUNTER — Encounter (HOSPITAL_COMMUNITY): Payer: Self-pay | Admitting: Obstetrics & Gynecology

## 2020-09-03 ENCOUNTER — Encounter: Payer: Self-pay | Admitting: Lactation Services

## 2020-09-03 ENCOUNTER — Other Ambulatory Visit (HOSPITAL_COMMUNITY): Payer: Self-pay | Admitting: Obstetrics and Gynecology

## 2020-09-03 DIAGNOSIS — O1415 Severe pre-eclampsia, complicating the puerperium: Principal | ICD-10-CM

## 2020-09-03 MED ORDER — NIFEDIPINE ER 30 MG PO TB24: 30.0000 mg | ORAL_TABLET | Freq: Every day | ORAL | 2 refills | Status: DC

## 2020-09-03 MED ORDER — BUTALBITAL-APAP-CAFFEINE 50-325-40 MG PO TABS: 1.0000 | ORAL_TABLET | Freq: Four times a day (QID) | ORAL | 0 refills | Status: DC | PRN

## 2020-09-03 MED FILL — BUTALB-ACETAMIN-CAFF 50-325: 50-325-40 | 6 days supply | Qty: 24 | Fill #0

## 2020-09-03 MED FILL — NIFEdipine ER 30 MG TB24: 30 | 30 days supply | Qty: 30 | Fill #0

## 2020-09-03 NOTE — Progress Notes (Signed)
Teaching complete   Home with sister  [facility]  [address]   Pt has called them and states she will be doing this

## 2020-09-03 NOTE — Discharge Summary (Signed)
Physician Discharge Summary  Patient ID: Colleen Fields MRN: [id number] DOB/AGE: [birth year] 30 y.o.  Admit date: 09/01/2020 Discharge date: 09/03/2020  Admission Diagnoses:  Severe postpartum preeclampsia  Discharge Diagnoses:  Active Problems:   Severe pre-eclampsia, postpartum   Discharged Condition: good  Hospital Course: Patient admitted PPD 9 with report of headache unrelieved with medication.  Blood pressure noted to be severely elevated.  Pt was admitted for blood pressure control and magnesium sulfate.  Consults: None  Significant Diagnostic Studies: labs: CBC, CMP  Treatments: magnesium sulfate, procardia  Discharge Exam: Blood pressure 122/71, pulse (!) 106, temperature 98.5 F (36.9 C), temperature source Oral, resp. rate 18, height 4\' 11"  (1.499 m), weight 109.3 kg, last menstrual period 12/07/2019, SpO2 99 %, unknown if currently breastfeeding. General appearance: alert, cooperative, no distress and mildly obese Head: Normocephalic, without obvious abnormality, atraumatic Cardio: regular rate and rhythm GI: soft, non-tender; bowel sounds normal; no masses,  no organomegaly Extremities: Homans sign is negative, no sign of DVT  Disposition: Discharge disposition: 01-Home or Self Care       Discharge Instructions    Activity as tolerated   Complete by: As directed    Call MD for:  difficulty breathing, headache or visual disturbances   Complete by: As directed    Call MD for:  persistant dizziness or light-headedness   Complete by: As directed    Call MD for:  temperature >100.4   Complete by: As directed    Diet - low sodium heart healthy   Complete by: As directed    Lifting restrictions   Complete by: As directed    Weight restriction of 15 lbs.   Sexual activity   Complete by: As directed    Pelvic rest 4-6 weeks     Allergies as of 09/03/2020   No Known Allergies     Medication List    TAKE these medications   acetaminophen 325 MG  tablet Commonly known as: Tylenol Take 3 tablets (975 mg total) by mouth every 6 (six) hours as needed.   butalbital-acetaminophen-caffeine 50-325-40 MG tablet Commonly known as: FIORICET Take 1 tablet by mouth every 6 (six) hours as needed for headache.   calcium carbonate 500 MG chewable tablet Commonly known as: TUMS - dosed in mg elemental calcium Chew 3 tablets by mouth daily as needed for indigestion or heartburn.   ferrous sulfate 325 (65 FE) MG tablet Commonly known as: FerrouSul Take 1 tablet (325 mg total) by mouth every other day.   ibuprofen 600 MG tablet Commonly known as: ADVIL Take 1 tablet (600 mg total) by mouth every 6 (six) hours as needed.   NIFEdipine 30 MG 24 hr tablet Commonly known as: ADALAT CC Take 1 tablet (30 mg total) by mouth daily. Start taking on: September 04, 2020   norethindrone 0.35 MG tablet Commonly known as: Ortho Micronor Take 1 tablet (0.35 mg total) by mouth daily. Initiate at 4 weeks postpartum   prenatal multivitamin Tabs tablet Take 1 tablet by mouth daily at 12 noon.       Follow-up Information    [facility] Follow up in 1 week(s).   Specialty: Obstetrics and Gynecology Why: blood pressure check, s/p postpartum readmit Contact information: [address] [place] [phone]              Signed: [phone] 09/03/2020, 3:52 PM

## 2020-09-03 NOTE — Discharge Instructions (Signed)
Postpartum Hypertension Postpartum hypertension is high blood pressure that is higher than normal after childbirth. It usually starts within 1 to 2 days after delivery, but it can happen at any time for up to 6 weeks after delivery. For some women, medical treatment is required to prevent serious complications, such as seizures or stroke. What are the causes? The cause of this condition is not well understood. In some cases, the cause may not be known. Certain conditions may increase your risk. These include:  Hypertension that existed before pregnancy (chronic hypertension).  Hypertension that comes as a result of pregnancy (gestational hypertension).  Hypertensive disorders during pregnancy or seizures in women who have high blood pressure during pregnancy. These conditions are called preeclampsia and eclampsia.  A condition in which the liver, platelets, and red blood cells are damaged during pregnancy (HELLP syndrome).  Obesity.  Diabetes. What are the signs or symptoms? As with all types of hypertension, postpartum hypertension may not have any symptoms. Depending on how high your blood pressure is, you may experience:  Headaches. These may be mild, moderate, or severe. They may also be steady, constant, or sudden in onset (thunderclap headache).  Vision changes, such as blurry vision, flashing lights, or seeing spots.  Nausea and vomiting.  Pain in the upper right side of your abdomen.  Shortness of breath.  Difficulty breathing while lying down.  A decrease in the amount of urine that you pass. How is this diagnosed? This condition may be diagnosed based on the results of a physical exam, blood pressure measurements, and blood and urine tests. You may also have other tests, such as a CT scan or an MRI, to check for other problems of postpartum hypertension. How is this treated? If blood pressure is high enough to require treatment, your options may include:  Medicines to  reduce blood pressure (antihypertensives). Tell your health care provider if you are breastfeeding or if you plan to breastfeed. There are many antihypertensive medicines that are safe to take while breastfeeding.  Treating medical conditions that are causing hypertension.  Treating the complications of hypertension, such as seizures, stroke, or kidney problems. Your health care provider will also continue to monitor your blood pressure closely until it is within a safe range for you. Follow these instructions at home: Learn your goal blood pressure Two numbers make up your blood pressure. The first number is called systolic pressure. The second is called diastolic pressure. An example of a blood pressure reading is "120 over 80" (or 120/80). For most people, goal blood pressure is:  First number: below 140.  Second number: below 90. Your blood pressure is above normal even if only the top or bottom number is above normal. Know what to do before you take your blood pressure 30 minutes before you check your blood pressure:  Do not drink caffeine.  Do not drink alcohol.  Avoid food and drink.  Do not smoke.  Do not exercise. 5 minutes before you check your blood pressure:  Use the bathroom and urinate so that you have an empty bladder.  Sit quietly in a dining room chair. Do not sit in a soft couch or an armchair. Do not talk. Know how to take your blood pressure To check your blood pressure, follow the instructions in the manual that came with your blood pressure monitor. If you have a digital blood pressure monitor, the instructions may be as follows: 1. Sit up straight. 2. Place your feet on the floor. Do   not cross your ankles or legs. 3. Rest your left arm at the level of your heart. You may rest it on a table, desk, or chair. 4. Pull up your shirt sleeve. 5. Wrap the blood pressure cuff around the upper part of your left arm. The cuff should be 1 inch (2.5 cm) above your  elbow. It is best to wrap the cuff around bare skin. 6. Fit the cuff snugly around your arm. You should be able to place only one finger between the cuff and your arm. 7. Put the cord inside the groove of your elbow. 8. Press the power button. 9. Sit quietly while the cuff fills with air and loses air. 10. Write down the numbers on the screen. These are your blood pressure readings. 11. Wait 1-2 minutes and then repeat steps 1-10.   Record your blood pressure readings Follow your health care provider's instructions on how to record your blood pressure readings. If you were asked to use this form, follow these instructions:  Get one reading in the morning (a.m.) before you take any medicines.  Get one reading in the evening (p.m.) before supper.  Take at least 2 readings with each blood pressure check. This makes sure the results are correct. Wait 1-2 minutes between measurements.  Write down the results in the spaces on this form. Date: _______________________  a.m. _____________________(1st reading) _____________________(2nd reading)  p.m. _____________________(1st reading) _____________________(2nd reading) Date: _______________________  a.m. _____________________(1st reading) _____________________(2nd reading)  p.m. _____________________(1st reading) _____________________(2nd reading) Date: _______________________  a.m. _____________________(1st reading) _____________________(2nd reading)  p.m. _____________________(1st reading) _____________________(2nd reading) Date: _______________________  a.m. _____________________(1st reading) _____________________(2nd reading)  p.m. _____________________(1st reading) _____________________(2nd reading) Date: _______________________  a.m. _____________________(1st reading) _____________________(2nd reading)  p.m. _____________________(1st reading) _____________________(2nd reading) General instructions  Take over-the-counter and  prescription medicines only as told by your health care provider.  Do not use any products that contain nicotine or tobacco. These products include cigarettes, chewing tobacco, and vaping devices, such as e-cigarettes. If you need help quitting, ask your health care provider.  Check your blood pressure as often as recommended by your health care provider.  Return to your normal activities as told by your health care provider. Ask your health care provider what activities are safe for you.  Keep all follow-up visits. This is important. Contact a health care provider if:  You have new symptoms, such as: ? A headache that does not get better. ? Dizziness. ? Visual changes. ? Nausea and vomiting. Get help right away if:  You develop difficulty breathing.  You have chest pain.  You faint.  You have any symptoms of a stroke. "BE FAST" is an easy way to remember the main warning signs of a stroke: ? B - Balance. Signs are dizziness, sudden trouble walking, or loss of balance. ? E - Eyes. Signs are trouble seeing or a sudden change in vision. ? F - Face. Signs are sudden weakness or numbness of the face, or the face or eyelid drooping on one side. ? A - Arms. Signs are weakness or numbness in an arm. This happens suddenly and usually on one side of the body. ? S - Speech. Signs are sudden trouble speaking, slurred speech, or trouble understanding what people say. ? T - Time. Time to call emergency services. Write down what time symptoms started.  You have other signs of a stroke, such as: ? A sudden, severe headache with no known cause. ? Nausea or vomiting. ? Seizure. These symptoms   may represent a serious problem that is an emergency. Do not wait to see if the symptoms will go away. Get medical help right away. Call your local emergency services (911 in the [place] Do not drive yourself to the hospital. Summary  Postpartum hypertension is high blood pressure that remains higher than  normal after childbirth.  For some women, medical treatment is required to prevent serious complications, such as seizures or stroke.  Follow your health care provider's instructions on how to record your blood pressure readings.  Keep all follow-up visits. This is important. This information is not intended to replace advice given to you by your health care provider. Make sure you discuss any questions you have with your health care provider. Document Revised: 03/26/2020 Document Reviewed: 03/26/2020 Elsevier Patient Education  2021 Elsevier Inc.  

## 2020-09-03 NOTE — Progress Notes (Signed)
Post Partum Day 9 with PP pre eclampsia with severe features Subjective: no complaints, up ad lib, voiding and tolerating PO Feels significantly better Objective: Blood pressure 127/68, pulse 90, temperature 98.2 F (36.8 C), temperature source Oral, resp. rate 17, height 4\' 11"  (1.499 m), weight 109.3 kg, last menstrual period 12/07/2019, SpO2 97 %, unknown if currently breastfeeding.  Vitals:   09/03/20 0300 09/03/20 0445 09/03/20 0600 09/03/20 0700  BP:  127/68    Pulse:  90    Resp: 17 16 17 17   Temp:  98.2 F (36.8 C)    TempSrc:  Oral    SpO2:  97%    Weight:      Height:         Physical Exam:  General: alert, cooperative and no distress Lochia: appropriate Uterine Fundus: firm Incision: n/a DVT Evaluation: No evidence of DVT seen on physical exam.  Recent Labs    09/01/20 2344 09/02/20 0148  HGB 10.4* 11.1*  HCT 30.2* 33.7*   CT HEAD WO CONTRAST  Result Date: 09/02/2020 CLINICAL DATA:  Migraine headache EXAM: CT HEAD WITHOUT CONTRAST TECHNIQUE: Contiguous axial images were obtained from the base of the skull through the vertex without intravenous contrast. COMPARISON:  CT head 06/25/2005 FINDINGS: Brain: Ventricle size and cerebral volume normal. Negative for acute or chronic infarct. Negative for hemorrhage Mild enlargement of the pituitary measuring approximately 9.3 mm in height with a convex superior border. This is difficult to evaluate on prior study due to lack of coronal and sagittal reconstructions . Vascular: Negative for hyperdense vessel Skull: Negative Sinuses/Orbits: Mild mucosal edema paranasal sinuses. Negative orbit Other: None IMPRESSION: Prominent pituitary approximately 9.3 mm in height. This could be pituitary hyperplasia or pituitary microadenoma. Follow-up MRI brain without and with contrast with pituitary protocol recommended. Otherwise normal. Electronically Signed   By: 09/04/2020 M.D.   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

## 2020-09-04 ENCOUNTER — Telehealth: Payer: Self-pay

## 2020-09-04 NOTE — Telephone Encounter (Signed)
Transition Care Management Follow-up Telephone Call  Date of discharge and from where: 09/03/2020 from [facility]  How have you been since you were released from the hospital? Pt states that she is overall feeling well today. No questions or concerns at this time.     Any questions or concerns? No  Items Reviewed:  Did the pt receive and understand the discharge instructions provided? Yes   Medications obtained and verified? Yes   Other? No   Any new allergies since your discharge? No   Dietary orders reviewed? N/A  Do you have support at home? Yes    Functional Questionnaire: (I = Independent and D = Dependent) ADLs: I  Bathing/Dressing- I  Meal Prep- I  Eating- I  Maintaining continence- I  Transferring/Ambulation- I  Managing Meds- I   Follow up appointments reviewed:   Specialist Hospital f/u appt confirmed? Yes  Scheduled to see [facility] Clinician on 09/09/2020 @ 08:15am.  Are transportation arrangements needed? No   If their condition worsens, is the pt aware to call PCP or go to the Emergency Dept.? Yes  Was the patient provided with contact information for the PCP's office or ED? Yes  Was to pt encouraged to call back with questions or concerns? Yes

## 2020-09-09 ENCOUNTER — Ambulatory Visit (INDEPENDENT_AMBULATORY_CARE_PROVIDER_SITE_OTHER): Payer: No Typology Code available for payment source | Admitting: Clinical

## 2020-09-09 DIAGNOSIS — Z658 Other specified problems related to psychosocial circumstances: Secondary | ICD-10-CM | POA: Diagnosis not present

## 2020-09-12 ENCOUNTER — Other Ambulatory Visit: Payer: Self-pay

## 2020-09-12 ENCOUNTER — Ambulatory Visit (INDEPENDENT_AMBULATORY_CARE_PROVIDER_SITE_OTHER): Payer: No Typology Code available for payment source | Admitting: Obstetrics and Gynecology

## 2020-09-12 VITALS — BP 126/91 | HR 108 | Wt 232.0 lb

## 2020-09-12 DIAGNOSIS — Z8669 Personal history of other diseases of the nervous system and sense organs: Secondary | ICD-10-CM

## 2020-09-12 HISTORY — DX: Personal history of other diseases of the nervous system and sense organs: Z86.69

## 2020-09-12 MED ORDER — MAGNESIUM MALATE 1250 (141.7 MG) MG PO TABS: 1.0000 | ORAL_TABLET | Freq: Every day | ORAL | Status: DC

## 2020-09-12 MED ORDER — MAGNESIUM OXIDE -MG SUPPLEMENT 400 (240 MG) MG PO TABS: 1.0000 | ORAL_TABLET | Freq: Every day | ORAL | Status: DC

## 2020-09-12 NOTE — Progress Notes (Signed)
Obstetrics and Gynecology Visit Return Patient Evaluation  Appointment Date: 09/12/2020  Primary Care Provider: Patient, [name]  OBGYN Clinic: [facility]  Chief Complaint: feels suture in vaginal area  History of Present Illness:  Colleen Fields is a 30 y.o. P3 s/p 2/12 SVD/1st repaired  Patient states she feels poking in the GU area  Patient has history of migraines outside of pregnancy needing shots at one point. She took the fioricet last night and a few times before but it doesn't help. She's had HA the entire time PP  Review of Systems: as noted in the History of Present Illness.    Patient Active Problem List   Diagnosis Date Noted  . Severe pre-eclampsia, postpartum 09/02/2020  . Blood transfusion declined because patient is Jehovah's Witness 08/24/2020  . Gestational hypertension 08/24/2020  . BMI 45.0-49.9, adult (HCC) 08/21/2020  . Noncompliant pregnant patient, antepartum 08/21/2020  . Group B streptococcal infection during pregnancy 08/20/2020  . Anemia of pregnancy in third trimester 06/27/2020  . Supervision of high-risk pregnancy 03/21/2020  . Obstructive sleep apnea on CPAP 02/28/2020  . VBAC (vaginal birth after Cesarean) 01/23/2018  . Anxiety and depression 12/09/2017  . GDM (gestational diabetes mellitus) 09/05/2017  . History of cesarean delivery followed by successful vaginal birth (VBAC) 07/22/2017  . Obesity in pregnancy 07/22/2017   Medications:  [name] had no medications administered during this visit. Current Outpatient Medications  Medication Sig Dispense Refill  . acetaminophen (TYLENOL) 325 MG tablet Take 3 tablets (975 mg total) by mouth every 6 (six) hours as needed.    . calcium carbonate (TUMS - DOSED IN MG ELEMENTAL CALCIUM) 500 MG chewable tablet Chew 3 tablets by mouth daily as needed for indigestion or heartburn.    . ferrous sulfate (FERROUSUL) 325 (65 FE) MG tablet Take 1 tablet  (325 mg total) by mouth every other day. 30 tablet 3  . ibuprofen (ADVIL) 600 MG tablet Take 1 tablet (600 mg total) by mouth every 6 (six) hours as needed. 30 tablet 0  . NIFEdipine (ADALAT CC) 30 MG 24 hr tablet Take 1 tablet (30 mg total) by mouth daily. 30 tablet 2  . norethindrone (ORTHO MICRONOR) 0.35 MG tablet Take 1 tablet (0.35 mg total) by mouth daily. Initiate at 4 weeks postpartum 28 tablet 11  . Prenatal Vit-Fe Fumarate-FA (PRENATAL MULTIVITAMIN) TABS tablet Take 1 tablet by mouth daily at 12 noon.    . butalbital-acetaminophen-caffeine (FIORICET) 50-325-40 MG tablet Take 1 tablet by mouth every 6 (six) hours as needed for headache. (Patient not taking: Reported on 09/12/2020) 24 tablet 0   No current facility-administered medications for this visit.    Allergies: has No Known Allergies.  Physical Exam:  BP (!) 126/91   Pulse (!) 108   Wt 232 lb (105.2 kg)   LMP 12/07/2019 (Exact Date)   BMI 46.86 kg/m  Body mass index is 46.86 kg/m. General appearance: Well nourished, well developed female in no acute distress.  Abdomen: diffusely non tender to palpation, non distended, and no masses, hernias Neuro/Psych:  Normal mood and affect.    Pelvic exam:  EGBUS: well healed. Suture knot and loop at the posterior fourcehtte that is ttp This was cut and the sutures cut as close to the skin as possible. A more distal knot was left in place   Assessment: pt doing well  Plan: *PP: suture irritation. F/u at her PP visit *Pre-eclampsia: pt states she took her  procardia today. Continue with it. I told her to stop it a few days before her PP visit to see what her BP is. This may be contributing to her HA *HA: recommend d/c fioricet (can cause rebound HAs) and will switch to qday Mg. No e/o pre-eclampsia currently   RTC: 10 days for PP visit  Cornelia Copa MD Attending Center [facility])

## 2020-09-23 ENCOUNTER — Encounter: Payer: Self-pay | Admitting: Nurse Practitioner

## 2020-09-23 ENCOUNTER — Other Ambulatory Visit: Payer: Self-pay

## 2020-09-23 ENCOUNTER — Other Ambulatory Visit: Payer: No Typology Code available for payment source

## 2020-09-23 ENCOUNTER — Ambulatory Visit (INDEPENDENT_AMBULATORY_CARE_PROVIDER_SITE_OTHER): Payer: No Typology Code available for payment source | Admitting: Nurse Practitioner

## 2020-09-23 DIAGNOSIS — Z8632 Personal history of gestational diabetes: Secondary | ICD-10-CM | POA: Diagnosis not present

## 2020-09-23 DIAGNOSIS — Z6841 Body Mass Index (BMI) 40.0 and over, adult: Secondary | ICD-10-CM | POA: Diagnosis not present

## 2020-09-23 DIAGNOSIS — Z8759 Personal history of other complications of pregnancy, childbirth and the puerperium: Secondary | ICD-10-CM

## 2020-09-23 DIAGNOSIS — O24419 Gestational diabetes mellitus in pregnancy, unspecified control: Secondary | ICD-10-CM

## 2020-09-23 DIAGNOSIS — Z8669 Personal history of other diseases of the nervous system and sense organs: Secondary | ICD-10-CM | POA: Diagnosis not present

## 2020-09-23 NOTE — Progress Notes (Signed)
Post Partum Visit Note  States stopped taking procardia Thursday as directed. C/o uterine soreness. ua obtained to rule out uti. Linda,RN  Colleen Fields is a 30 y.o. [id number] female who presents for a postpartum visit. She is 4 weeks postpartum following a normal spontaneous vaginal delivery.  I have fully reviewed the prenatal and intrapartum course. The delivery was at 37.2 gestational weeks - induction based on gestational diabetes and gestational hypertension.  Also had postpartum readmission for preeclampsia and MGSO4 IV.  Anesthesia: epidural. Postpartum course has been good. Baby is doing well. Baby is feeding by breast. Bleeding staining only. Bowel function is normal. Bladder function is normal. Patient is not sexually active. Contraception method is none. Postpartum depression screening: negative.   The pregnancy intention screening data noted above was reviewed. Potential methods of contraception were discussed. The patient elected to proceed with Oral Contraceptive. Progestin only pills.     The following portions of the patient's history were reviewed and updated as appropriate: allergies, current medications, past family history, past medical history, past social history, past surgical history and problem list.  Review of Systems Pertinent items noted in HPI and remainder of comprehensive ROS otherwise negative.    Objective:  LMP 12/07/2019 (Exact Date)   Vitals:   09/23/20 0900  BP: 133/89  Pulse: 84    General:  alert, cooperative and no distress   Breasts:  deferred  Lungs: clear to auscultation bilaterally  Heart:  regular rate and rhythm, S1, S2 normal, no murmur, click, rub or gallop  Abdomen:  not done   Vulva: Pelvic deferred  Vagina:   Cervix:    Corpus:   Adnexa:    Rectal Exam:         Assessment:    Normal postpartum exam. Pap smear not done at today's visit.  BMI 47 - has lost weight beyond prepregnancy weight Getting 2 hr glucola for  evaluation for gestational diabetes. Hx of preecclampsia   Plan:   Essential components of care per ACOG recommendations:  1.  Mood and well being: Patient with negative depression screening today. Reviewed local resources for support.  - Patient does not use tobacco.  - hx of drug use? No    2. Infant care and feeding:  -Patient currently breastmilk feeding? Yes  Reviewed importance of draining breast regularly to support lactation. -Social determinants of health (SDOH) reviewed in EPIC. No concerns  3. Sexuality, contraception and birth spacing - Patient does not want a pregnancy in the next year.  Desired family size is unknown number of children.  - Reviewed forms of contraception in tiered fashion. Patient desired oral progesterone-only contraceptive today.   - Discussed birth spacing of 18 months  4. Sleep and fatigue -Encouraged family/partner/community support of 4 hrs of uninterrupted sleep to help with mood and fatigue  5. Physical Recovery  - Discussed patients delivery and complications - Patient has urinary incontinence? No Does not have stress incontinence but does describe leaking of urine when bladder is full.  Will try to schedule times to empty her bladder and see if is a problem of an overfull bladder and if it resolves.  Informed of possibility of pelvic PT if leaking continues to be a problem. - Patient is safe to resume physical and sexual activity  Has birth control pills she has not started - micronor.  Plans to start soon - was worried about decreasing her milk supply.  Advised this is progesterone only pill and not  expecting it to decrease her milk supply. -Does describe that cleaning her house - mopping, vacuuming, and sweeping - causes pain and she has to rest in bed the remainder of the day due to body aches.  Discussed exercise as a way to strengthen her muscles and asking for additional help with these chores.Does not think her husband will help due to  cultural belief systems - "he is a manly man"  6.  Health Maintenance - Last pap smear done 03-2020 and was normal with negative HPV.  7. Chronic Disease - PCP follow up - referral sent for [facility] Needs follow up with hypertension remaining after childbirth - BP is not below 130/80 so advised to continue Procardia. Will watch for glucola results today.    Center for [employer], [facility]  Nolene Bernheim, RN, MSN, NP-BC Nurse Practitioner, [facility] for [employer], [facility] 09/23/2020 9:57 AM

## 2020-09-24 ENCOUNTER — Encounter: Payer: Self-pay | Admitting: Obstetrics and Gynecology

## 2020-09-24 LAB — GLUCOSE TOLERANCE, 2 HOURS
Glucose, 2 hour: 128 mg/dL (ref 65–139)
Glucose, GTT - Fasting: 91 mg/dL (ref 65–99)

## 2020-09-24 LAB — POCT URINALYSIS DIP (DEVICE)
Bilirubin Urine: NEGATIVE
Glucose, UA: NEGATIVE mg/dL
Hgb urine dipstick: NEGATIVE
Ketones, ur: NEGATIVE mg/dL
Leukocytes,Ua: NEGATIVE
Nitrite: NEGATIVE
Protein, ur: NEGATIVE mg/dL
Specific Gravity, Urine: >=1.03 (ref 1.005–1.030)
Urobilinogen, UA: 0.2 mg/dL (ref 0.0–1.0)
pH: 6 (ref 5.0–8.0)

## 2020-10-09 ENCOUNTER — Encounter: Payer: Self-pay | Admitting: General Practice

## 2021-02-07 ENCOUNTER — Other Ambulatory Visit (HOSPITAL_COMMUNITY): Payer: Self-pay

## 2021-02-12 ENCOUNTER — Other Ambulatory Visit (HOSPITAL_COMMUNITY): Payer: Self-pay

## 2021-02-18 ENCOUNTER — Other Ambulatory Visit (HOSPITAL_COMMUNITY): Payer: Self-pay

## 2021-10-02 ENCOUNTER — Ambulatory Visit: Payer: Self-pay | Admitting: Surgery

## 2021-10-06 ENCOUNTER — Other Ambulatory Visit (HOSPITAL_COMMUNITY): Payer: Self-pay | Admitting: General Surgery

## 2021-10-06 DIAGNOSIS — R12 Heartburn: Secondary | ICD-10-CM

## 2021-10-06 DIAGNOSIS — D649 Anemia, unspecified: Secondary | ICD-10-CM

## 2021-10-06 DIAGNOSIS — R7303 Prediabetes: Secondary | ICD-10-CM

## 2021-10-06 DIAGNOSIS — R519 Headache, unspecified: Secondary | ICD-10-CM

## 2021-10-06 DIAGNOSIS — Z9189 Other specified personal risk factors, not elsewhere classified: Secondary | ICD-10-CM

## 2021-10-06 DIAGNOSIS — E782 Mixed hyperlipidemia: Secondary | ICD-10-CM

## 2021-10-06 DIAGNOSIS — E236 Other disorders of pituitary gland: Secondary | ICD-10-CM

## 2021-10-12 ENCOUNTER — Ambulatory Visit (HOSPITAL_COMMUNITY): Admission: RE | Admit: 2021-10-12 | Payer: No Typology Code available for payment source | Source: Ambulatory Visit

## 2021-10-12 ENCOUNTER — Encounter (HOSPITAL_COMMUNITY): Payer: Self-pay

## 2021-11-12 ENCOUNTER — Encounter: Payer: No Typology Code available for payment source | Attending: General Surgery | Admitting: Skilled Nursing Facility1

## 2021-11-12 DIAGNOSIS — G8929 Other chronic pain: Secondary | ICD-10-CM | POA: Insufficient documentation

## 2021-11-12 DIAGNOSIS — D649 Anemia, unspecified: Secondary | ICD-10-CM | POA: Diagnosis not present

## 2021-11-12 DIAGNOSIS — Z9189 Other specified personal risk factors, not elsewhere classified: Secondary | ICD-10-CM | POA: Diagnosis not present

## 2021-11-12 DIAGNOSIS — E782 Mixed hyperlipidemia: Secondary | ICD-10-CM | POA: Diagnosis present

## 2021-11-12 DIAGNOSIS — Z6841 Body Mass Index (BMI) 40.0 and over, adult: Secondary | ICD-10-CM | POA: Diagnosis not present

## 2021-11-12 DIAGNOSIS — R12 Heartburn: Secondary | ICD-10-CM | POA: Insufficient documentation

## 2021-11-12 DIAGNOSIS — R519 Headache, unspecified: Secondary | ICD-10-CM | POA: Diagnosis not present

## 2021-11-12 DIAGNOSIS — E669 Obesity, unspecified: Secondary | ICD-10-CM

## 2021-11-12 DIAGNOSIS — R7303 Prediabetes: Secondary | ICD-10-CM | POA: Insufficient documentation

## 2021-11-12 DIAGNOSIS — Z713 Dietary counseling and surveillance: Secondary | ICD-10-CM | POA: Insufficient documentation

## 2021-11-12 NOTE — Progress Notes (Signed)
Nutrition Assessment for Bariatric Surgery ?Medical Nutrition Therapy ? ?Patient was seen on 11/12/2021 for Pre-Operative Nutrition Assessment. Letter of approval faxed to [facility] bariatric surgery program coordinator on 11/12/2021.  ? ?Referral stated Supervised Weight Loss (SWL) visits needed: 12 ? ?Planned surgery: sleeve gastrectomy  ?Pt expectation of surgery: to lose weight ?Pt expectation of dietitian: to help educate ? ?  ?NUTRITION ASSESSMENT ?  ?Anthropometrics  ?Start weight at [facility]: 257.3 lbs (date: 11/12/2021)  ?Height: 4'11'' in ?BMI: 51.95 kg/m2   ?  ?Clinical  ?Medical hx: N/A ?Medications: N/A  ?Labs: none update in EMR ?Notable signs/symptoms: migraines: sates she believes them to be work related stating she has them every day ?Any previous deficiencies? No ? ?Micronutrient Nutrition Focused Physical Exam: ?Hair: No issues observed ?Eyes: No issues observed ?Mouth: No issues observed ?Neck: No issues observed ?Nails: No issues observed ?Skin: No issues observed ? ?Lifestyle & Dietary Hx ? ? ?Pt states she did not have surgery originally when she went throughout the process the first time because she got pregnant.  ?Pt states since stopping a seasoning she was using she no longer has acid reflux.  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  ?Pt states she is getting a sleep study this month.  ?Pt states her mom is back in forth between here and [place].  ?Pt states she is currently breast feeding and has been since February 2022. Pt states she skips about half of her meals. Pt states she will register fullness but does not wish to listen to it.  ? ?Pt states she Thought if she overate she will pop a stitch after having the bariatric surgery. Pt states she has the thinking "I am going to get the surgeyr so I do not need to stop myself at fullness". Pt states she needed to feel free with no accountabliltiy or Responsbibility so she recognizes that she is  eating past fullness but doe snot wish to stop for the feeling of no responsibility due to feeling so strict/having a lot of responsibility in other parts of her life.  ? ?Pt states she thinks more in the moment and does not consider or want to consider the long term effects of her in the moment decisions with diet and lifestyle (discussions of instant gratification verses delayed gratification). ? ?Pt states she does sometimes feel Judged for her foods or body.  ?Pt admits to a Lack of control over her portion sizes.  ?Pt states she feels she "Gets a pass" for a hard day at work with caloric foods ?Pt states the feelings of Shame and overfull goes away once it is passed it is forgotten and no need to make any changes.  ?Pt realizes by the end of the appointment she is Not thinking long term ? ?Pt states she is Not ready to work on eating throughout the day as it would be too stressful.  ? ?Insetad of buying choclate she will eat choclate covered nuts ? ?Pt states her kids do not eat meat. ? ? ?24-Hr Dietary Recall ?First Meal: skipped ?Snack:  ?Second Meal 3:40: homeade fish sandwich + cheese + raisins bread  ?Snack:  ?Third Meal: 4 street tacos ?Snack: 2 crabs ?Beverages: wine on the weekends 12.5 ounces per sitting X' 2, soda, water ?  ?Estimated Energy Needs ?Calories: 1500 ? ? ?NUTRITION DIAGNOSIS  ?Overweight/obesity ([place]-3.3) related to past poor dietary habits and physical inactivity as evidenced by patient w/  planned sleeve gastrectomy surgery following dietary guidelines for continued weight loss. ?  ? ?NUTRITION INTERVENTION  ?Nutrition counseling (C-1) and education (E-2) to facilitate bariatric surgery goals. ? ?Educated pt on micronutrient deficiencies post surgery and strategies to mitigate that risk ?  ?Pre-Op Goals Reviewed with the Patient ?Track food and beverage intake (pen and paper, MyFitness Pal, Baritastic app, etc.) ?Make healthy food choices while monitoring portion sizes ?Consume 3 meals  per day or try to eat every 3-5 hours ?Avoid concentrated sugars and fried foods ?Keep sugar & fat in the single digits per serving on food labels ?Practice CHEWING your food (aim for applesauce consistency) ?Practice not drinking 15 minutes before, during, and 30 minutes after each meal and snack ?Avoid all carbonated beverages (ex: soda, sparkling beverages)  ?Limit caffeinated beverages (ex: coffee, tea, energy drinks) ?Avoid all sugar-sweetened beverages (ex: regular soda, sports drinks)  ?Avoid alcohol  ?Aim for 64-100 ounces of FLUID daily (with at least half of fluid intake being plain water)  ?Aim for at least 60-80 grams of PROTEIN daily ?Look for a liquid protein source that contains ?15 g protein and ?5 g carbohydrate (ex: shakes, drinks, shots) ?Make a list of non-food related activities ?Physical activity is an important part of a healthy lifestyle so keep it moving! The goal is to reach 150 minutes of exercise per week, including cardiovascular and weight baring activity. ?Stop at fullness by putting smaller portions on your plate ? ?*Goals that are bolded indicate the pt would like to start working towards these ? ?Handouts Provided Include  ?Bariatric Surgery handouts (Nutrition Visits, Pre-Op Goals, Protein Shakes, Vitamins & Minerals) ? ?Learning Style & Readiness for Change ?Teaching method utilized: Visual & Auditory  ?Demonstrated degree of understanding via: Teach Back  ?Readiness Level: pre-contemplative ?Barriers to learning/adherence to lifestyle change: disordered eating ? ?RD's Notes for Next Visit ?Assess pts adherence to chosen goals ?  ?  ?MONITORING & EVALUATION ?Dietary intake, weekly physical activity, body weight, and pre-op goals reached at next nutrition visit.  ?  ?Next Steps  ?Patient is to follow up at [facility] for Pre-Op Class >2 weeks before surgery for further nutrition education.  ?

## 2021-11-19 ENCOUNTER — Encounter: Payer: No Typology Code available for payment source | Admitting: Skilled Nursing Facility1

## 2021-11-19 DIAGNOSIS — E669 Obesity, unspecified: Secondary | ICD-10-CM

## 2021-11-19 DIAGNOSIS — E782 Mixed hyperlipidemia: Secondary | ICD-10-CM | POA: Diagnosis not present

## 2021-11-19 NOTE — Progress Notes (Signed)
Supervised Weight Loss Visit ?Bariatric Nutrition Education ?Appt Start Time: 3:55    End Time: 4:20 ? ?NUTRITION ASSESSMENT ? ?1 of 12 SWL ? ?Planned surgery: sleeve gastrectomy  ? ?Anthropometrics  ?Start weight at [facility]: 257.3 lbs (date: 11/12/2021)  ?Height: 254.6 pounds ?BMI: 51.42 kg/m2   ?  ?Clinical  ?Medical hx: N/A ?Medications: N/A  ?Labs: none update in EMR ?Notable signs/symptoms: migraines: sates she believes them to be work related stating she has them every day ?Any previous deficiencies? No ? ?Lifestyle & Dietary Hx ? ?Pt states she has been working really hard and realized she was waiting too long without eating in the morning. Pt states it has been emotionally fine with eating more often throughout the day. Pt states she has been looking at calories.  ?Pt state she is so exhausted on breaks she takes naps.  ?Pt states she has not been drinking any juice.  ?Pt states she did find herself trying to convince herself she needs chocolate when her baby started to cry so stirred herself away. Pt states he recognizes she needs to make new pathways in her mind to make changes. Pt states she is fact driven and needs the fact behind a recommendation.  ? ?Estimated daily fluid intake:  oz ?Supplements:  ?Current average weekly physical activity: ADL's ? ?24-Hr Dietary Recall: wakes around 6am ?First Meal 8: chicken pot pie or 2 eggs + american cheese + 2 bacons ?Snack: dark chocolate almonds and adds salt to them ?Second Meal 12: fish sandwich + american cheese on hawaiin roll or pot pie ?Snack:  ?Third Meal: salad: tomatoes, cucumber, cucumber dressing, chicken fingers, iceburg lettuce or oxtails and lettuce ?Snack: dark chocolate almonds and adds salt to them ?Beverages: 2 glasses of wine, water ? ?Estimated Energy Needs ?Calories: 1500 ? ? ?NUTRITION DIAGNOSIS  ?Overweight/obesity ([place]-3.3) related to past poor dietary habits and physical inactivity as evidenced by patient w/ planned sleeve gastrectomy  surgery following dietary guidelines for continued weight loss. ? ? ?NUTRITION INTERVENTION  ?Nutrition counseling (C-1) and education (E-2) to facilitate bariatric surgery goals. ? ?Pre-Op Goals Progress & New Goals ?Continue: Stop at fullness by putting smaller portions on your plate ?Continue: getting consistent with changes already made ? ?Handouts Provided Include  ? ? ?Learning Style & Readiness for Change ?Teaching method utilized: Visual & Auditory  ?Demonstrated degree of understanding via: Teach Back  ?Readiness Level: preparation ?Barriers to learning/adherence to lifestyle change: none identified  ? ?RD's Notes for next Visit  ?Assess pts adherence to chosen goals ? ? ?MONITORING & EVALUATION ?Dietary intake, weekly physical activity, body weight, and pre-op goals  ? ?Next Steps  ?Patient is to return to [facility] in 1-2 weeks for next visit ?

## 2021-11-20 ENCOUNTER — Encounter: Payer: Self-pay | Admitting: Skilled Nursing Facility1

## 2021-11-26 ENCOUNTER — Encounter: Payer: Self-pay | Admitting: Certified Nurse Midwife

## 2021-11-26 ENCOUNTER — Ambulatory Visit (INDEPENDENT_AMBULATORY_CARE_PROVIDER_SITE_OTHER): Payer: No Typology Code available for payment source | Admitting: Certified Nurse Midwife

## 2021-11-26 VITALS — BP 137/93 | HR 103 | Wt 254.0 lb

## 2021-11-26 DIAGNOSIS — Z30430 Encounter for insertion of intrauterine contraceptive device: Secondary | ICD-10-CM | POA: Diagnosis not present

## 2021-11-26 DIAGNOSIS — M6289 Other specified disorders of muscle: Secondary | ICD-10-CM | POA: Diagnosis not present

## 2021-11-26 DIAGNOSIS — Z3202 Encounter for pregnancy test, result negative: Secondary | ICD-10-CM

## 2021-11-26 DIAGNOSIS — I10 Essential (primary) hypertension: Secondary | ICD-10-CM | POA: Diagnosis not present

## 2021-11-26 LAB — POCT PREGNANCY, URINE: Preg Test, Ur: NEGATIVE

## 2021-11-26 MED ORDER — PARAGARD INTRAUTERINE COPPER IU IUD
1.0000 | INTRAUTERINE_SYSTEM | Freq: Once | INTRAUTERINE | Status: AC
Start: 2021-11-26 — End: 2021-11-26
  Administered 2021-11-26: 1 via INTRAUTERINE

## 2021-11-26 MED ORDER — AMLODIPINE BESYLATE 5 MG PO TABS: 5.0000 mg | ORAL_TABLET | Freq: Every day | ORAL | Status: DC

## 2021-11-26 NOTE — Progress Notes (Addendum)
? ?ANNUAL EXAM ?Patient name: Colleen Fields MRN [id number]  Date of birth: 1990-08-01 ?Chief Complaint:   ?IUD Insertion ? ?History of Present Illness:   ?Colleen Fields is a 31 y.o. [phone] female being seen today for a routine annual exam.  ?Current complaints: Feeling "pressure" and "something coming out" when bearing down. Pt endorses complete bladder emptying and frequency but denies urgency. She has attempted Kegel exercises without improvement.  ? ?She desires ParaGard IUD placement today. Pt previously gotten pregnant on Oral contraception and felt that she "wasn't herself" with subdermal implant. She is continuing to breastfeed 1 year PP. Pt states LMP was "last week" and last intercourse was "over 1 month ago."    ? ?No LMP recorded. ?Negative UPT in the office today.  ? ?The pregnancy intention screening data noted above was reviewed. Potential methods of contraception were discussed. The patient elected to proceed with ParaGard.  ? ?Last pap 03/21/20. Results were: NILM w/ HRHPV negative. H/O abnormal pap: no ?Last mammogram: N/A. Not recommended at this time Family h/o breast cancer: no ?Last colonoscopy: N/A Not recommended at this time .  ? ?Review of Systems:   ?Pertinent items are noted in HPI ?Denies any headaches, blurred vision, fatigue, shortness of breath, chest pain, abdominal pain, abnormal vaginal discharge/itching/odor/irritation, problems with periods, bowel movements, urination, or intercourse unless otherwise stated above. ?Pertinent History Reviewed:  ?Reviewed past medical,surgical, social and family history.  ?Reviewed problem list, medications and allergies. ?Physical Assessment:  ? ?Vitals:  ? 11/26/21 1510 11/26/21 1537  ?BP: (!) 139/102 (!) 137/93  ?Pulse: 94 (!) 103  ?Weight: 254 lb (115.2 kg)   ?Body mass index is 51.3 kg/m?. ?   ?     Physical Examination:  ? General appearance - well appearing, and in no distress ? Mental status - alert, oriented to person, place, and  time ? Psych:  She has a normal mood and affect ? Skin - warm and dry, normal color, no suspicious lesions noted ? Chest - effort normal, all lung fields clear to auscultation bilaterally ? Heart - normal rate and regular rhythm ? Neck:  midline trachea, no thyromegaly or nodules ? Breasts - Deferred per pt request  ? Abdomen - soft, nontender, nondistended, no masses or organomegaly ? Pelvic - VULVA: normal appearing vulva with no masses, tenderness or lesions  VAGINA: normal appearing vagina with normal color and discharge, no lesions  CERVIX: normal appearing cervix without discharge or lesions, no CMT Concern for mild cystocele upon bearing down. Good muscle tone  ? Extremities:  No swelling or varicosities noted ? ?Chaperone present for exam ? ?Results for orders placed or performed in visit on 11/26/21 (from the past 24 hour(s))  ?Pregnancy, urine POC  ? Collection Time: 11/26/21  3:22 PM  ?Result Value Ref Range  ? Preg Test, Ur NEGATIVE NEGATIVE  ?  ?Assessment & Plan:  ?1) Well-Woman Exam ?- IUD placement today without difficulty.  ?- Follow up in 4-6 weeks for string check.  ? ?2) Pelvic Floor Dysfunction  ?- Concern for Mild cystocele  ?- Referral sent for Pelvic Floor Physical therapy.  ? ?3) Chronic Hypertension  ?- Elevated BPs in office today. 1 Year PP. Hx of PP PreE treated with Procardia.  ?- Consulted Dr. Elgie Congo and Dr. Dione Plover. ?- PO Amlodipine 5mg  sent [facility]  ?- Follow up with PCP.  ? ?Labs/procedures today: IUD Insertion Procedure Note ?Patient identified, informed consent performed, consent signed.   Discussed  risks of irregular bleeding, cramping, infection, malpositioning or misplacement of the IUD outside the uterus which may require further procedure such as laparoscopy. Also discussed >99% contraception efficacy, increased risk of ectopic pregnancy with failure of method.   Emphasized that this did not protect against STIs, condoms recommended during all sexual encounters. Time out  was performed.  Urine pregnancy test negative. ? ?Speculum placed in the vagina.  Cervix visualized.  Cleaned with Betadine x 2.    Uterus sounded to 9 cm. ParaGard IUD placed per manufacturer's recommendations.  Strings trimmed to 4 cm. Patient tolerated procedure well.  ? ?Patient was given post-procedure instructions.  She was advised to have backup contraception for one week.  Patient was also asked to check IUD strings periodically and follow up in 4 weeks for IUD check. ? ?Mammogram: @ 31yo, or sooner if problems ?Colonoscopy: @ 32yo, or sooner if problems ? ?Orders Placed This Encounter  ?Procedures  ? Ambulatory referral to Physical Therapy  ? Pregnancy, urine POC  ? ? ?Meds:  ?Meds ordered this encounter  ?Medications  ? paragard intrauterine copper IUD 1 each  ? amLODipine (NORVASC) tablet 5 mg  ? ? ?Follow-up: Return in about 4 weeks (around 12/24/2021) for String Check . ? ?Jacquiline Doe, CNM ?11/26/2021 ?5:20 PM ? ?

## 2021-12-01 ENCOUNTER — Encounter: Payer: Self-pay | Admitting: Certified Nurse Midwife

## 2021-12-01 MED ORDER — AMLODIPINE BESYLATE 5 MG PO TABS: 5.0000 mg | ORAL_TABLET | Freq: Every day | ORAL | 3 refills | Status: DC

## 2021-12-03 ENCOUNTER — Encounter: Payer: No Typology Code available for payment source | Attending: General Surgery | Admitting: Skilled Nursing Facility1

## 2021-12-03 ENCOUNTER — Encounter: Payer: Self-pay | Admitting: Skilled Nursing Facility1

## 2021-12-03 DIAGNOSIS — E669 Obesity, unspecified: Secondary | ICD-10-CM | POA: Diagnosis present

## 2021-12-03 NOTE — Progress Notes (Unsigned)
Supervised Weight Loss Visit Bariatric Nutrition Education Appt Start Time: 3:15    End Time:   NUTRITION ASSESSMENT  2 of 12 SWL  Planned surgery: sleeve gastrectomy   Anthropometrics  Start weight at [facility]: 257.3 lbs (date: 11/12/2021)  Height: 254.6 pounds BMI: 51.42 kg/m2     Clinical  Medical hx: N/A Medications: N/A  Labs: none update in EMR Notable signs/symptoms: migraines: sates she believes them to be work related stating she has them every day Any previous deficiencies? No  Lifestyle & Dietary Hx  Pt states she has been working really hard and realized she was waiting too long without eating in the morning. Pt states it has been emotionally fine with eating more often throughout the day. Pt states she has been looking at calories.  Pt state she is so exhausted on breaks she takes naps.  Pt states she has not been drinking any juice.  Pt states she did find herself trying to convince herself she needs chocolate when her baby started to cry so stirred herself away. Pt states he recognizes she needs to make new pathways in her mind to make changes. Pt states she is fact driven and needs the fact behind a recommendation.    Pt states her goal was to cut out chocolate and do chocolate covered almonds instead. Pt states from not having her food crutch she has been having really high blood pressure. Pt state she has been going to the park every day with her kids to help control stress. Pt states she might start taking her blood pressure at home.   Pt states she is struggling to eat dinner again. Struggles to control the amount of foods she eats around dinner time so does not want to eat antyihing worried about stomhing that looking to feel content has a lot fof rea will not beablt to control the amounts when she does intrduce other foods  Estimated daily fluid intake:  oz Supplements:  Current average weekly physical activity: ADL's  24-Hr Dietary Recall: wakes around 6am:  smoothie: pineapple, ginger, cucumber, spinach, cyanne pepper every day before breakfast First Meal 8: mini muffin Snack:  Second Meal 12: (made at home) fish sandwich + american cheese on hawaiin roll or pot pie or hummus and tomato Snack:  Third Meal:  Snack:  Beverages: 2 glasses of wine, water  Estimated Energy Needs Calories: 1500   NUTRITION DIAGNOSIS  Overweight/obesity ([place]-3.3) related to past poor dietary habits and physical inactivity as evidenced by patient w/ planned sleeve gastrectomy surgery following dietary guidelines for continued weight loss.   NUTRITION INTERVENTION  Nutrition counseling (C-1) and education (E-2) to facilitate bariatric surgery goals.  Pre-Op Goals Progress & New Goals Continue: Stop at fullness by putting smaller portions on your plate Continue: getting consistent with changes already made  Handouts Provided Include    Learning Style & Readiness for Change Teaching method utilized: Visual & Auditory  Demonstrated degree of understanding via: Teach Back  Readiness Level: preparation Barriers to learning/adherence to lifestyle change: none identified   RD's Notes for next Visit  Assess pts adherence to chosen goals   MONITORING & EVALUATION Dietary intake, weekly physical activity, body weight, and pre-op goals   Next Steps  Patient is to return to [facility] in 1-2 weeks for next visit

## 2021-12-10 ENCOUNTER — Ambulatory Visit (INDEPENDENT_AMBULATORY_CARE_PROVIDER_SITE_OTHER): Payer: No Typology Code available for payment source | Admitting: Neurology

## 2021-12-10 ENCOUNTER — Encounter: Payer: Self-pay | Admitting: Neurology

## 2021-12-10 VITALS — BP 147/91 | HR 75 | Ht 59.0 in | Wt 255.5 lb

## 2021-12-10 DIAGNOSIS — R519 Headache, unspecified: Secondary | ICD-10-CM | POA: Diagnosis not present

## 2021-12-10 DIAGNOSIS — R351 Nocturia: Secondary | ICD-10-CM | POA: Diagnosis not present

## 2021-12-10 DIAGNOSIS — Z9189 Other specified personal risk factors, not elsewhere classified: Secondary | ICD-10-CM | POA: Diagnosis not present

## 2021-12-10 DIAGNOSIS — Z6841 Body Mass Index (BMI) 40.0 and over, adult: Secondary | ICD-10-CM

## 2021-12-10 DIAGNOSIS — Z820 Family history of epilepsy and other diseases of the nervous system: Secondary | ICD-10-CM

## 2021-12-10 DIAGNOSIS — R0683 Snoring: Secondary | ICD-10-CM

## 2021-12-10 DIAGNOSIS — G4719 Other hypersomnia: Secondary | ICD-10-CM | POA: Diagnosis not present

## 2021-12-10 NOTE — Progress Notes (Signed)
Subjective:    Patient ID: Colleen Fields is a 31 y.o. female.  HPI    Huston Foley, MD, PhD [facility] [address], [address] [address] [place], [place] [address]  Dear Dr. Andrey Campanile,   I saw your patient, Colleen Fields, upon your kind request in my sleep clinic today for initial consultation of her sleep disorder, in particular, concern for underlying obstructive sleep apnea.  The patient is unaccompanied today.  As you know, Colleen Fields is a 31 year old right-handed woman with an underlying medical history of anemia, prediabetes, hyperlipidemia, enlarged pituitary gland, migraines, palpitations, history of preeclampsia, history of chest pain, depression, and morbid obesity with a BMI of 50, who reports snoring and excessive daytime somnolence, as well as recurrent headaches.  I reviewed your office note from 10/02/2021.  She is being considered for bariatric surgery, in particular laparoscopic sleeve gastrectomy.  Her Epworth sleepiness score is 18 out of 24, fatigue severity score is 60 out of 63.  Her sleep-related issues have become worse in the past couple of years, especially since her last pregnancy.  She continues to breast-feed her 75-year-old.  She lives with her family including husband, 54 year old, 60-year-old and 43-year-old.  She has taken a nap during the day, she works from home and takes a nap during her break.  She has been working as a IT trainer.  She does have a TV in her bedroom but turns it off at night.  She has no pets in the household, bedtime generally around 10 PM and she is typically asleep by 10:30 PM.  Rise time is between 5 and 6 AM.  She does not currently drink any caffeine and drinks alcohol in the form of wine, 1 glass on the weekend.  She has smoked marijuana in her 44s, not in the recent past.  She is a non-smoker of cigarettes and does not use any nicotine products.  Her mother has sleep apnea and maternal uncle has  sleep apnea as well.  She has woken up with a sense of choking and with morning headaches.  She has nocturia about 3 times per average night.  Her Past Medical History Is Significant For: Past Medical History:  Diagnosis Date   Chest pain    Depression    Gestational diabetes    insulin   History of gestational diabetes mellitus 09/05/2017   [ ]  qyear a1c  09/2020: passed 2h post partum GTT   History of severe pre-eclampsia 09/02/2020   Postpartum readmission for MGSO4   Migraine    Obesity 07/22/2017   Palpitations    Tachycardia     Her Past Surgical History Is Significant For: Past Surgical History:  Procedure Laterality Date   CESAREAN SECTION     WISDOM TOOTH EXTRACTION      Her Family History Is Significant For: Family History  Problem Relation Age of Onset   Hypertension Mother    Bipolar disorder Father    Schizophrenia Father    Sleep apnea Maternal Uncle     Her Social History Is Significant For: Social History   Socioeconomic History   Marital status: Married    Spouse name: Not on file   Number of children: Not on file   Years of education: Not on file   Highest education level: Not on file  Occupational History   Not on file  Tobacco Use   Smoking status: Never   Smokeless tobacco: Never  Vaping Use  Vaping Use: Never used  Substance and Sexual Activity   Alcohol use: No   Drug use: Not Currently    Types: Marijuana    Comment: "hasn't smoked in forever"   Sexual activity: Not Currently    Birth control/protection: None  Other Topics Concern   Not on file  Social History Narrative   Not on file   Social Determinants of Health   Financial Resource Strain: Not on file  Food Insecurity: Not on file  Transportation Needs: Not on file  Physical Activity: Not on file  Stress: Not on file  Social Connections: Not on file    Her Allergies Are:  No Known Allergies:   Her Current Medications Are:  Outpatient Encounter Medications as of  12/10/2021  Medication Sig   amLODipine (NORVASC) 5 MG tablet Take 1 tablet (5 mg total) by mouth daily.   Prenatal Vit-Fe Fumarate-FA (PRENATAL MULTIVITAMIN) TABS tablet Take 1 tablet by mouth daily at 12 noon.   acetaminophen (TYLENOL) 325 MG tablet Take 3 tablets (975 mg total) by mouth every 6 (six) hours as needed.   calcium carbonate (TUMS - DOSED IN MG ELEMENTAL CALCIUM) 500 MG chewable tablet Chew 3 tablets by mouth daily as needed for indigestion or heartburn.   ferrous sulfate (FERROUSUL) 325 (65 FE) MG tablet Take 1 tablet (325 mg total) by mouth every other day.   ibuprofen (ADVIL) 600 MG tablet Take 1 tablet (600 mg total) by mouth every 6 (six) hours as needed.   Magnesium Malate 1250 (141.7 Mg) MG TABS Take 1 tablet by mouth daily. You can do this or the Magnesium oxide. Choose whichever one is available at the pharmacy   Magnesium Oxide 400 (240 Mg) MG TABS Take 1 tablet (400 mg total) by mouth daily. You can do this or the Magnesium malate. Choose whichever one is available at the pharmacy   norethindrone (ORTHO MICRONOR) 0.35 MG tablet Take 1 tablet (0.35 mg total) by mouth daily. Initiate at 4 weeks postpartum (Patient not taking: Reported on 09/23/2020)   No facility-administered encounter medications on file as of 12/10/2021.  :   Review of Systems:  Out of a complete 14 point review of systems, all are reviewed and negative with the exception of these symptoms as listed below:   Review of Systems  Neurological:        Pt is here for sleep consult  Pt states she snores,headaches,fatigue.hypertension. Pt denies sleep study and CPAP at home   ESS:18 FSS :60   Objective:  Neurological Exam  Physical Exam Physical Examination:   Vitals:   12/10/21 1356  BP: (!) 147/91  Pulse: 75    General Examination: The patient is a very pleasant 31 y.o. female in no acute distress. She appears well-developed and well-nourished and well groomed.   HEENT: Normocephalic,  atraumatic, pupils are equal, round and reactive to light, extraocular tracking is good without limitation to gaze excursion or nystagmus noted. Hearing is grossly intact. Face is symmetric with normal facial animation. Speech is clear with no dysarthria noted. There is no hypophonia. There is no lip, neck/head, jaw or voice tremor. Neck is supple with full range of passive and active motion. There are no carotid bruits on auscultation. Oropharynx exam reveals: mild mouth dryness, good dental hygiene and moderate airway crowding, due to widened uvula, tonsils of 1-2+. Mallampati is class II. Tongue protrudes centrally and palate elevates symmetrically.  Neck circumference of 14 three-quarter inches.  Minimal overbite noted.  Chest: Clear to auscultation without wheezing, rhonchi or crackles noted.  Heart: S1+S2+0, regular and normal without murmurs, rubs or gallops noted.   Abdomen: Soft, non-tender and non-distended with normal bowel sounds appreciated on auscultation.  Extremities: There is no pitting edema in the distal lower extremities bilaterally.   Skin: Warm and dry without trophic changes noted.   Musculoskeletal: exam reveals no obvious joint deformities.   Neurologically:  Mental status: The patient is awake, alert and oriented in all 4 spheres. Her immediate and remote memory, attention, language skills and fund of knowledge are appropriate. There is no evidence of aphasia, agnosia, apraxia or anomia. Speech is clear with normal prosody and enunciation. Thought process is linear. Mood is normal and affect is normal.  Cranial nerves II - XII are as described above under HEENT exam.  Motor exam: Normal bulk, strength and tone is noted. There is no obvious tremor.  Fine motor skills and coordination: grossly intact.  Cerebellar testing: No dysmetria or intention tremor. There is no truncal or gait ataxia.  Sensory exam: intact to light touch in the upper and lower extremities.  Gait,  station and balance: She stands easily. No veering to one side is noted. No leaning to one side is noted. Posture is age-appropriate and stance is narrow based. Gait shows normal stride length and normal pace. No problems turning are noted.  Assessment and Plan:  In summary, Belinda Schlichting is a very pleasant 31 y.o.-year old female  with an underlying medical history of anemia, prediabetes, hyperlipidemia, enlarged pituitary gland, migraines, palpitations, history of preeclampsia, history of chest pain, depression, and morbid obesity with a BMI of 50, whose history and physical exam are concerning for obstructive sleep apnea (OSA). I had a long chat with the patient about my findings and the diagnosis of OSA, its prognosis and treatment options. We talked about medical treatments, surgical interventions and non-pharmacological approaches. I explained in particular the risks and ramifications of untreated moderate to severe OSA, especially with respect to developing cardiovascular disease down the Road, including congestive heart failure, difficult to treat hypertension, cardiac arrhythmias, or stroke. Even type 2 diabetes has, in part, been linked to untreated OSA. Symptoms of untreated OSA include daytime sleepiness, memory problems, mood irritability and mood disorder such as depression and anxiety, lack of energy, as well as recurrent headaches, especially morning headaches. We talked about trying to maintain a healthy lifestyle in general, as well as the importance of weight control. We also talked about the importance of good sleep hygiene. I recommended the following at this time: sleep study.  I outlined the differences between a laboratory attended sleep study versus home sleep testing.  I explained the sleep test procedure to the patient and also outlined possible surgical and non-surgical treatment options of OSA, including the use of a custom-made dental device (which would require a referral to  a specialist dentist or oral surgeon), upper airway surgical options, such as traditional UPPP or a novel less invasive surgical option in the form of Inspire hypoglossal nerve stimulation (which would involve a referral to an ENT surgeon). I also explained the CPAP treatment option to the patient, who indicated that she would be willing to try PAP therapy, if the need arises. I explained the importance of being compliant with PAP treatment, not only for insurance purposes but primarily to improve Her symptoms, and for the patient's long term health benefit, including to reduce Her cardiovascular risks. I answered all her questions today and  the patient was in agreement. I plan to see her back after the sleep study is completed and encouraged her to call with any interim questions, concerns, problems or updates.   Thank you very much for allowing me to participate in the care of this nice patient. If I can be of any further assistance to you please do not hesitate to call me at [phone].  Sincerely,   Huston FoleySaima Kaliyan Osbourn, MD, PhD

## 2021-12-10 NOTE — Patient Instructions (Signed)

## 2021-12-15 ENCOUNTER — Encounter: Payer: No Typology Code available for payment source | Attending: General Surgery | Admitting: Dietician

## 2021-12-15 ENCOUNTER — Encounter: Payer: Self-pay | Admitting: Dietician

## 2021-12-15 DIAGNOSIS — E669 Obesity, unspecified: Secondary | ICD-10-CM | POA: Insufficient documentation

## 2021-12-15 NOTE — Progress Notes (Unsigned)
Supervised Weight Loss Visit Bariatric Nutrition Education  NUTRITION ASSESSMENT  3 of 12 SWL  Planned surgery: sleeve gastrectomy   Anthropometrics  Start weight at [facility]: 257.3 lbs (date: 11/12/2021)  Height: 59" Weight: 253.6 lbs BMI: 51.22 kg/m2     Clinical  Medical hx: N/A Medications: N/A  Labs: none update in EMR Notable signs/symptoms: migraines: sates she believes them to be work related stating she has them every day Any previous deficiencies? No  Lifestyle & Dietary Hx  Pt states she has been working on eating more frequently with appropriate size portions. Pt stated she still needs some meal ideas for dinner.  Dietitian provided Meal Ideas handout. Pt created 3 dinner meal ideas from the Meal Ideas handout. Pt states she likes to keep her breakfast and lunch the same every day, stating it is easier for her that way. Pt states she has not been able to look at the mindful meal sheets given last visit. Pt states she has been working on physical activity walking every evening after dinner for about 3 miles.  Estimated daily fluid intake:  oz Supplements:  Current average weekly physical activity: Walking after dinner every night (about 3 miles)  24-Hr Dietary Recall... from previous visit:  wakes around 6am: smoothie: pineapple, ginger, cucumber, spinach, cyanne pepper every day before breakfast First Meal 8: mini muffin Snack:  Second Meal 12: (made at home) fish sandwich + american cheese on hawaiin roll or pot pie or hummus and tomato Snack:  Third Meal:  Snack:  Beverages: 2 glasses of wine, water  Estimated Energy Needs Calories: 1500   NUTRITION DIAGNOSIS  Overweight/obesity ([place]-3.3) related to past poor dietary habits and physical inactivity as evidenced by patient w/ planned sleeve gastrectomy surgery following dietary guidelines for continued weight loss.   NUTRITION INTERVENTION  Nutrition counseling (C-1) and education (E-2) to facilitate  bariatric surgery goals.  Pre-Op Goals Progress & New Goals Continue: Stop at fullness by putting smaller portions on your plate Continue: getting consistent with changes already made Re-engage/Continue: focus on those 3 meals for dinner on rotation until next visit Continue: chew each bite well Continue: fill out the mindful meals sheet New: Keep walking after dinner.  Handouts Provided Include  MyPlate Handout Meal Ideas Handout  Learning Style & Readiness for Change Teaching method utilized: Visual & Auditory  Demonstrated degree of understanding via: Teach Back  Readiness Level: preparation Barriers to learning/adherence to lifestyle change: none identified   RD's Notes for next Visit  Assess pts adherence to chosen goals Mindful Meal sheet   MONITORING & EVALUATION Dietary intake, weekly physical activity, body weight, and pre-op goals   Next Steps  Patient is to return to [facility] in 1-2 weeks for next visit

## 2021-12-23 ENCOUNTER — Telehealth: Payer: Self-pay | Admitting: Neurology

## 2021-12-23 NOTE — Telephone Encounter (Signed)
HST- [employer] no Berkley Harvey req spoke to [place] ref # [id number]. Patient is scheduled for 01/06/22 at 10:30 AM.

## 2021-12-24 ENCOUNTER — Ambulatory Visit: Payer: No Typology Code available for payment source | Attending: Certified Nurse Midwife

## 2021-12-24 DIAGNOSIS — R293 Abnormal posture: Secondary | ICD-10-CM | POA: Insufficient documentation

## 2021-12-24 DIAGNOSIS — M6281 Muscle weakness (generalized): Secondary | ICD-10-CM | POA: Diagnosis not present

## 2021-12-24 DIAGNOSIS — R279 Unspecified lack of coordination: Secondary | ICD-10-CM | POA: Insufficient documentation

## 2021-12-24 DIAGNOSIS — M62838 Other muscle spasm: Secondary | ICD-10-CM | POA: Insufficient documentation

## 2021-12-24 DIAGNOSIS — M6289 Other specified disorders of muscle: Secondary | ICD-10-CM | POA: Diagnosis not present

## 2021-12-24 NOTE — Therapy (Signed)
OUTPATIENT PHYSICAL THERAPY FEMALE PELVIC EVALUATION   Patient Name: Colleen Fields MRN: [id number] DOB:1991/06/23, 31 y.o., female Today's Date: 12/24/2021   PT End of Session - 12/24/21 1647     Visit Number 1    Date for PT Re-Evaluation 03/18/22    Authorization Type [employer]/Helathy Blue    PT Start Time 1625    PT Stop Time 1710    PT Time Calculation (min) 45 min    Activity Tolerance Patient tolerated treatment well    Behavior During Therapy [facility] for tasks assessed/performed             Past Medical History:  Diagnosis Date   Chest pain    Depression    Gestational diabetes    insulin   History of gestational diabetes mellitus 09/05/2017   [ ]  qyear a1c  09/2020: passed 2h post partum GTT   History of severe pre-eclampsia 09/02/2020   Postpartum readmission for MGSO4   Migraine    Obesity 07/22/2017   Palpitations    Tachycardia    Past Surgical History:  Procedure Laterality Date   CESAREAN SECTION     WISDOM TOOTH EXTRACTION     Patient Active Problem List   Diagnosis Date Noted   History of migraine 09/12/2020   Blood transfusion declined because patient is Jehovah's Witness 08/24/2020   BMI 45.0-49.9, adult (HCC) 08/21/2020   Obstructive sleep apnea on CPAP 02/28/2020   Anxiety and depression 12/09/2017    PCP: NA  REFERRING PROVIDER: 12/11/2017, CNM  REFERRING DIAG: M62.89 (ICD-10-CM) - Pelvic floor dysfunction in female  THERAPY DIAG:  Muscle weakness (generalized)  Unspecified lack of coordination  Other muscle spasm  Abnormal posture  Rationale for Evaluation and Treatment Rehabilitation  ONSET DATE: 2019  SUBJECTIVE 12/24/21:                                                                                                                                                                                       SUBJECTIVE STATEMENT: After delivery of first child in 2019 she started having urinary incontinence. After second  vaginal delivery in 2021, she feels like she has to urinate all the time and when she sits on the toilet she feels significant pressure and has to readjust when on toilet. Patient delivered third child on 08/24/20. 2 boys, one girl     Fluid intake: Yes: Almost a gallon of water a day     PAIN:  Are you having pain? Yes NPRS scale: 7/10 Pain location:  low back  Pain type: aching Pain description: constant   Aggravating factors: standing longer than 5 minutes - needs to  lean on cart Relieving factors: leaning forward with support  PRECAUTIONS: None  WEIGHT BEARING RESTRICTIONS No  FALLS:  Has patient fallen in last 6 months? No  LIVING ENVIRONMENT: Lives with: lives with their family Lives in: House/apartment   OCCUPATION: works from home, Patent examiner  PLOF: Independent  PATIENT GOALS get rid of pressure, help low back pain, leaking  PERTINENT HISTORY:  C-section and 2 vaginal deliveries Sexual abuse: No  BOWEL MOVEMENT Pain with bowel movement: No Type of bowel movement:Frequency 1-2x/day and Strain No Constipation started after cleanse at the beginning of the year Fully empty rectum: Yes: - Leakage: No Pads: No Fiber supplement: No  URINATION Pain with urination: No Fully empty bladder: Yes: unsure, has to urinate 30 minutes after she goes Stream:  WNL Urgency: Yes: - Frequency: 10x/day - in 2 hours, she will go 2-3 times Leakage: Urge to void, Walking to the bathroom, Coughing, Sneezing, Laughing, Exercise, Lifting, and Bending forward Pads: Yes: panty liners   does not always wear underwear to make it easier to get to the bathroom without leaking  INTERCOURSE Pain with intercourse: Initial Penetration - she feels like she has to brace at first Ability to have vaginal penetration:  Yes: - Climax: yes - unsure, feels like she may be leaking   PREGNANCY Vaginal deliveries 2 Tearing Yes: both- 1st/2nd degree C-section deliveries 1 Currently  pregnant No  PROLAPSE Sensation of vaginal bulging/pressure, bowling ball sitting in pelvis   OBJECTIVE 12/24/21:   PATIENT SURVEYS:   PFIQ-7: 75 (just urine/bladder)  COGNITION:  Overall cognitive status: Within functional limits for tasks assessed     SENSATION:  Light touch: Appears intact  Proprioception: Appears intact    GAIT:  Comments: Mild trendelenburg bil               POSTURE: anterior pelvic tilt      PALPATION:               External Perineal Exam WNL                             Internal Pelvic Floor significant tenderness in Bil deep pelvic floor layers; perineal scar tissue restriction; mild Lt superficial pelvic floor tenderness  Patient confirms identification and approves PT to assess internal pelvic floor and treatment Yes  PELVIC MMT:   Strength 3/5 Endurance 5 seconds Repeat contractions 10 - decreasing ability to fully relax in between       TONE: High  PROLAPSE: Grade 2 anterior vaginal wall laxity in supine  TODAY'S TREATMENT 12/24/21  EVAL  Neuromuscular re-education: Pelvic floor contraction training: For A/ROM, increased circulation, improved proprioception Down training: Diaphragmatic breathing/pelvic floor bulge Self-care: Vulvovaginal massage Intra-abdominal pressure management    PATIENT EDUCATION:  Education details: See above self-care/HEP Person educated: Patient Education method: Explanation, Demonstration, Tactile cues, Verbal cues, and Handouts Education comprehension: verbalized understanding   HOME EXERCISE PROGRAM: [id number]  ASSESSMENT:  CLINICAL IMPRESSION: Patient is a 31 y.o. female who was seen today for physical therapy evaluation and treatment for pelvic/vaginal pressure, low back pain, and urinary incontinence. Exam findings notable for pelvic floor weakness 3/5, decreased pelvic floor endurance 6 seconds, poor coordination with repeat contractions, high pelvic floor tone with pain upon palpation,  perineal scar tissue restriction, and grade 2 anterior vaginal wall laxity. Signs and symptoms are most consistent with high pelvic floor tone, anterior vaginal wall laxity, poor bladder habits, and impaired  abdominal pressure management. Initial treatment consisted of self-vulvovaginal massage, education on pressure management, pelvic floor contraction training for improved relaxation, and diaphragmatic breathing. She will benefit from skilled PT intervention in order to improve urinary symptoms, decrease low back pain, decrease pelvic/vaginal pressure, and improve QOL.    OBJECTIVE IMPAIRMENTS decreased activity tolerance, decreased coordination, decreased endurance, decreased mobility, decreased ROM, decreased strength, hypomobility, increased fascial restrictions, increased muscle spasms, impaired tone, postural dysfunction, and pain.   ACTIVITY LIMITATIONS lifting, bending, sleeping, and continence  PARTICIPATION LIMITATIONS: interpersonal relationship, community activity, and occupation  PERSONAL FACTORS 1 comorbidity: G3P3 (1 c-section, 2 vaginal deliveries with tearing)  are also affecting patient's functional outcome.   REHAB POTENTIAL: Good  CLINICAL DECISION MAKING: Stable/uncomplicated  EVALUATION COMPLEXITY: Low   GOALS: Goals reviewed with patient? Yes  SHORT TERM GOALS: Target date: 01/21/2022  Pt will be independent with HEP.   Baseline: Goal status: INITIAL  2.  Pt will be independent with the knack, urge suppression technique, and double voiding in order to improve bladder habits and decrease urinary incontinence.   Baseline:  Goal status: INITIAL  3.  Pt will be able to correctly perform diaphragmatic breathing and appropriate pressure management in order to prevent worsening vaginal wall laxity and improve pelvic floor A/ROM.   Baseline:  Goal status: INITIAL   LONG TERM GOALS: Target date: 03/18/2022   Pt will be independent with advanced HEP.   Baseline:   Goal status: INITIAL  2.  Pt will demonstrate normal pelvic floor muscle tone and A/ROM, able to achieve 4/5 strength with contractions and 10 sec endurance, in order to provide appropriate lumbopelvic support in functional activities.   Baseline:  Goal status: INITIAL  3.  Pt will report no episodes of urinary incontinence in order to improve confidence in community activities and personal hygiene.   Baseline:  Goal status: INITIAL  4.  Pt will decrease frequency of nightly trips to the bathroom to 1 or less in order to get restful sleep.   Baseline:  Goal status: INITIAL  5.  Pt will report 0/10 pain with vaginal penetration and no undesirable leaking in order to improve intimate relationship with partner.    Baseline:  Goal status: INITIAL   PLAN: PT FREQUENCY: 1x/week  PT DURATION: 12 weeks  PLANNED INTERVENTIONS: Therapeutic exercises, Therapeutic activity, Neuromuscular re-education, Balance training, Gait training, Patient/Family education, Joint mobilization, Dry Needling, Biofeedback, and Manual therapy  PLAN FOR NEXT SESSION: Down training activities for pelvic floor; possible internal pelvic floor release to decrease tension; strict bladder retraining.    Julio AlmKristen Teryl Mcconaghy, PT, DPT06/14/235:29 PM

## 2021-12-24 NOTE — Patient Instructions (Signed)
Vulvar/vaginal Massage: This is a technique to help decrease painful sensitivity in the vaginal area. It can also help to restore normal moisture levels in the vaginal tissues. With coconut oil, aloe, jojoba oil, or a specific vaginal moisturizer, gently massage into vaginal tissues. Think of this as part of your post-shower routine and moisturizing just like you would the rest of the body with lotion. This helps to increase good blood flow to the vaginal tissues. In addition, it also teaches the body that touch to the vagina does not have to be painful or threatening, but moisturizing and gentle.   [facility] [address], [address] [place], [place] [address] Phone # [phone] Fax [phone]  

## 2021-12-29 ENCOUNTER — Encounter: Payer: No Typology Code available for payment source | Attending: General Surgery | Admitting: Dietician

## 2021-12-29 ENCOUNTER — Encounter: Payer: Self-pay | Admitting: Dietician

## 2021-12-29 DIAGNOSIS — Z713 Dietary counseling and surveillance: Secondary | ICD-10-CM | POA: Diagnosis not present

## 2021-12-29 DIAGNOSIS — R519 Headache, unspecified: Secondary | ICD-10-CM | POA: Insufficient documentation

## 2021-12-29 DIAGNOSIS — D649 Anemia, unspecified: Secondary | ICD-10-CM | POA: Insufficient documentation

## 2021-12-29 DIAGNOSIS — R7303 Prediabetes: Secondary | ICD-10-CM | POA: Diagnosis not present

## 2021-12-29 DIAGNOSIS — R12 Heartburn: Secondary | ICD-10-CM | POA: Diagnosis not present

## 2021-12-29 DIAGNOSIS — E782 Mixed hyperlipidemia: Secondary | ICD-10-CM | POA: Insufficient documentation

## 2021-12-29 NOTE — Progress Notes (Signed)
Supervised Weight Loss Visit Bariatric Nutrition Education  NUTRITION ASSESSMENT  4 of 12 SWL  Planned surgery: sleeve gastrectomy   Anthropometrics  Start weight at [facility]: 257.3 lbs (date: 11/12/2021)  Height: 59" Weight: 250.3 lbs BMI: 50.55 kg/m2     Clinical  Medical hx: N/A Medications: N/A  Labs: none update in EMR Notable signs/symptoms: migraines: sates she believes them to be work related stating she has them every day Any previous deficiencies? No  Lifestyle & Dietary Hx  Pt states she is still walking after dinner.  Stating she is doing 3 laps around the apartment building.  Pt states she was had a hard time with 2 laps at first, but states she can do 3 now. Pt states she knows she is an emotional eater, but states she has learned to do other things to avoid her cravings. Pt states she tries to decide when she is full, stating that her sister always states out loud, "I'm full".  Discussed with patient getting to satisfaction before fullness. Pt states before this journey she used to have to stuff herself to stop eating, but her therapist told her that there is a voice telling you you're full, you just have to listen for it. Pt state she can now here it, but states she has to listen for it. Pt states she is doing the spinach, baked fish and roasted diced potatoes.  Pt states, it is easier for her to have the same thing every night. Dietitian discussed with the patient the need of getting a variety of foods in order to get more of the the essential vitamins and minerals. Pt states she drinks a lot of water.   Estimated daily fluid intake: 126 oz Supplements:  Current average weekly physical activity: Walking after dinner every night (about 3 miles)  24-Hr Dietary Recall... from previous visit:  wakes around 6am:  First Meal 8: egg, [place] bacon Snack:  Second Meal 12: (made at home) humus and tomato and cucumbers  Snack: fruit Third Meal: spinach, baked fish,  potatoes diced (roasted) Snack:  Beverages: 2 glasses of wine, water  Estimated Energy Needs Calories: 1500   NUTRITION DIAGNOSIS  Overweight/obesity ([place]-3.3) related to past poor dietary habits and physical inactivity as evidenced by patient w/ planned sleeve gastrectomy surgery following dietary guidelines for continued weight loss.   NUTRITION INTERVENTION  Nutrition counseling (C-1) and education (E-2) to facilitate bariatric surgery goals.  Pre-Op Goals Progress & New Goals Continue: Stop at fullness by putting smaller portions on your plate Continue: getting consistent with changes already made Continue: chew each bite well Continue: fill out the mindful meals sheet Continue: keep walking after dinner. New:  Saying "I'm full" out loud (like Sister does) Re-engage/Continue: focus on those 3 meals for dinner on rotation until next visit, to get a variety of foods.  Handouts Provided Include  Mindful Meals Sheet  Learning Style & Readiness for Change Teaching method utilized: Visual & Auditory  Demonstrated degree of understanding via: Teach Back  Readiness Level: preparation Barriers to learning/adherence to lifestyle change: none identified   RD's Notes for next Visit  Assess pts adherence to chosen goals   MONITORING & EVALUATION Dietary intake, weekly physical activity, body weight, and pre-op goals   Next Steps  Patient is to return to [facility] in 1-2 weeks for next visit

## 2021-12-30 ENCOUNTER — Ambulatory Visit: Payer: No Typology Code available for payment source

## 2021-12-30 DIAGNOSIS — R279 Unspecified lack of coordination: Secondary | ICD-10-CM | POA: Diagnosis not present

## 2021-12-30 DIAGNOSIS — M6289 Other specified disorders of muscle: Secondary | ICD-10-CM | POA: Diagnosis not present

## 2021-12-30 DIAGNOSIS — M6281 Muscle weakness (generalized): Secondary | ICD-10-CM | POA: Diagnosis not present

## 2021-12-30 DIAGNOSIS — R293 Abnormal posture: Secondary | ICD-10-CM

## 2021-12-30 DIAGNOSIS — M62838 Other muscle spasm: Secondary | ICD-10-CM | POA: Diagnosis not present

## 2021-12-30 NOTE — Patient Instructions (Addendum)
Relieving pelvic pressure: Lying in an inverted position can help relieve the sensation of pelvic pressure Your legs can be up the wall, on a chair, or over the couch; the important thing is to make sure you have hips are elevated  ? Perform this position: When you feel pressure throughout the day At the end of the day While in this position, perform deep, diaphragmatic breathing to help all of your muscles relax    [facility] [address], [address] [place], [place] [address] Phone # [phone] Fax [phone]

## 2021-12-30 NOTE — Therapy (Signed)
OUTPATIENT PHYSICAL THERAPY TREATMENT NOTE   Patient Name: Colleen Fields MRN: [id number] DOB:[birth year], 31 y.o., female Today's Date: 12/30/2021  PCP: None REFERRING PROVIDER: Carlynn Herald, CNM  END OF SESSION:   PT End of Session - 12/30/21 1447     Visit Number 2    Date for PT Re-Evaluation 03/18/22    Authorization Type [employer]    Authorization Time Period 12/24/21-02/21/22    Authorization - Visit Number 1    Authorization - Number of Visits 8    PT Start Time 1445    PT Stop Time 1525    PT Time Calculation (min) 40 min             Past Medical History:  Diagnosis Date   Chest pain    Depression    Gestational diabetes    insulin   History of gestational diabetes mellitus 09/05/2017   [ ]  qyear a1c  09/2020: passed 2h post partum GTT   History of severe pre-eclampsia 09/02/2020   Postpartum readmission for MGSO4   Migraine    Obesity 07/22/2017   Palpitations    Tachycardia    Past Surgical History:  Procedure Laterality Date   CESAREAN SECTION     WISDOM TOOTH EXTRACTION     Patient Active Problem List   Diagnosis Date Noted   History of migraine 09/12/2020   Blood transfusion declined because patient is Jehovah's Witness 08/24/2020   BMI 45.0-49.9, adult (HCC) 08/21/2020   Obstructive sleep apnea on CPAP 02/28/2020   Anxiety and depression 12/09/2017    REFERRING DIAG: M62.89 (ICD-10-CM) - Pelvic floor dysfunction in female  THERAPY DIAG:  Muscle weakness (generalized)  Unspecified lack of coordination  Other muscle spasm  Abnormal posture  Rationale for Evaluation and Treatment Rehabilitation  PERTINENT HISTORY: C-section and 2 vaginal deliveries  PRECAUTIONS: NA  SUBJECTIVE: Patient states that she feels like she is emptying her bladder/urinating longer at once. She reports feeling overwhelmed with breast feeding with this child.   PAIN:  Are you having pain? No   SUBJECTIVE 12/24/21:                                                                                                                                                                                         SUBJECTIVE STATEMENT: After delivery of first child in 2019 she started having urinary incontinence. After second vaginal delivery in 2021, she feels like she has to urinate all the time and when she sits on the toilet she feels significant pressure and has to readjust when on toilet. Patient delivered third child on 08/24/20. 2 boys, one girl  Fluid intake: Yes: Almost a gallon of water a day       PAIN:  Are you having pain? Yes NPRS scale: 7/10 Pain location:  low back   Pain type: aching Pain description: constant    Aggravating factors: standing longer than 5 minutes - needs to lean on cart Relieving factors: leaning forward with support   PRECAUTIONS: None   WEIGHT BEARING RESTRICTIONS No   FALLS:  Has patient fallen in last 6 months? No   LIVING ENVIRONMENT: Lives with: lives with their family Lives in: House/apartment     OCCUPATION: works from home, Patent examiner   PLOF: Independent   PATIENT GOALS get rid of pressure, help low back pain, leaking   PERTINENT HISTORY:  C-section and 2 vaginal deliveries Sexual abuse: No   BOWEL MOVEMENT Pain with bowel movement: No Type of bowel movement:Frequency 1-2x/day and Strain No Constipation started after cleanse at the beginning of the year Fully empty rectum: Yes: - Leakage: No Pads: No Fiber supplement: No   URINATION Pain with urination: No Fully empty bladder: Yes: unsure, has to urinate 30 minutes after she goes Stream:  WNL Urgency: Yes: - Frequency: 10x/day - in 2 hours, she will go 2-3 times Leakage: Urge to void, Walking to the bathroom, Coughing, Sneezing, Laughing, Exercise, Lifting, and Bending forward Pads: Yes: panty liners   does not always wear underwear to make it easier to get to the bathroom without leaking    INTERCOURSE Pain with intercourse: Initial Penetration - she feels like she has to brace at first Ability to have vaginal penetration:  Yes: - Climax: yes - unsure, feels like she may be leaking     PREGNANCY Vaginal deliveries 2 Tearing Yes: both- 1st/2nd degree C-section deliveries 1 Currently pregnant No   PROLAPSE Sensation of vaginal bulging/pressure, bowling ball sitting in pelvis     OBJECTIVE 12/24/21:    PATIENT SURVEYS:    PFIQ-7: 75 (just urine/bladder)   COGNITION:            Overall cognitive status: Within functional limits for tasks assessed                          SENSATION:            Light touch: Appears intact            Proprioception: Appears intact       GAIT:   Comments: Mild trendelenburg bil                POSTURE: anterior pelvic tilt                   PALPATION:               External Perineal Exam WNL                             Internal Pelvic Floor significant tenderness in Bil deep pelvic floor layers; perineal scar tissue restriction; mild Lt superficial pelvic floor tenderness   Patient confirms identification and approves PT to assess internal pelvic floor and treatment Yes   PELVIC MMT:   Strength 3/5 Endurance 5 seconds Repeat contractions 10 - decreasing ability to fully relax in between       TONE: High   PROLAPSE: Grade 2 anterior vaginal wall laxity in supine   TODAY'S TREATMENT 12/30/21 Manual: Internal pelvic floor techniques:  No emotional/communication barriers or cognitive limitation. Patient is motivated to learn. Patient understands and agrees with treatment goals and plan. PT explains patient will be examined in standing, sitting, and lying down to see how their muscles and joints work. When they are ready, they will be asked to remove their underwear so PT can examine their perineum. The patient is also given the option of providing their own chaperone as one is not provided in our facility. The patient also  has the right and is explained the right to defer or refuse any part of the evaluation or treatment including the internal exam. With the patient's consent, PT will use one gloved finger to gently assess the muscles of the pelvic floor, seeing how well it contracts and relaxes and if there is muscle symmetry. After, the patient will get dressed and PT and patient will discuss exam findings and plan of care. PT and patient discuss plan of care, schedule, attendance policy and HEP activities. Superficial/deep pelvic floor release Urethral mobilization bil Bladder mobilization with external release at some time Neuromuscular re-education: Down training: Cat/cow 2 x 10 Child's pose 10 breaths Self-care: Benefit of lactation services - referral provided  TREATMENT 12/24/21  EVAL  Neuromuscular re-education: Pelvic floor contraction training: For A/ROM, increased circulation, improved proprioception Down training: Diaphragmatic breathing/pelvic floor bulge Self-care: Vulvovaginal massage Intra-abdominal pressure management       PATIENT EDUCATION:  Education details: See above self-care/HEP Person educated: Patient Education method: Explanation, Demonstration, Tactile cues, Verbal cues, and Handouts Education comprehension: verbalized understanding     HOME EXERCISE PROGRAM: [id number]   ASSESSMENT:   CLINICAL IMPRESSION: Pt is already seeing good progress with emptying bladder more completely, decreasing frequent trips to the bathroom. Due to notable tension in pelvic floor, we performed internal vaginal pelvic floor release to superficial scar tissue.muscles and bil deeper pelvic floor layers today in addition to urethral.bladder mobilization. She tolerated all manual techniques well with mild residual soreness that radiated into Lt lower quadrant. She did well with initial down training with improved awareness of diaphragmatic breathing and pelvic floor relaxation. She will benefit  from skilled PT intervention in order to improve urinary symptoms, decrease low back pain, decrease pelvic/vaginal pressure, and improve QOL.      OBJECTIVE IMPAIRMENTS decreased activity tolerance, decreased coordination, decreased endurance, decreased mobility, decreased ROM, decreased strength, hypomobility, increased fascial restrictions, increased muscle spasms, impaired tone, postural dysfunction, and pain.    ACTIVITY LIMITATIONS lifting, bending, sleeping, and continence   PARTICIPATION LIMITATIONS: interpersonal relationship, community activity, and occupation   PERSONAL FACTORS 1 comorbidity: G3P3 (1 c-section, 2 vaginal deliveries with tearing)  are also affecting patient's functional outcome.    REHAB POTENTIAL: Good   CLINICAL DECISION MAKING: Stable/uncomplicated   EVALUATION COMPLEXITY: Low     GOALS: Goals reviewed with patient? Yes   SHORT TERM GOALS: Target date: 01/21/2022   Pt will be independent with HEP.    Baseline: Goal status: INITIAL   2.  Pt will be independent with the knack, urge suppression technique, and double voiding in order to improve bladder habits and decrease urinary incontinence.    Baseline:  Goal status: INITIAL   3.  Pt will be able to correctly perform diaphragmatic breathing and appropriate pressure management in order to prevent worsening vaginal wall laxity and improve pelvic floor A/ROM.    Baseline:  Goal status: INITIAL     LONG TERM GOALS: Target date: 03/18/2022    Pt will be independent with advanced HEP.  Baseline:  Goal status: INITIAL   2.  Pt will demonstrate normal pelvic floor muscle tone and A/ROM, able to achieve 4/5 strength with contractions and 10 sec endurance, in order to provide appropriate lumbopelvic support in functional activities.    Baseline:  Goal status: INITIAL   3.  Pt will report no episodes of urinary incontinence in order to improve confidence in community activities and personal hygiene.     Baseline:  Goal status: INITIAL   4.  Pt will decrease frequency of nightly trips to the bathroom to 1 or less in order to get restful sleep.    Baseline:  Goal status: INITIAL   5.  Pt will report 0/10 pain with vaginal penetration and no undesirable leaking in order to improve intimate relationship with partner.     Baseline:  Goal status: INITIAL     PLAN: PT FREQUENCY: 1x/week   PT DURATION: 12 weeks   PLANNED INTERVENTIONS: Therapeutic exercises, Therapeutic activity, Neuromuscular re-education, Balance training, Gait training, Patient/Family education, Joint mobilization, Dry Needling, Biofeedback, and Manual therapy   PLAN FOR NEXT SESSION: Progress down training. Discuss bladder retraining. Consider starting core training.    Julio Alm, PT, DPT06/20/233:54 PM

## 2022-01-02 ENCOUNTER — Ambulatory Visit: Payer: No Typology Code available for payment source | Admitting: Certified Nurse Midwife

## 2022-01-06 ENCOUNTER — Ambulatory Visit: Payer: No Typology Code available for payment source | Admitting: Neurology

## 2022-01-06 DIAGNOSIS — R351 Nocturia: Secondary | ICD-10-CM

## 2022-01-06 DIAGNOSIS — Z820 Family history of epilepsy and other diseases of the nervous system: Secondary | ICD-10-CM

## 2022-01-06 DIAGNOSIS — G4733 Obstructive sleep apnea (adult) (pediatric): Secondary | ICD-10-CM

## 2022-01-06 DIAGNOSIS — Z9189 Other specified personal risk factors, not elsewhere classified: Secondary | ICD-10-CM

## 2022-01-06 DIAGNOSIS — R519 Headache, unspecified: Secondary | ICD-10-CM

## 2022-01-06 DIAGNOSIS — G4719 Other hypersomnia: Secondary | ICD-10-CM

## 2022-01-06 DIAGNOSIS — R0683 Snoring: Secondary | ICD-10-CM

## 2022-01-08 ENCOUNTER — Ambulatory Visit: Payer: No Typology Code available for payment source

## 2022-01-08 DIAGNOSIS — M6281 Muscle weakness (generalized): Secondary | ICD-10-CM

## 2022-01-08 DIAGNOSIS — R293 Abnormal posture: Secondary | ICD-10-CM | POA: Diagnosis not present

## 2022-01-08 DIAGNOSIS — R279 Unspecified lack of coordination: Secondary | ICD-10-CM

## 2022-01-08 DIAGNOSIS — M6289 Other specified disorders of muscle: Secondary | ICD-10-CM | POA: Diagnosis not present

## 2022-01-08 DIAGNOSIS — M62838 Other muscle spasm: Secondary | ICD-10-CM

## 2022-01-08 NOTE — Progress Notes (Signed)
See procedure note.

## 2022-01-08 NOTE — Patient Instructions (Signed)
Urge suppression technique: ?A technique to help you hold urine until it?s an appropriate time to go, ?whether this is making it home or trying to reach a specific voiding time ?frame according to your schedule. ?It helps to send signals from the bladder to the brain that say you don?t ?actually have to void urine right now. ?This most likely only give you several minutes of relief at first, but repeat as ?needed; the benefit will last longer as you use this technique more and get ?into better bladder habits. ??  ?The technique: ?o Perform 5 quick flicks (Kegels) rapidly, not worrying about fully ?relaxing in between each (only in this technique). ?o Then perform several deep belly breaths while focusing on relaxing ?the pelvic floor. ?o Go do something else to help distract yourself from the urge to ?urinate. ?o Repeat as needed. ? ? ?Bladder retraining: ?Drink 4-8oz an hour ?ONLY WATER ?Stop water intake 3 hours before bed ?Try to go at least 2-3 hours between trips to the bathroom - go whether you need to or not. ? ?For two weeks. ? ? ?

## 2022-01-08 NOTE — Therapy (Signed)
OUTPATIENT PHYSICAL THERAPY TREATMENT NOTE   Patient Name: Colleen Fields MRN: [id number] DOB:[birth year], 31 y.o., female Today's Date: 01/08/2022  PCP: None REFERRING PROVIDER: Carlynn Herald, CNM  END OF SESSION:   PT End of Session - 01/08/22 1103     Visit Number 3    Date for PT Re-Evaluation 03/18/22    Authorization Type [employer]    Authorization Time Period 12/24/21-02/21/22    Authorization - Visit Number 2    Authorization - Number of Visits 8    PT Start Time 1100    PT Stop Time 1140    PT Time Calculation (min) 40 min    Activity Tolerance Patient tolerated treatment well    Behavior During Therapy [facility] for tasks assessed/performed              Past Medical History:  Diagnosis Date   Chest pain    Depression    Gestational diabetes    insulin   History of gestational diabetes mellitus 09/05/2017   [ ]  qyear a1c  09/2020: passed 2h post partum GTT   History of severe pre-eclampsia 09/02/2020   Postpartum readmission for MGSO4   Migraine    Obesity 07/22/2017   Palpitations    Tachycardia    Past Surgical History:  Procedure Laterality Date   CESAREAN SECTION     WISDOM TOOTH EXTRACTION     Patient Active Problem List   Diagnosis Date Noted   History of migraine 09/12/2020   Blood transfusion declined because patient is Jehovah's Witness 08/24/2020   BMI 45.0-49.9, adult (HCC) 08/21/2020   Obstructive sleep apnea on CPAP 02/28/2020   Anxiety and depression 12/09/2017    REFERRING DIAG: M62.89 (ICD-10-CM) - Pelvic floor dysfunction in female  THERAPY DIAG:  Muscle weakness (generalized)  Unspecified lack of coordination  Other muscle spasm  Abnormal posture  Rationale for Evaluation and Treatment Rehabilitation  PERTINENT HISTORY: C-section and 2 vaginal deliveries  PRECAUTIONS: NA  SUBJECTIVE: Patient still feels like she is urinating for longer periods of time at once. She feels like urgency is better as well. She  still does have intense urge. She states that she was sore after last treatment session, but she felt a sense of lightness afterwards. She is noticing some improvement in low back pain. She has become more aware of holding tension in abdominals.   PAIN:  Are you having pain? No   SUBJECTIVE 12/24/21:  SUBJECTIVE STATEMENT: After delivery of first child in 2019 she started having urinary incontinence. After second vaginal delivery in 2021, she feels like she has to urinate all the time and when she sits on the toilet she feels significant pressure and has to readjust when on toilet. Patient delivered third child on 08/24/20. 2 boys, one girl     Fluid intake: Yes: Almost a gallon of water a day       PAIN:  Are you having pain? Yes NPRS scale: 7/10 Pain location:  low back   Pain type: aching Pain description: constant    Aggravating factors: standing longer than 5 minutes - needs to lean on cart Relieving factors: leaning forward with support   PRECAUTIONS: None   WEIGHT BEARING RESTRICTIONS No   FALLS:  Has patient fallen in last 6 months? No   LIVING ENVIRONMENT: Lives with: lives with their family Lives in: House/apartment     OCCUPATION: works from home, Patent examiner   PLOF: Independent   PATIENT GOALS get rid of pressure, help low back pain, leaking   PERTINENT HISTORY:  C-section and 2 vaginal deliveries Sexual abuse: No   BOWEL MOVEMENT Pain with bowel movement: No Type of bowel movement:Frequency 1-2x/day and Strain No Constipation started after cleanse at the beginning of the year Fully empty rectum: Yes: - Leakage: No Pads: No Fiber supplement: No   URINATION Pain with urination: No Fully empty bladder: Yes: unsure, has to urinate 30 minutes after she  goes Stream:  WNL Urgency: Yes: - Frequency: 10x/day - in 2 hours, she will go 2-3 times Leakage: Urge to void, Walking to the bathroom, Coughing, Sneezing, Laughing, Exercise, Lifting, and Bending forward Pads: Yes: panty liners   does not always wear underwear to make it easier to get to the bathroom without leaking   INTERCOURSE Pain with intercourse: Initial Penetration - she feels like she has to brace at first Ability to have vaginal penetration:  Yes: - Climax: yes - unsure, feels like she may be leaking     PREGNANCY Vaginal deliveries 2 Tearing Yes: both- 1st/2nd degree C-section deliveries 1 Currently pregnant No   PROLAPSE Sensation of vaginal bulging/pressure, bowling ball sitting in pelvis     OBJECTIVE 12/24/21:    PATIENT SURVEYS:    PFIQ-7: 75 (just urine/bladder)   COGNITION:            Overall cognitive status: Within functional limits for tasks assessed                          SENSATION:            Light touch: Appears intact            Proprioception: Appears intact       GAIT:   Comments: Mild trendelenburg bil                POSTURE: anterior pelvic tilt                   PALPATION:               External Perineal Exam WNL                             Internal Pelvic Floor significant tenderness in Bil deep pelvic floor layers; perineal scar tissue restriction; mild Lt superficial pelvic floor tenderness   Patient confirms identification  and approves PT to assess internal pelvic floor and treatment Yes   PELVIC MMT:   Strength 3/5 Endurance 5 seconds Repeat contractions 10 - decreasing ability to fully relax in between       TONE: High   PROLAPSE: Grade 2 anterior vaginal wall laxity in supine   TODAY'S TREATMENT 01/08/22 Neuromuscular re-education: Core retraining:  Transversus abdominus training with multimodal cues for improved motor control and breath coordination Core facilitation: Resisted march, no diagonal 5 x 5 sec  bil Supine march 2 x 10 Supine ball squeeze 10 x 5 sec Self-care: Urge suppression technique Strict bladder retraining   TREATMENT 12/30/21 Manual: Internal pelvic floor techniques: No emotional/communication barriers or cognitive limitation. Patient is motivated to learn. Patient understands and agrees with treatment goals and plan. PT explains patient will be examined in standing, sitting, and lying down to see how their muscles and joints work. When they are ready, they will be asked to remove their underwear so PT can examine their perineum. The patient is also given the option of providing their own chaperone as one is not provided in our facility. The patient also has the right and is explained the right to defer or refuse any part of the evaluation or treatment including the internal exam. With the patient's consent, PT will use one gloved finger to gently assess the muscles of the pelvic floor, seeing how well it contracts and relaxes and if there is muscle symmetry. After, the patient will get dressed and PT and patient will discuss exam findings and plan of care. PT and patient discuss plan of care, schedule, attendance policy and HEP activities. Superficial/deep pelvic floor release Urethral mobilization bil Bladder mobilization with external release at some time Neuromuscular re-education: Down training: Cat/cow 2 x 10 Child's pose 10 breaths Self-care: Benefit of lactation services - referral provided  TREATMENT 12/24/21  EVAL  Neuromuscular re-education: Pelvic floor contraction training: For A/ROM, increased circulation, improved proprioception Down training: Diaphragmatic breathing/pelvic floor bulge Self-care: Vulvovaginal massage Intra-abdominal pressure management       PATIENT EDUCATION:  Education details: See above self-care/HEP progressions Person educated: Patient Education method: Explanation, Demonstration, Tactile cues, Verbal cues, and  Handouts Education comprehension: verbalized understanding     HOME EXERCISE PROGRAM: [id number]   ASSESSMENT:   CLINICAL IMPRESSION: Patient still seeing some improvements, but continues to have difficulty with urgency. We discussed avoiding abdominal bracing and strict bladder retraining with the urge suppression technique; pt agreeable to try. She did well with core training and incorporation into progressions for core facilitation. Believe she is making good progress so far and will continue to progress well with bladder retraining this week. She will benefit from skilled PT intervention in order to improve urinary symptoms, decrease low back pain, decrease pelvic/vaginal pressure, and improve QOL.      OBJECTIVE IMPAIRMENTS decreased activity tolerance, decreased coordination, decreased endurance, decreased mobility, decreased ROM, decreased strength, hypomobility, increased fascial restrictions, increased muscle spasms, impaired tone, postural dysfunction, and pain.    ACTIVITY LIMITATIONS lifting, bending, sleeping, and continence   PARTICIPATION LIMITATIONS: interpersonal relationship, community activity, and occupation   PERSONAL FACTORS 1 comorbidity: G3P3 (1 c-section, 2 vaginal deliveries with tearing)  are also affecting patient's functional outcome.    REHAB POTENTIAL: Good   CLINICAL DECISION MAKING: Stable/uncomplicated   EVALUATION COMPLEXITY: Low     GOALS: Goals reviewed with patient? Yes   SHORT TERM GOALS: Target date: 01/21/2022   Pt will be independent with HEP.  Baseline: Goal status: INITIAL   2.  Pt will be independent with the knack, urge suppression technique, and double voiding in order to improve bladder habits and decrease urinary incontinence.    Baseline:  Goal status: INITIAL   3.  Pt will be able to correctly perform diaphragmatic breathing and appropriate pressure management in order to prevent worsening vaginal wall laxity and improve  pelvic floor A/ROM.    Baseline:  Goal status: INITIAL     LONG TERM GOALS: Target date: 03/18/2022    Pt will be independent with advanced HEP.    Baseline:  Goal status: INITIAL   2.  Pt will demonstrate normal pelvic floor muscle tone and A/ROM, able to achieve 4/5 strength with contractions and 10 sec endurance, in order to provide appropriate lumbopelvic support in functional activities.    Baseline:  Goal status: INITIAL   3.  Pt will report no episodes of urinary incontinence in order to improve confidence in community activities and personal hygiene.    Baseline:  Goal status: INITIAL   4.  Pt will decrease frequency of nightly trips to the bathroom to 1 or less in order to get restful sleep.    Baseline:  Goal status: INITIAL   5.  Pt will report 0/10 pain with vaginal penetration and no undesirable leaking in order to improve intimate relationship with partner.     Baseline:  Goal status: INITIAL     PLAN: PT FREQUENCY: 1x/week   PT DURATION: 12 weeks   PLANNED INTERVENTIONS: Therapeutic exercises, Therapeutic activity, Neuromuscular re-education, Balance training, Gait training, Patient/Family education, Joint mobilization, Dry Needling, Biofeedback, and Manual therapy   PLAN FOR NEXT SESSION: Progress down training. Discuss bladder retraining. Consider starting core training.    Julio Alm, PT, DPT06/29/2311:43 AM

## 2022-01-09 ENCOUNTER — Ambulatory Visit (INDEPENDENT_AMBULATORY_CARE_PROVIDER_SITE_OTHER): Payer: No Typology Code available for payment source | Admitting: Obstetrics & Gynecology

## 2022-01-09 ENCOUNTER — Encounter: Payer: Self-pay | Admitting: Obstetrics & Gynecology

## 2022-01-09 ENCOUNTER — Other Ambulatory Visit: Payer: Self-pay

## 2022-01-09 VITALS — BP 130/90 | HR 96 | Wt 246.0 lb

## 2022-01-09 DIAGNOSIS — Z30431 Encounter for routine checking of intrauterine contraceptive device: Secondary | ICD-10-CM | POA: Diagnosis not present

## 2022-01-09 DIAGNOSIS — Z975 Presence of (intrauterine) contraceptive device: Secondary | ICD-10-CM | POA: Diagnosis not present

## 2022-01-09 DIAGNOSIS — N921 Excessive and frequent menstruation with irregular cycle: Secondary | ICD-10-CM | POA: Diagnosis not present

## 2022-01-09 LAB — CBC
Hematocrit: 37.3 % (ref 34.0–46.6)
Hemoglobin: 12.8 g/dL (ref 11.1–15.9)
MCH: 30.3 pg (ref 26.6–33.0)
MCHC: 34.3 g/dL (ref 31.5–35.7)
MCV: 88 fL (ref 79–97)
Platelets: 250 10*3/uL (ref 150–450)
RBC: 4.22 x10E6/uL (ref 3.77–5.28)
RDW: 13.2 % (ref 11.7–15.4)
WBC: 4.2 10*3/uL (ref 3.4–10.8)

## 2022-01-09 MED ORDER — TRANEXAMIC ACID 650 MG PO TABS: 1300.0000 mg | ORAL_TABLET | Freq: Three times a day (TID) | ORAL | 2 refills | Status: DC

## 2022-01-09 MED ORDER — NAPROXEN SODIUM 550 MG PO TABS: 550.0000 mg | ORAL_TABLET | Freq: Two times a day (BID) | ORAL | 2 refills | Status: DC

## 2022-01-09 NOTE — Progress Notes (Signed)
c   GYNECOLOGY OFFICE ENCOUNTER NOTE  History:  31 y.o. [id number] here today for today for IUD string check; Paragard  IUD was placed  11/26/21. Had a very heavy menstrual period last month and felt very dizzy and lightheaded, worried about possible anemia and wants her blood level checked.  Also feels IUD strings are long and wants this cut. No other complaints about the IUD, no concerning side effects.  The following portions of the patient's history were reviewed and updated as appropriate: allergies, current medications, past family history, past medical history, past social history, past surgical history and problem list. Last pap smear on 03/21/2020 was normal, negative HRHPV.  Review of Systems:  Pertinent items are noted in HPI.   Objective:  Physical Exam Blood pressure 130/90, pulse 96, weight 246 lb (111.6 kg), last menstrual period 12/22/2021, not currently breastfeeding. CONSTITUTIONAL: Well-developed, well-nourished female in no acute distress.  NEUROLOGIC: Alert and oriented to person, place, and time. Normal reflexes, muscle tone coordination.  PSYCHIATRIC: Normal mood and affect. Normal behavior. Normal judgment and thought content. CARDIOVASCULAR: Normal heart rate noted RESPIRATORY: Effort and breath sounds normal, no problems with respiration noted ABDOMEN: Soft, no distention noted.   PELVIC: Normal appearing external genitalia; normal appearing vaginal mucosa and cervix.  IUD strings visualized, about 5 cm in length outside cervix.Strings cut to 2 cm in length. Done in the presence of a chaperone.   Assessment & Plan:  Will check CBC, discussed use of Lysteda and/or Naproxen for heavy menstrual bleeding with Paragard in place.  Reassured that this can happen for first few months after IUD, but then settles down to normal periods.  However, if is continues past a few months, may consider progestin IUD as alternative. Patient to keep IUD in place for up to ten years; can come  in for removal earlier if she desires or for any concerning side effects. Recommended condoms for STI prevention. Routine preventative health maintenance measures emphasized.   Jaynie Collins, MD, FACOG Obstetrician & Gynecologist, [facility] for [employer], [facility]

## 2022-01-09 NOTE — Procedures (Signed)
[facility]  HOME SLEEP TEST (Watch PAT) REPORT  STUDY DATE: 01/06/2022  DOB: [birth year]  MRN: [id number]  ORDERING CLINICIAN: Huston Foley, MD, PhD   REFERRING CLINICIAN: Dr. Gaynelle Adu  CLINICAL INFORMATION/HISTORY: 31 year old right-handed woman with an underlying medical history of anemia, prediabetes, hyperlipidemia, enlarged pituitary gland, migraines, palpitations, history of preeclampsia, history of chest pain, depression, and morbid obesity with a BMI of 50, who reports snoring and excessive daytime somnolence, as well as recurrent headaches.   Epworth sleepiness score: 18/24.  BMI: 51.6 kg/m  FINDINGS:   Sleep Summary:   Total Recording Time (hours, min): 9 hours, 36 min  Total Sleep Time (hours, min):  8 hours, 16 min  Percent REM (%):    14.7%   Respiratory Indices:   Calculated pAHI (per hour):  9.4/hour         REM pAHI:    13.7/hour       NREM pAHI: 8.7/hour  Central pAHI: 0.6/hour  Oxygen Saturation Statistics:    Oxygen Saturation (%) Mean: 95%   Minimum oxygen saturation (%):                 86%   O2 Saturation Range (%): 86-99%    O2 Saturation (minutes) <=88%: 0 min  Pulse Rate Statistics:   Pulse Mean (bpm):    73/min    Pulse Range (51-103/min)   IMPRESSION: OSA (obstructive sleep apnea), mild  RECOMMENDATION:  This home sleep test demonstrates overall mild obstructive sleep apnea with a total AHI of 9.4/hour and O2 nadir of 86%. Snoring was detected, mostly in the mild to moderate range, at times intermittent and in the louder range. Given the patient's medical history and sleep related complaints, therapy with a positive airway pressure device is a reasonable first-line choice and clinically recommended. Treatment can be achieved in the form of autoPAP trial/titration at home for now. A full night, in-lab PAP titration study may aid in improving proper treatment settings and with mask fit, if needed, down the  road. Alternative treatments may include weight loss (where appropriate; this patient is being evaluated for bariatric surgery) along with avoidance of the supine sleep position (if possible), or an oral appliance in appropriate candidates.   Please note that untreated obstructive sleep apnea may carry additional perioperative morbidity. Patients with significant obstructive sleep apnea should receive perioperative PAP therapy and the surgeons and particularly the anesthesiologist should be informed of the diagnosis and the severity of the sleep disordered breathing. The patient should be cautioned not to drive, work at heights, or operate dangerous or heavy equipment when tired or sleepy. Review and reiteration of good sleep hygiene measures should be pursued with any patient. Other causes of the patient's symptoms, including circadian rhythm disturbances, an underlying mood disorder, medication effect and/or an underlying medical problem cannot be ruled out based on this test. Clinical correlation is recommended.  The patient and her referring provider will be notified of the test results. The patient will be seen in follow up in sleep clinic at [facility], as necessary.  I certify that I have reviewed the raw data recording prior to the issuance of this report in accordance with the standards of the American Academy of Sleep Medicine (AASM).  INTERPRETING PHYSICIAN:   Huston Foley, MD, PhD  Board Certified in Neurology and Sleep Medicine  [facility] [address] [place], [place] [address] [phone]

## 2022-01-09 NOTE — Addendum Note (Signed)
Addended by: Huston Foley on: 01/09/2022 12:23 PM   Modules accepted: Orders

## 2022-01-12 ENCOUNTER — Telehealth: Payer: Self-pay

## 2022-01-12 ENCOUNTER — Encounter: Payer: Self-pay | Admitting: Skilled Nursing Facility1

## 2022-01-12 ENCOUNTER — Encounter: Payer: No Typology Code available for payment source | Attending: General Surgery | Admitting: Skilled Nursing Facility1

## 2022-01-12 DIAGNOSIS — D649 Anemia, unspecified: Secondary | ICD-10-CM | POA: Insufficient documentation

## 2022-01-12 DIAGNOSIS — Z713 Dietary counseling and surveillance: Secondary | ICD-10-CM | POA: Insufficient documentation

## 2022-01-12 DIAGNOSIS — E669 Obesity, unspecified: Secondary | ICD-10-CM

## 2022-01-12 DIAGNOSIS — R21 Rash and other nonspecific skin eruption: Secondary | ICD-10-CM | POA: Insufficient documentation

## 2022-01-12 DIAGNOSIS — E782 Mixed hyperlipidemia: Secondary | ICD-10-CM | POA: Diagnosis not present

## 2022-01-12 DIAGNOSIS — R519 Headache, unspecified: Secondary | ICD-10-CM | POA: Diagnosis not present

## 2022-01-12 DIAGNOSIS — Z6841 Body Mass Index (BMI) 40.0 and over, adult: Secondary | ICD-10-CM | POA: Diagnosis not present

## 2022-01-12 DIAGNOSIS — R7303 Prediabetes: Secondary | ICD-10-CM | POA: Diagnosis not present

## 2022-01-12 NOTE — Telephone Encounter (Signed)
-----   Message from Huston Foley, MD sent at 01/09/2022 12:23 PM EDT ----- Patient referred by Dr. Gaynelle Adu, seen by me on 12/10/2021 as she is being evaluated for bariatric surgery.  She had a home sleep test on 01/06/2022.    Please call and notify the patient that the recent home sleep test showed obstructive sleep apnea. OSA is overall mild, but worth treating to see if she feels better after treatment. To that end I recommend treatment for this in the form of autoPAP, which means, that we don't have to bring her in for a sleep study with CPAP, but will let her try an autoPAP machine at home, through a DME company (of her choice, or as per insurance requirement). The DME representative will educate her on how to use the machine, how to put the mask on, etc. I have placed an order in the chart. Please send referral, talk to patient, send report to referring MD. We will need a FU in sleep clinic for 10 weeks post-PAP set up, please arrange that with me or one of our NPs. Thanks,   Huston Foley, MD, PhD [facility])

## 2022-01-12 NOTE — Telephone Encounter (Signed)
I called pt. No answer, left a message asking pt to call me back.   

## 2022-01-12 NOTE — Progress Notes (Signed)
Supervised Weight Loss Visit Bariatric Nutrition Education  NUTRITION ASSESSMENT  5 of 12 SWL  Planned surgery: sleeve gastrectomy   Anthropometrics  Start weight at [facility]: 257.3 lbs (date: 11/12/2021)  Height: 59" Weight: 252 lbs BMI: 50.90 kg/m2     Clinical  Medical hx: N/A Medications: N/A  Labs: none update in EMR Notable signs/symptoms: migraines: sates she believes them to be work related stating she has them every day Any previous deficiencies? No  Lifestyle & Dietary Hx  Pt arrives feeling stress over making lifestyle changes,   Pt states she is currently breast feeding and has been since February 2022. Pt states she skips about half of her meals. Pt states she will register fullness but does not wish to listen to it.   Pt states eating right and making the right choices is a lot of work. Pt states she has been juicing (after further discussion using a blender and then putting it through a strainer).  Pt states she is Feeling frustrated with brain fog and making food choices daily.  Pt states when she walks with the kids it is more stressful so tries to not have them there but then that adds more stress because she feels she should be with them.  Pt states she works with a  pelvic floor therapist.  Pt state she has been watching out for if wine has become a stress crutch but states she can go without Wine but cant go without the chocolate so she is going to cut out all wine.  Pt states she eats late but feels she has no control over how late it is and was worried she is hungry in the morning when previous she could go hours without eating: Dietitian advised this hunger in the morning is good though because it was not appropriate for her long term health goals to wait many hours before eating in the morning.   Estimated daily fluid intake: 126 oz Supplements:  Current average weekly physical activity: Walking after dinner every night (about 3 miles)  24-Hr Dietary  Recal wakes around 6am:  First Meal 8: egg, [place] bacon Snack: blended juice thing until 12 Second Meal 12: 2 eggs cheese spinach bell peppers tomatoes and onions and [place] bacon Snack: bledned juice and tomato and hummus and cucumber Third Meal: spinach, baked fish, sometimes with potatoes diced (roasted) or steak and potatoes or chinese food Snack: chocoalte  Beverages: bottle of wine over 2 days, water  Estimated Energy Needs Calories: 1500   NUTRITION DIAGNOSIS  Overweight/obesity ([place]-3.3) related to past poor dietary habits and physical inactivity as evidenced by patient w/ planned sleeve gastrectomy surgery following dietary guidelines for continued weight loss.   NUTRITION INTERVENTION  Nutrition counseling (C-1) and education (E-2) to facilitate bariatric surgery goals.  Pre-Op Goals Progress & New Goals Continue: Stop at fullness by putting smaller portions on your plate Continue: getting consistent with changes already made Continue: chew each bite well Continue: fill out the mindful meals sheet Continue: keep walking after dinner. continue:  Saying "I'm full" out loud (like Sister does) Continue: focus on those 3 meals for dinner on rotation until next visit, to get a variety of foods.  Handouts Provided Include  Mindful Meals Sheet  Learning Style & Readiness for Change Teaching method utilized: Visual & Auditory  Demonstrated degree of understanding via: Teach Back  Readiness Level: preparation Barriers to learning/adherence to lifestyle change: none identified   RD's Notes for next Visit  Assess pts  adherence to chosen goals   MONITORING & EVALUATION Dietary intake, weekly physical activity, body weight, and pre-op goals   Next Steps  Patient is to return to [facility] in 1-2 weeks for next visit

## 2022-01-14 ENCOUNTER — Ambulatory Visit: Payer: No Typology Code available for payment source | Attending: Certified Nurse Midwife

## 2022-01-14 DIAGNOSIS — R279 Unspecified lack of coordination: Secondary | ICD-10-CM | POA: Diagnosis not present

## 2022-01-14 DIAGNOSIS — R293 Abnormal posture: Secondary | ICD-10-CM | POA: Diagnosis not present

## 2022-01-14 DIAGNOSIS — M62838 Other muscle spasm: Secondary | ICD-10-CM | POA: Diagnosis not present

## 2022-01-14 DIAGNOSIS — M6281 Muscle weakness (generalized): Secondary | ICD-10-CM | POA: Diagnosis not present

## 2022-01-14 NOTE — Therapy (Signed)
OUTPATIENT PHYSICAL THERAPY TREATMENT NOTE   Patient Name: Colleen Fields MRN: [id number] DOB:17-Aug-1990, 31 y.o., female Today's Date: 01/14/2022  PCP: None REFERRING PROVIDER: Carlynn Herald, CNM  END OF SESSION:   PT End of Session - 01/14/22 1409     Visit Number 4    Date for PT Re-Evaluation 03/18/22    Authorization Type [employer]    Authorization Time Period 12/24/21-02/21/22    Authorization - Visit Number 3    Authorization - Number of Visits 8    PT Start Time 1407    PT Stop Time 1440    PT Time Calculation (min) 33 min    Activity Tolerance Patient tolerated treatment well    Behavior During Therapy [facility] for tasks assessed/performed               Past Medical History:  Diagnosis Date   Chest pain    Depression    Gestational diabetes    insulin   History of gestational diabetes mellitus 09/05/2017   [ ]  qyear a1c  09/2020: passed 2h post partum GTT   History of severe pre-eclampsia 09/02/2020   Postpartum readmission for MGSO4   Migraine    Obesity 07/22/2017   Palpitations    Tachycardia    Past Surgical History:  Procedure Laterality Date   CESAREAN SECTION     WISDOM TOOTH EXTRACTION     Patient Active Problem List   Diagnosis Date Noted   History of migraine 09/12/2020   Blood transfusion declined because patient is Jehovah's Witness 08/24/2020   BMI 45.0-49.9, adult (HCC) 08/21/2020   Obstructive sleep apnea on CPAP 02/28/2020   Anxiety and depression 12/09/2017    REFERRING DIAG: M62.89 (ICD-10-CM) - Pelvic floor dysfunction in female  THERAPY DIAG:  Muscle weakness (generalized)  Unspecified lack of coordination  Other muscle spasm  Abnormal posture  Rationale for Evaluation and Treatment Rehabilitation  PERTINENT HISTORY: C-section and 2 vaginal deliveries  PRECAUTIONS: NA  SUBJECTIVE: Pt states that bladder retraining is going well and urge suppression technique is helpful at increasing time she can wait  to urinate. Pt has gone through core strengthening exercises and reports no questions.   PAIN:  Are you having pain? No   SUBJECTIVE 12/24/21:                                                                                                                                                                                        SUBJECTIVE STATEMENT: After delivery of first child in 2019 she started having urinary incontinence. After second vaginal delivery in 2021, she feels like she has to urinate all the  time and when she sits on the toilet she feels significant pressure and has to readjust when on toilet. Patient delivered third child on 08/24/20. 2 boys, one girl     Fluid intake: Yes: Almost a gallon of water a day       PAIN:  Are you having pain? Yes NPRS scale: 7/10 Pain location:  low back   Pain type: aching Pain description: constant    Aggravating factors: standing longer than 5 minutes - needs to lean on cart Relieving factors: leaning forward with support   PRECAUTIONS: None   WEIGHT BEARING RESTRICTIONS No   FALLS:  Has patient fallen in last 6 months? No   LIVING ENVIRONMENT: Lives with: lives with their family Lives in: House/apartment     OCCUPATION: works from home, Patent examiner   PLOF: Independent   PATIENT GOALS get rid of pressure, help low back pain, leaking   PERTINENT HISTORY:  C-section and 2 vaginal deliveries Sexual abuse: No   BOWEL MOVEMENT Pain with bowel movement: No Type of bowel movement:Frequency 1-2x/day and Strain No Constipation started after cleanse at the beginning of the year Fully empty rectum: Yes: - Leakage: No Pads: No Fiber supplement: No   URINATION Pain with urination: No Fully empty bladder: Yes: unsure, has to urinate 30 minutes after she goes Stream:  WNL Urgency: Yes: - Frequency: 10x/day - in 2 hours, she will go 2-3 times Leakage: Urge to void, Walking to the bathroom, Coughing, Sneezing,  Laughing, Exercise, Lifting, and Bending forward Pads: Yes: panty liners   does not always wear underwear to make it easier to get to the bathroom without leaking   INTERCOURSE Pain with intercourse: Initial Penetration - she feels like she has to brace at first Ability to have vaginal penetration:  Yes: - Climax: yes - unsure, feels like she may be leaking     PREGNANCY Vaginal deliveries 2 Tearing Yes: both- 1st/2nd degree C-section deliveries 1 Currently pregnant No   PROLAPSE Sensation of vaginal bulging/pressure, bowling ball sitting in pelvis     OBJECTIVE 12/24/21:    PATIENT SURVEYS:    PFIQ-7: 75 (just urine/bladder)   COGNITION:            Overall cognitive status: Within functional limits for tasks assessed                          SENSATION:            Light touch: Appears intact            Proprioception: Appears intact       GAIT:   Comments: Mild trendelenburg bil                POSTURE: anterior pelvic tilt                   PALPATION:               External Perineal Exam WNL                             Internal Pelvic Floor significant tenderness in Bil deep pelvic floor layers; perineal scar tissue restriction; mild Lt superficial pelvic floor tenderness   Patient confirms identification and approves PT to assess internal pelvic floor and treatment Yes   PELVIC MMT:   Strength 3/5 Endurance 5 seconds Repeat contractions 10 - decreasing ability to  fully relax in between       TONE: High   PROLAPSE: Grade 2 anterior vaginal wall laxity in supine   TODAY'S TREATMENT 01/14/22 Neuromuscular re-education: Core facilitation: Seated march 3 x 10, core bracing Seated horizontal abduction 2 x 10, green band Exercises: Stretches/mobility: Strengthening: Seated clam shells 3 x 10, green band Seated hip adduction 3 x 10 Standing heel raises 3 x 10 Sit<>stand 2 x 10   TREATMENT 01/08/22 Neuromuscular re-education: Core retraining:   Transversus abdominus training with multimodal cues for improved motor control and breath coordination Core facilitation: Resisted march, no diagonal 5 x 5 sec bil Supine march 2 x 10 Supine ball squeeze 10 x 5 sec Self-care: Urge suppression technique Strict bladder retraining   TREATMENT 12/30/21 Manual: Internal pelvic floor techniques: No emotional/communication barriers or cognitive limitation. Patient is motivated to learn. Patient understands and agrees with treatment goals and plan. PT explains patient will be examined in standing, sitting, and lying down to see how their muscles and joints work. When they are ready, they will be asked to remove their underwear so PT can examine their perineum. The patient is also given the option of providing their own chaperone as one is not provided in our facility. The patient also has the right and is explained the right to defer or refuse any part of the evaluation or treatment including the internal exam. With the patient's consent, PT will use one gloved finger to gently assess the muscles of the pelvic floor, seeing how well it contracts and relaxes and if there is muscle symmetry. After, the patient will get dressed and PT and patient will discuss exam findings and plan of care. PT and patient discuss plan of care, schedule, attendance policy and HEP activities. Superficial/deep pelvic floor release Urethral mobilization bil Bladder mobilization with external release at some time Neuromuscular re-education: Down training: Cat/cow 2 x 10 Child's pose 10 breaths Self-care: Benefit of lactation services - referral provided        PATIENT EDUCATION:  Education details: HEP progressions Person educated: Patient Education method: Explanation, Demonstration, Tactile cues, Verbal cues, and Handouts Education comprehension: verbalized understanding     HOME EXERCISE PROGRAM: [id number]   ASSESSMENT:   CLINICAL IMPRESSION: Patient doing  well and seeing progress with urgency and not voiding during the night as frequently (1-2x). She is also able to decrease voiding schedule to every 3 hours while drinking appropriate amount of water. Core strengthening exercises progressed this treatment session to seated and standing activities with excellent tolerance and appropriate challenge. Verbal cues required to get most beneficial breath coordination and core facilitation. She will continue to benefit from skilled PT intervention in order to improve urinary symptoms, decrease low back pain, decrease pelvic/vaginal pressure, and improve QOL.      OBJECTIVE IMPAIRMENTS decreased activity tolerance, decreased coordination, decreased endurance, decreased mobility, decreased ROM, decreased strength, hypomobility, increased fascial restrictions, increased muscle spasms, impaired tone, postural dysfunction, and pain.    ACTIVITY LIMITATIONS lifting, bending, sleeping, and continence   PARTICIPATION LIMITATIONS: interpersonal relationship, community activity, and occupation   PERSONAL FACTORS 1 comorbidity: G3P3 (1 c-section, 2 vaginal deliveries with tearing)  are also affecting patient's functional outcome.    REHAB POTENTIAL: Good   CLINICAL DECISION MAKING: Stable/uncomplicated   EVALUATION COMPLEXITY: Low     GOALS: Goals reviewed with patient? Yes   SHORT TERM GOALS: Target date: 01/21/2022   Pt will be independent with HEP.    Baseline: Goal status: INITIAL  2.  Pt will be independent with the knack, urge suppression technique, and double voiding in order to improve bladder habits and decrease urinary incontinence.    Baseline:  Goal status: INITIAL   3.  Pt will be able to correctly perform diaphragmatic breathing and appropriate pressure management in order to prevent worsening vaginal wall laxity and improve pelvic floor A/ROM.    Baseline:  Goal status: INITIAL     LONG TERM GOALS: Target date: 03/18/2022    Pt will  be independent with advanced HEP.    Baseline:  Goal status: INITIAL   2.  Pt will demonstrate normal pelvic floor muscle tone and A/ROM, able to achieve 4/5 strength with contractions and 10 sec endurance, in order to provide appropriate lumbopelvic support in functional activities.    Baseline:  Goal status: INITIAL   3.  Pt will report no episodes of urinary incontinence in order to improve confidence in community activities and personal hygiene.    Baseline:  Goal status: INITIAL   4.  Pt will decrease frequency of nightly trips to the bathroom to 1 or less in order to get restful sleep.    Baseline:  Goal status: INITIAL   5.  Pt will report 0/10 pain with vaginal penetration and no undesirable leaking in order to improve intimate relationship with partner.     Baseline:  Goal status: INITIAL     PLAN: PT FREQUENCY: 1x/week   PT DURATION: 12 weeks   PLANNED INTERVENTIONS: Therapeutic exercises, Therapeutic activity, Neuromuscular re-education, Balance training, Gait training, Patient/Family education, Joint mobilization, Dry Needling, Biofeedback, and Manual therapy   PLAN FOR NEXT SESSION: Progress mobility and strengthening exercises.    Julio Alm, PT, DPT07/05/232:43 PM

## 2022-01-14 NOTE — Telephone Encounter (Signed)
I called pt back. LVM asking pt to call me back.

## 2022-01-14 NOTE — Telephone Encounter (Signed)
Pt is returning a call phone nurse would like return call.

## 2022-01-16 NOTE — Telephone Encounter (Signed)
Pt has called back for results

## 2022-01-19 NOTE — Telephone Encounter (Signed)
I called pt. No answer, left a message asking pt to call me back.   

## 2022-01-19 NOTE — Telephone Encounter (Signed)
Pt returned call. We discussed her sleep study results. Pt is amenable to proceeding with the autopap. She understands the insurance compliance requirements which includes using the machine at least 4 hours at night and also being seen in the office for an initial f/u between 30 and 90 days after setup. Initial appt scheduled for 03/31/22 at 3:45 PM arrival 15-30 minutes early with machine and power cord. Pt did not have a preference in DME company. Orders faxed to [facility]. Received a receipt of confirmation. Letter sent to pt via mychart per preference. Her questions were answered during the call and she verbalized appreciation.

## 2022-01-21 ENCOUNTER — Ambulatory Visit: Payer: No Typology Code available for payment source

## 2022-01-24 ENCOUNTER — Other Ambulatory Visit: Payer: Self-pay | Admitting: Certified Nurse Midwife

## 2022-01-26 ENCOUNTER — Encounter: Payer: No Typology Code available for payment source | Attending: General Surgery | Admitting: Dietician

## 2022-01-26 ENCOUNTER — Encounter: Payer: Self-pay | Admitting: Dietician

## 2022-01-26 DIAGNOSIS — R7303 Prediabetes: Secondary | ICD-10-CM | POA: Diagnosis not present

## 2022-01-26 DIAGNOSIS — E782 Mixed hyperlipidemia: Secondary | ICD-10-CM | POA: Diagnosis not present

## 2022-01-26 DIAGNOSIS — E669 Obesity, unspecified: Secondary | ICD-10-CM

## 2022-01-26 DIAGNOSIS — D649 Anemia, unspecified: Secondary | ICD-10-CM | POA: Insufficient documentation

## 2022-01-26 DIAGNOSIS — R12 Heartburn: Secondary | ICD-10-CM | POA: Insufficient documentation

## 2022-01-26 DIAGNOSIS — Z713 Dietary counseling and surveillance: Secondary | ICD-10-CM | POA: Insufficient documentation

## 2022-01-26 DIAGNOSIS — R519 Headache, unspecified: Secondary | ICD-10-CM | POA: Insufficient documentation

## 2022-01-26 NOTE — Progress Notes (Signed)
Supervised Weight Loss Visit Bariatric Nutrition Education  NUTRITION ASSESSMENT  6 of 12 SWL  Planned surgery: sleeve gastrectomy   Anthropometrics  Start weight at [facility]: 257.3 lbs (date: 11/12/2021)  Height: 59" Weight: 250.7 lbs BMI: 50.64 kg/m2     Clinical  Medical hx: N/A Medications: N/A  Labs: none update in EMR Notable signs/symptoms: migraines: sates she believes them to be work related stating she has them every day Any previous deficiencies? No  Lifestyle & Dietary Hx  Pt states she is still breast feeding. Pt states she has started some resistance activities. Pt states she did not do well with eating the end of last week, stating she was eating fast food and at the end after not eating most of the day. Pt states she did not feel good after not eating throughout the day and eating fast food when very hungry, stating she feels bloated and energy level is down. Pt states did well cutting out the wine, but states she wants the chocolate. Pt states her juicing is more of a smoothie, stating that she blends the vegetable/fruit.  Pt states she drinks a smoothie about 3-4 glasses/cups a day, before dinner. Pt states she makes a batch of smoothies one time a week.  Pt states her smoothies contain: spinach, kale, cucumber, ginger, tumeric, watermelon, and lemon or lime Pt states she does better during the week when she is drinking her smoothies. Pt states she adds orange juice to her smoothie if it gets too thick during the week. Dietitian discussed a variety of food and pairing her smoothie with protein.  Pt states she will eat tuna or alfalfa sprouts. Pt states she is trying to stop eating by 8 pm.  Pt states she goes to bed from 1-2 am, stating she gets up 7:50 most days.  Estimated daily fluid intake: 126 oz Supplements:  Current average weekly physical activity: Walking after dinner 3 days per week (about 3 miles); resistance 30 reps of 20 lbs, 4 days a week  24-Hr  Dietary Recal wakes around 6am: water First Meal 9: smoothie Snack 11am: smoothie Second Meal 3pm: smoothie Snack 5pm:  smoothie Third Meal: spinach, baked fish, sometimes with potatoes diced (roasted) or steak and potatoes or chinese food Snack: chocoalte  Beverages: bottle of wine over 2 days, water  Estimated Energy Needs Calories: 1500   NUTRITION DIAGNOSIS  Overweight/obesity (Union-3.3) related to past poor dietary habits and physical inactivity as evidenced by patient w/ planned sleeve gastrectomy surgery following dietary guidelines for continued weight loss.   NUTRITION INTERVENTION  Nutrition counseling (C-1) and education (E-2) to facilitate bariatric surgery goals.  Pre-Op Goals Progress & New Goals Continue: Stop at fullness by putting smaller portions on your plate Continue: getting consistent with changes already made Continue: chew each bite well Continue: fill out the mindful meals sheet Continue: keep walking after dinner. continue:  Saying "I'm full" out loud (like Sister does) Continue: focus on those 3 meals for dinner on rotation until next visit, to get a variety of foods. New: pair smoothie with a protein food  Handouts Provided Include  Mindful Meals Sheet  Learning Style & Readiness for Change Teaching method utilized: Visual & Auditory  Demonstrated degree of understanding via: Teach Back  Readiness Level: preparation Barriers to learning/adherence to lifestyle change: none identified   RD's Notes for next Visit  Assess pts adherence to chosen goals   MONITORING & EVALUATION Dietary intake, weekly physical activity, body weight, and pre-op goals  Next Steps  Patient is to return to [facility] in 1-2 weeks for next visit

## 2022-01-28 ENCOUNTER — Ambulatory Visit: Payer: No Typology Code available for payment source

## 2022-01-28 DIAGNOSIS — M62838 Other muscle spasm: Secondary | ICD-10-CM

## 2022-01-28 DIAGNOSIS — R279 Unspecified lack of coordination: Secondary | ICD-10-CM | POA: Diagnosis not present

## 2022-01-28 DIAGNOSIS — R293 Abnormal posture: Secondary | ICD-10-CM

## 2022-01-28 DIAGNOSIS — M6281 Muscle weakness (generalized): Secondary | ICD-10-CM | POA: Diagnosis not present

## 2022-01-28 NOTE — Therapy (Addendum)
OUTPATIENT PHYSICAL THERAPY TREATMENT NOTE   Patient Name: Colleen Fields MRN: [id number] DOB:[birth year], 31 y.o., female Today's Date: 01/28/2022  PCP: None REFERRING PROVIDER: Deloris Ping, CNM  END OF SESSION:   PT End of Session - 01/28/22 1236     Visit Number 5    Date for PT Re-Evaluation 03/18/22    Authorization Type [employer]    Authorization Time Period 12/24/21-02/21/22    Authorization - Visit Number 4    Authorization - Number of Visits 8    PT Start Time 4098    PT Stop Time 1313    PT Time Calculation (min) 38 min    Activity Tolerance Patient tolerated treatment well    Behavior During Therapy [facility] for tasks assessed/performed                Past Medical History:  Diagnosis Date   Chest pain    Depression    Gestational diabetes    insulin   History of gestational diabetes mellitus 09/05/2017   '[ ]'$  qyear a1c  09/2020: passed 2h post partum GTT   History of severe pre-eclampsia 09/02/2020   Postpartum readmission for MGSO4   Migraine    Obesity 07/22/2017   Palpitations    Tachycardia    Past Surgical History:  Procedure Laterality Date   CESAREAN SECTION     WISDOM TOOTH EXTRACTION     Patient Active Problem List   Diagnosis Date Noted   History of migraine 09/12/2020   Blood transfusion declined because patient is Jehovah's Witness 08/24/2020   BMI 45.0-49.9, adult ([place]) 08/21/2020   Obstructive sleep apnea on CPAP 02/28/2020   Anxiety and depression 12/09/2017    REFERRING DIAG: M62.89 (ICD-10-CM) - Pelvic floor dysfunction in female  THERAPY DIAG:  Muscle weakness (generalized)  Unspecified lack of coordination  Other muscle spasm  Abnormal posture  Rationale for Evaluation and Treatment Rehabilitation  PERTINENT HISTORY: C-section and 2 vaginal deliveries  PRECAUTIONS: NA  SUBJECTIVE: Pt states that she has not had any episodes of urinary incontinence and is using urge suppression technique; however,  she still has to start heading to the bathroom 5-10 minutes after she gets the urge. She went over 4 hours this morning, but did not have anything to drink this morning - states that it was still a full empty. She has not had intercourse recently. She has just found out that she has sleep apnea; she is waiting on getting CPAP machine. She has started adding weighted exercises into work out routine. She denies any sensation of vaginal bulging or pressure any more.   PAIN:  Are you having pain? No   SUBJECTIVE 12/24/21:  SUBJECTIVE STATEMENT: After delivery of first child in 2019 she started having urinary incontinence. After second vaginal delivery in 2021, she feels like she has to urinate all the time and when she sits on the toilet she feels significant pressure and has to readjust when on toilet. Patient delivered third child on 08/24/20. 2 boys, one girl     Fluid intake: Yes: Almost a gallon of water a day       PAIN:  Are you having pain? Yes NPRS scale: 7/10 Pain location:  low back   Pain type: aching Pain description: constant    Aggravating factors: standing longer than 5 minutes - needs to lean on cart Relieving factors: leaning forward with support   PRECAUTIONS: None   WEIGHT BEARING RESTRICTIONS No   FALLS:  Has patient fallen in last 6 months? No   LIVING ENVIRONMENT: Lives with: lives with their family Lives in: House/apartment     OCCUPATION: works from home, Civil engineer, contracting   PLOF: Independent   PATIENT GOALS get rid of pressure, help low back pain, leaking   PERTINENT HISTORY:  C-section and 2 vaginal deliveries Sexual abuse: No   BOWEL MOVEMENT Pain with bowel movement: No Type of bowel movement:Frequency 1-2x/day and Strain No Constipation started after cleanse  at the beginning of the year Fully empty rectum: Yes: - Leakage: No Pads: No Fiber supplement: No   URINATION Pain with urination: No Fully empty bladder: Yes: unsure, has to urinate 30 minutes after she goes Stream:  WNL Urgency: Yes: - Frequency: 10x/day - in 2 hours, she will go 2-3 times Leakage: Urge to void, Walking to the bathroom, Coughing, Sneezing, Laughing, Exercise, Lifting, and Bending forward Pads: Yes: panty liners   does not always wear underwear to make it easier to get to the bathroom without leaking   INTERCOURSE Pain with intercourse: Initial Penetration - she feels like she has to brace at first Ability to have vaginal penetration:  Yes: - Climax: yes - unsure, feels like she may be leaking     PREGNANCY Vaginal deliveries 2 Tearing Yes: both- 1st/2nd degree C-section deliveries 1 Currently pregnant No   PROLAPSE Sensation of vaginal bulging/pressure, bowling ball sitting in pelvis     OBJECTIVE 12/24/21:    PATIENT SURVEYS:    PFIQ-7: 75 (just urine/bladder)   COGNITION:            Overall cognitive status: Within functional limits for tasks assessed                          SENSATION:            Light touch: Appears intact            Proprioception: Appears intact       GAIT:   Comments: Mild trendelenburg bil                POSTURE: anterior pelvic tilt                   PALPATION:               External Perineal Exam WNL                             Internal Pelvic Floor significant tenderness in Bil deep pelvic floor layers; perineal scar tissue restriction; mild Lt superficial pelvic floor tenderness   Patient confirms identification  and approves PT to assess internal pelvic floor and treatment Yes   PELVIC MMT:   Strength 3/5 Endurance 5 seconds Repeat contractions 10 - decreasing ability to fully relax in between       TONE: High   PROLAPSE: Grade 2 anterior vaginal wall laxity in supine   TODAY'S TREATMENT  01/28/22 Neuromuscular re-education: Core facilitation: Bil UE extension green band, 2 x 10 Pallof press 10x bil green band Leg extensions 10x bil Self-care: Reviewed strict bladder retraining Increasing water intake slowly throughout the day Vulvovaginal massage Working on vaginal desensitization, possibly incorporating partner into this treatment   TREATMENT 01/14/22 Neuromuscular re-education: Core facilitation: Seated march 3 x 10, core bracing Seated horizontal abduction 2 x 10, green band Exercises: Strengthening: Seated clam shells 3 x 10, green band Seated hip adduction 3 x 10 Standing heel raises 3 x 10 Sit<>stand 2 x 10   TREATMENT 01/08/22 Neuromuscular re-education: Core retraining:  Transversus abdominus training with multimodal cues for improved motor control and breath coordination Core facilitation: Resisted march, no diagonal 5 x 5 sec bil Supine march 2 x 10 Supine ball squeeze 10 x 5 sec Self-care: Urge suppression technique Strict bladder retraining        PATIENT EDUCATION:  Education details: HEP progressions Person educated: Patient Education method: Explanation, Demonstration, Tactile cues, Verbal cues, and Handouts Education comprehension: verbalized understanding     HOME EXERCISE PROGRAM: [id number]   ASSESSMENT:   CLINICAL IMPRESSION: Pt overall doing very well. She is sleeping through the nights most nights, reports no vaginal pressure, and no episodes of urinary incontinence. She is still having urgency, but it seems most likely associated with holding urine too long; we reviewed bladder retraining and goal of 3 hour voiding schedule to help with this. We discussed benefit of vulvovaignal massage/moisturization and working with husband on vaginal desensitization. We discussed that she may benefit from estrogen cream due to vaginal dryness/sensitivity; she was encouraged to talk with MD if coconut oil does not help with these symptoms. She  did very well with all exercise progressions with good core activation. She will continue to benefit from skilled PT intervention in order to improve urinary symptoms, decrease low back pain, decrease pelvic/vaginal pressure, and improve QOL.      OBJECTIVE IMPAIRMENTS decreased activity tolerance, decreased coordination, decreased endurance, decreased mobility, decreased ROM, decreased strength, hypomobility, increased fascial restrictions, increased muscle spasms, impaired tone, postural dysfunction, and pain.    ACTIVITY LIMITATIONS lifting, bending, sleeping, and continence   PARTICIPATION LIMITATIONS: interpersonal relationship, community activity, and occupation   PERSONAL FACTORS 1 comorbidity: G3P3 (1 c-section, 2 vaginal deliveries with tearing)  are also affecting patient's functional outcome.    REHAB POTENTIAL: Good   CLINICAL DECISION MAKING: Stable/uncomplicated   EVALUATION COMPLEXITY: Low     GOALS: Goals reviewed with patient? Yes   SHORT TERM GOALS: Target date: 01/21/2022 - updated 01/28/22   Pt will be independent with HEP.    Baseline: Goal status: MET 01/28/22   2.  Pt will be independent with the knack, urge suppression technique, and double voiding in order to improve bladder habits and decrease urinary incontinence.    Baseline:  Goal status: MET 01/28/22   3.  Pt will be able to correctly perform diaphragmatic breathing and appropriate pressure management in order to prevent worsening vaginal wall laxity and improve pelvic floor A/ROM.    Baseline:  Goal status: MET 01/28/22     LONG TERM GOALS: Target date: 03/18/2022 - updated  01/28/22   Pt will be independent with advanced HEP.    Baseline:  Goal status: IN PROGRESS   2.  Pt will demonstrate normal pelvic floor muscle tone and A/ROM, able to achieve 4/5 strength with contractions and 10 sec endurance, in order to provide appropriate lumbopelvic support in functional activities.    Baseline:  Goal  status: IN PROGRESS   3.  Pt will report no episodes of urinary incontinence in order to improve confidence in community activities and personal hygiene.    Baseline:  Goal status: IN PROGRESS   4.  Pt will decrease frequency of nightly trips to the bathroom to 1 or less in order to get restful sleep.    Baseline: 0-1x/night Goal status: MET 01/27/22   5.  Pt will report 0/10 pain with vaginal penetration and no undesirable leaking in order to improve intimate relationship with partner.     Baseline:  Goal status: IN PROGRESS     PLAN: PT FREQUENCY: 1x/week   PT DURATION: 12 weeks   PLANNED INTERVENTIONS: Therapeutic exercises, Therapeutic activity, Neuromuscular re-education, Balance training, Gait training, Patient/Family education, Joint mobilization, Dry Needling, Biofeedback, and Manual therapy   PLAN FOR NEXT SESSION: Progress mobility and strengthening exercises; return to internal vaginal exam and pelvic floor muscle release.    Heather Roberts, PT, DPT07/19/231:40 PM    PHYSICAL THERAPY DISCHARGE SUMMARY  Visits from Start of Care: 5  Current functional level related to goals / functional outcomes: Pt called clinic requesting DC due to difficulty with transportation to appointments. Clinician providing care out of clinic on medical leave currently.    Remaining deficits: Unable to assess as pt unable to return for visits    Education / Equipment: HEP   Patient agrees to discharge. Patient goals were not met. Patient is being discharged due to the patient's request.   Stacy Gardner, PT, DPT 08/03/232:56 PM

## 2022-02-04 ENCOUNTER — Ambulatory Visit: Payer: No Typology Code available for payment source

## 2022-02-09 ENCOUNTER — Ambulatory Visit: Payer: No Typology Code available for payment source | Admitting: Dietician

## 2022-02-11 ENCOUNTER — Ambulatory Visit: Payer: No Typology Code available for payment source

## 2022-02-12 ENCOUNTER — Encounter: Payer: Self-pay | Admitting: Certified Nurse Midwife

## 2022-02-18 ENCOUNTER — Ambulatory Visit: Payer: No Typology Code available for payment source | Admitting: Physical Therapy

## 2022-02-23 ENCOUNTER — Encounter: Payer: No Typology Code available for payment source | Attending: General Surgery | Admitting: Dietician

## 2022-02-23 ENCOUNTER — Encounter: Payer: Self-pay | Admitting: Dietician

## 2022-02-23 DIAGNOSIS — E669 Obesity, unspecified: Secondary | ICD-10-CM | POA: Diagnosis present

## 2022-02-23 NOTE — Progress Notes (Signed)
Supervised Weight Loss Visit Bariatric Nutrition Education  NUTRITION ASSESSMENT  7 of 12 SWL  Planned surgery: sleeve gastrectomy   Anthropometrics  Start weight at [facility]: 257.3 lbs (date: 11/12/2021)  Height: 59" Weight: 244.9 lbs BMI: 49.46 kg/m2     Clinical  Medical hx: N/A Medications: N/A (prenatal vitamin) Labs: none update in EMR Notable signs/symptoms: migraines: sates she believes them to be work related stating she has them every day Any previous deficiencies? No  Lifestyle & Dietary Hx  Pt states she has been getting up at 9 am now, and states she does not feel like she needs a nap.  Pt previously stated that she went to be around 12-1 am. Pt states she is always thirsty, even though she gets well above 64 oz of water. Pt states she is still breast feeding. Pt states she has been wt training (resistance training), stating she is not exercising as long or as often, states she is getting in some exercise. Pt states she is still cutting down on wine, stating she does not have it every weekend. Pt states she is adding chocolate to a nut mixed to reduce her chocolate intake. Pt willing to try not drinking with meals and not drink for 30 minutes after the meal, stating that will be difficult.  Estimated daily fluid intake: 126 oz Supplements: prenatal vitamin Current average weekly physical activity: Walking after dinner 3 days per week (about 3 miles); resistance 30 reps of 20 lbs, 4 days a week  24-Hr Dietary Recal wakes around 9 am: water First Meal 11-12: 2 eggs with cheese Snack 11am:  Second Meal 3pm: sandwich (pita bread), cheese, lettuce, tomato Snack 5pm:  Third Meal: spinach, baked fish, sometimes with potatoes diced (roasted) or steak and potatoes or Chipotle, crabs, jamaican food Snack: chocolate and nuts Beverages: water, orange juice  Estimated Energy Needs Calories: 1500   NUTRITION DIAGNOSIS  Overweight/obesity ([place]-3.3) related to past poor  dietary habits and physical inactivity as evidenced by patient w/ planned sleeve gastrectomy surgery following dietary guidelines for continued weight loss.   NUTRITION INTERVENTION  Nutrition counseling (C-1) and education (E-2) to facilitate bariatric surgery goals.  Pre-Op Goals Progress & New Goals Continue: Stop at fullness by putting smaller portions on your plate Continue: getting consistent with changes already made Continue: chew each bite well Continue: fill out the mindful meals sheet Continue: keep walking after dinner. continue:  Saying "I'm full" out loud (like Sister does) Continue: focus on those 3 meals for dinner on rotation until next visit, to get a variety of foods. Continue: if drinking smoothies, pair with a protein food New: do not drink 15 minutes before meal; during meal; or 30 minutes after meal.  Handouts Provided Include   Why you need complex carbohydrates: Whole grains and other complex carbohydrates are required to have a healthy diet. Whole grains provide fiber which can help with blood glucose levels and help keep you satiated. Fruits and starchy vegetables provide essential vitamins and minerals required for immune function, eyesight support, brain support, bone density, wound healing and many other functions within the body. According to the current evidenced based 2020-2025 Dietary Guidelines for Americans, complex carbohydrates are part of a healthy eating pattern which is associated with a decreased risk for type 2 diabetes, cancers, and cardiovascular disease.    Learning Style & Readiness for Change Teaching method utilized: Visual & Auditory  Demonstrated degree of understanding via: Teach Back  Readiness Level: preparation Barriers to learning/adherence to  lifestyle change: none identified   RD's Notes for next Visit  Assess pts adherence to chosen goals   MONITORING & EVALUATION Dietary intake, weekly physical activity, body weight, and  pre-op goals   Next Steps  Patient is to return to [facility] in 1-2 weeks for next visit

## 2022-02-25 ENCOUNTER — Ambulatory Visit: Payer: No Typology Code available for payment source | Admitting: Physical Therapy

## 2022-03-10 ENCOUNTER — Ambulatory Visit: Payer: No Typology Code available for payment source | Admitting: Dietician

## 2022-03-11 ENCOUNTER — Encounter: Payer: No Typology Code available for payment source | Attending: General Surgery | Admitting: Skilled Nursing Facility1

## 2022-03-11 ENCOUNTER — Encounter: Payer: Self-pay | Admitting: Skilled Nursing Facility1

## 2022-03-11 DIAGNOSIS — E669 Obesity, unspecified: Secondary | ICD-10-CM | POA: Diagnosis present

## 2022-03-11 NOTE — Progress Notes (Signed)
Supervised Weight Loss Visit Bariatric Nutrition Education  NUTRITION ASSESSMENT  8 of 12 SWL  Planned surgery: sleeve gastrectomy   Anthropometrics  Start weight at [facility]: 257.3 lbs (date: 11/12/2021)   Weight: virtual appt; pt identified by name and DOB, pt agreeable to limitations of this visit type   Clinical  Medical hx: N/A Medications: N/A (prenatal vitamin) Labs: none update in EMR Notable signs/symptoms: migraines: sates she believes them to be work related stating she has them every day Any previous deficiencies? No  Lifestyle & Dietary Hx  Currently Breast feeding.   Pt states she is struggling with consistently meal prepping her meals: Dietitian offered strategies to assist in her success: shop for staples such as tacos or humus and veggies, trust in yourself knowing you are making healthy food decisions, read the nutrition facts label (pt states that is a lot of time), create 3 meals for each week to have on rotation for dinner to reduce boredom increase nutrition and assist with decision fatigue by the end of the day.   Pt state she is physically and mentally drained stating she thinks that is why she has not been emotionally eating but is hopeful she is not emotionally eating duet o having been working on it.  Pt states she does have sleep ap[nea but needs to save up for the machine. Pt states she has been bleeding a lot and thinks she may be so tired in part to anemia: Dietitian advised to call her PCP for a CBC if she thinks that would help.   Educated pt on the effects of drinking alcohol after surgery and advised of the appropriate serving size in one day as well as the decision making process   Estimated daily fluid intake: 126 oz Supplements: prenatal vitamin Current average weekly physical activity: Walking after dinner 3 days per week (about 3 miles); resistance 30 reps of 20 lbs, 4 days a week  24-Hr Dietary Recal wakes around 9 am: premier protein First  Meal 11-12: 2 eggs with cheese + veggies Snack 11am:  Second Meal 3pm: hummus tomato cucumber sometimes a turley sandwich Snack 5pm:  Third Meal: yam + pumpkin + dumplings + baked fish + back choy + toamto and onion Snack: chocolate and nuts Beverages: water, orange juice, wine  Estimated Energy Needs Calories: 1500   NUTRITION DIAGNOSIS  Overweight/obesity ([place]-3.3) related to past poor dietary habits and physical inactivity as evidenced by patient w/ planned sleeve gastrectomy surgery following dietary guidelines for continued weight loss.   NUTRITION INTERVENTION  Nutrition counseling (C-1) and education (E-2) to facilitate bariatric surgery goals.  Pre-Op Goals Progress & New Goals Continue: Stop at fullness by putting smaller portions on your plate Continue: getting consistent with changes already made Continue: chew each bite well Continue: fill out the mindful meals sheet Continue: keep walking after dinner. continue:  Saying "I'm full" out loud (like Sister does) Continue: focus on those 3 meals for dinner on rotation until next visit, to get a variety of foods. Continue: if drinking smoothies, pair with a protein food New: do not drink 15 minutes before meal; during meal; or 30 minutes after meal. NEW: create some go to dinners  Handouts Provided Include   Why you need complex carbohydrates: Whole grains and other complex carbohydrates are required to have a healthy diet. Whole grains provide fiber which can help with blood glucose levels and help keep you satiated. Fruits and starchy vegetables provide essential vitamins and minerals required for immune  function, eyesight support, brain support, bone density, wound healing and many other functions within the body. According to the current evidenced based 2020-2025 Dietary Guidelines for Americans, complex carbohydrates are part of a healthy eating pattern which is associated with a decreased risk for type 2 diabetes,  cancers, and cardiovascular disease.    Learning Style & Readiness for Change Teaching method utilized: Visual & Auditory  Demonstrated degree of understanding via: Teach Back  Readiness Level: preparation Barriers to learning/adherence to lifestyle change: none identified   RD's Notes for next Visit  Assess pts adherence to chosen goals   MONITORING & EVALUATION Dietary intake, weekly physical activity, body weight, and pre-op goals   Next Steps  Patient is to return to [facility] in 1-2 weeks for next visit

## 2022-03-26 ENCOUNTER — Encounter: Payer: Self-pay | Admitting: Dietician

## 2022-03-26 ENCOUNTER — Encounter: Payer: No Typology Code available for payment source | Attending: General Surgery | Admitting: Dietician

## 2022-03-26 DIAGNOSIS — E669 Obesity, unspecified: Secondary | ICD-10-CM | POA: Insufficient documentation

## 2022-03-26 NOTE — Progress Notes (Signed)
Supervised Weight Loss Visit Bariatric Nutrition Education  NUTRITION ASSESSMENT  9 of 12 SWL  Planned surgery: sleeve gastrectomy   Anthropometrics  Start weight at [facility]: 257.3 lbs (date: 11/12/2021)  Weight today: 242.6 BMI: 49.00  Weight: virtual appt; pt identified by name and DOB, pt agreeable to limitations of this visit type   Clinical  Medical hx: N/A Medications: N/A (prenatal vitamin) Labs: none update in EMR Notable signs/symptoms: migraines: sates she believes them to be work related stating she has them every day Any previous deficiencies? No  Lifestyle & Dietary Hx  Currently Breast feeding.  Pt states she is struggling with not drinking with meals or snacks. Pt states the other things like chewing well and listening to her body when she is full, has been easier than not drinking with meals. Pt states she has been diagnosed with sleep apnea and is anticipating getting a C-PAP. Pt states she is starting to drink the Premier Protein shakes for breakfast. Pt states it is hard to remember all the changes she needs to be making. Pt states her husband was out of town, stating she had Chipotle every night for several days straight.  Pt states her husband cooked as well, so she didn't need to have meals on hand. Pt agreed that meals prepped in freezer would help when she has to cook.   Estimated daily fluid intake: 126 oz Supplements: prenatal vitamin Current average weekly physical activity: Walking after dinner 3 days per week (about 3 miles); resistance 30 reps of 20 lbs, 4 days a week  24-Hr Dietary Recal wakes around 9 am: premier protein First Meal 11-12:  Snack 11am:  Second Meal 3pm: yogurt Snack 5pm: nuts Third Meal: yam + pumpkin + dumplings + baked fish + back choy + toamto and onion or Jamaican soup and fish Snack:  Beverages: water, orange juice, wine  Estimated Energy Needs Calories: 1500   NUTRITION DIAGNOSIS  Overweight/obesity ([place]-3.3)  related to past poor dietary habits and physical inactivity as evidenced by patient w/ planned sleeve gastrectomy surgery following dietary guidelines for continued weight loss.   NUTRITION INTERVENTION  Nutrition counseling (C-1) and education (E-2) to facilitate bariatric surgery goals.  Pre-Op Goals Progress & New Goals Continue: Stop at fullness by putting smaller portions on your plate Continue: getting consistent with changes already made Continue: chew each bite well Continue: fill out the mindful meals sheet Continue: keep walking after dinner. continue:  Saying "I'm full" out loud (like Sister does) Continue: focus on those 3 meals for dinner on rotation until next visit, to get a variety of foods. Continue: if drinking smoothies, pair with a protein food Re-engage/continue: do not drink 15 minutes before meal; during meal; or 30 minutes after meal. NEW: re-engage: meal prep several dinner meals to have on hand (freezer) as needed. New/Re-engage: increase physical activity.  Handouts Provided Include    Learning Style & Readiness for Change Teaching method utilized: Visual & Auditory  Demonstrated degree of understanding via: Teach Back  Readiness Level: preparation Barriers to learning/adherence to lifestyle change: none identified   RD's Notes for next Visit  Assess pts adherence to chosen goals   MONITORING & EVALUATION Dietary intake, weekly physical activity, body weight, and pre-op goals   Next Steps  Patient is to return to [facility] in 1-2 weeks for next visit

## 2022-03-27 ENCOUNTER — Other Ambulatory Visit: Payer: Self-pay | Admitting: Physician Assistant

## 2022-03-27 DIAGNOSIS — N6324 Unspecified lump in the left breast, lower inner quadrant: Secondary | ICD-10-CM

## 2022-03-30 NOTE — Progress Notes (Deleted)
[facility] [address] [place]. [place] [address] [phone]       OFFICE FOLLOW UP NOTE  [name] Date of Birth:  [birth year] Medical Record Number:  [id number]   Reason for visit: Initial CPAP follow-up    SUBJECTIVE:   CHIEF COMPLAINT:  No chief complaint on file.   HPI:   Update 03/31/2022 JM: Patient returns for initial CPAP compliance visit.  Completed HST 6/27 which showed overall mild sleep apnea with total AHI of 9.4/h and O2 nadir of 86%.  Given medical history and sleep-related complaints, recommend initiation of AutoPap which was started ***.       Consult visit 12/10/2021 Dr. Rexene Fields: [name] is a 31 year old right-handed woman with an underlying medical history of anemia, prediabetes, hyperlipidemia, enlarged pituitary gland, migraines, palpitations, history of preeclampsia, history of chest pain, depression, and morbid obesity with a BMI of 50, who reports snoring and excessive daytime somnolence, as well as recurrent headaches.  I reviewed your office note from 10/02/2021.  She is being considered for bariatric surgery, in particular laparoscopic sleeve gastrectomy.  Her Epworth sleepiness score is 18 out of 24, fatigue severity score is 60 out of 63.  Her sleep-related issues have become worse in the past couple of years, especially since her last pregnancy.  She continues to breast-feed her 32-year-old.  She lives with her family including husband, 32 year old, 74-year-old and 57-year-old.  She has taken a nap during the day, she works from home and takes a nap during her break.  She has been working as a Biomedical engineer.  She does have a TV in her bedroom but turns it off at night.  She has no pets in the household, bedtime generally around 10 PM and she is typically asleep by 10:30 PM.  Rise time is between 5 and 6 AM.  She does not currently drink any caffeine and drinks alcohol in the form of wine, 1 glass on the  weekend.  She has smoked marijuana in her 28s, not in the recent past.  She is a non-smoker of cigarettes and does not use any nicotine products.  Her mother has sleep apnea and maternal uncle has sleep apnea as well.  She has woken up with a sense of choking and with morning headaches.  She has nocturia about 3 times per average night.     ROS:   14 system review of systems performed and negative with exception of ***  PMH:  Past Medical History:  Diagnosis Date   Chest pain    Depression    Gestational diabetes    insulin   History of gestational diabetes mellitus 09/05/2017   [ ]  qyear a1c  09/2020: passed 2h post partum GTT   History of severe pre-eclampsia 09/02/2020   Postpartum readmission for MGSO4   Migraine    Obesity 07/22/2017   Palpitations    Tachycardia     PSH:  Past Surgical History:  Procedure Laterality Date   CESAREAN SECTION     WISDOM TOOTH EXTRACTION      Social History:  Social History   Socioeconomic History   Marital status: Married    Spouse name: Not on file   Number of children: Not on file   Years of education: Not on file   Highest education level: Not on file  Occupational History   Not on file  Tobacco Use   Smoking status: Never   Smokeless tobacco: Never  Vaping Use  Vaping Use: Never used  Substance and Sexual Activity   Alcohol use: No   Drug use: Not Currently    Types: Marijuana    Comment: "hasn't smoked in forever"   Sexual activity: Not Currently    Birth control/protection: None  Other Topics Concern   Not on file  Social History Narrative   Not on file   Social Determinants of Health   Financial Resource Strain: Not on file  Food Insecurity: No Food Insecurity (09/23/2020)   Hunger Vital Sign    Worried About Running Out of Food in the Last Year: Never true    Ran Out of Food in the Last Year: Never true  Transportation Needs: Unmet Transportation Needs (09/23/2020)   PRAPARE - Scientist, research (physical sciences) (Medical): Yes    Lack of Transportation (Non-Medical): Yes  Physical Activity: Not on file  Stress: Not on file  Social Connections: Not on file  Intimate Partner Violence: Not on file    Family History:  Family History  Problem Relation Age of Onset   Hypertension Mother    Bipolar disorder Father    Schizophrenia Father    Sleep apnea Maternal Uncle     Medications:   Current Outpatient Medications on File Prior to Visit  Medication Sig Dispense Refill   acetaminophen (TYLENOL) 325 MG tablet Take 3 tablets (975 mg total) by mouth every 6 (six) hours as needed. (Patient not taking: Reported on 12/24/2021)     amLODipine (NORVASC) 5 MG tablet TAKE 1 TABLET (5 MG TOTAL) BY MOUTH DAILY. 90 tablet 1   calcium carbonate (TUMS - DOSED IN MG ELEMENTAL CALCIUM) 500 MG chewable tablet Chew 3 tablets by mouth daily as needed for indigestion or heartburn. (Patient not taking: Reported on 12/24/2021)     ferrous sulfate (FERROUSUL) 325 (65 FE) MG tablet Take 1 tablet (325 mg total) by mouth every other day. (Patient not taking: Reported on 12/24/2021) 30 tablet 3   ibuprofen (ADVIL) 600 MG tablet Take 1 tablet (600 mg total) by mouth every 6 (six) hours as needed. (Patient not taking: Reported on 12/24/2021) 30 tablet 0   Magnesium Malate 1250 (141.7 Mg) MG TABS Take 1 tablet by mouth daily. You can do this or the Magnesium oxide. Choose whichever one is available at the pharmacy (Patient not taking: Reported on 12/24/2021)     Magnesium Oxide 400 (240 Mg) MG TABS Take 1 tablet (400 mg total) by mouth daily. You can do this or the Magnesium malate. Choose whichever one is available at the pharmacy (Patient not taking: Reported on 12/24/2021) 30 tablet    naproxen sodium (ANAPROX DS) 550 MG tablet Take 1 tablet (550 mg total) by mouth 2 (two) times daily with a meal. For heavy menstrual bleeding or for pain as needed 30 tablet 2   Prenatal Vit-Fe Fumarate-FA (PRENATAL MULTIVITAMIN) TABS  tablet Take 1 tablet by mouth daily at 12 noon. (Patient not taking: Reported on 01/09/2022)     tranexamic acid (LYSTEDA) 650 MG TABS tablet Take 2 tablets (1,300 mg total) by mouth 3 (three) times daily. Take during menses for a maximum of five days 30 tablet 2   No current facility-administered medications on file prior to visit.    Allergies:  No Known Allergies    OBJECTIVE:  Physical Exam  There were no vitals filed for this visit. There is no height or weight on file to calculate BMI. No results found.   General: well  developed, well nourished, seated, in no evident distress Head: head normocephalic and atraumatic.   Neck: supple with no carotid or supraclavicular bruits Cardiovascular: regular rate and rhythm, no murmurs Musculoskeletal: no deformity Skin:  no rash/petichiae Vascular:  Normal pulses all extremities   Neurologic Exam Mental Status: Awake and fully alert. Oriented to place and time. Recent and remote memory intact. Attention span, concentration and fund of knowledge appropriate. Mood and affect appropriate.  Cranial Nerves: Pupils equal, briskly reactive to light. Extraocular movements full without nystagmus. Visual fields full to confrontation. Hearing intact. Facial sensation intact. Face, tongue, palate moves normally and symmetrically.  Motor: Normal bulk and tone. Normal strength in all tested extremity muscles Sensory.: intact to touch , pinprick , position and vibratory sensation.  Coordination: Rapid alternating movements normal in all extremities. Finger-to-nose and heel-to-shin performed accurately bilaterally. Gait and Station: Arises from chair without difficulty. Stance is normal. Gait demonstrates normal stride length and balance without use of AD. Tandem walk and heel toe without difficulty.  Reflexes: 1+ and symmetric. Toes downgoing.         ASSESSMENT/PLAN: Colleen Fields is a 31 y.o. year old female     OSA on CPAP :  Compliance report shows satisfactory usage with optimal residual AHI.  Discussed continued nightly usage with ensuring greater than 4 hours nightly for optimal benefit and per insurance purposes.  Continue to follow with DME company for any needed supplies or CPAP related concerns     Follow up in *** or call earlier if needed   CC:  PCP: Patient, [name]    I spent *** minutes of face-to-face and non-face-to-face time with patient.  This included previsit chart review, lab review, study review, order entry, electronic health record documentation, patient education regarding diagnosis of sleep apnea with review and discussion of compliance report and answered all other questions to patient's satisfaction   Ihor Austin, [facility]  [facility] [address] [place], [place] [address]  Phone [phone] Fax [phone] Note: This document was prepared with digital dictation and possible smart phrase technology. Any transcriptional errors that result from this process are unintentional.

## 2022-03-31 ENCOUNTER — Ambulatory Visit: Payer: No Typology Code available for payment source | Admitting: Adult Health

## 2022-04-09 ENCOUNTER — Encounter: Payer: Self-pay | Admitting: Dietician

## 2022-04-09 ENCOUNTER — Encounter: Payer: No Typology Code available for payment source | Admitting: Dietician

## 2022-04-09 DIAGNOSIS — E669 Obesity, unspecified: Secondary | ICD-10-CM | POA: Diagnosis not present

## 2022-04-09 NOTE — Progress Notes (Signed)
Supervised Weight Loss Visit Bariatric Nutrition Education  NUTRITION ASSESSMENT  10 of 12 SWL  Planned surgery: sleeve gastrectomy   Anthropometrics  Start weight at [facility]: 257.3 lbs (date: 11/12/2021)  Height: 59 in Weight today: 245.1 lbs BMI:  49.50   Clinical  Medical hx: depression, Gestational DM Medications: prenatal vitamin Labs: none update in EMR Notable signs/symptoms: migraines: sates she believes them to be work related stating she has them every day Any previous deficiencies? No  Lifestyle & Dietary Hx  Pt states her husband has been doing the cooking since last visit, stating they are doing vegetarian this week.  Pt states it helps her stress to have her husband cook. Pt states the chocolate craving has gone out of nowhere, stating she is surprised.  Pt states she has not increased her physical activity, stating she has been putting it off.  Pt states she still wants to keep physical activity as a goal to improve. Pt states she is planning to continue breast feeding after surgery at this point.  We can evaluate when surgery is scheduled.   Pt states she snacks all day, and pt and dietitian realized that she is mostly grazing.  Dietitian encouraged pt to designate times for meals and snacks to avoid grazing.  Estimated daily fluid intake: 126 oz Supplements: prenatal vitamin Current average weekly physical activity: ADLs;  24-Hr Dietary Recal wakes around 9 am: bacon and egg  First Meal 11-12: bacon and egg Snack 11am: yogurt Second Meal 3pm: humus and tomatoes and cucumbers Snack 5pm: nuts Third Meal: yam + pumpkin + dumplings + baked fish + back choy + toamto and onion or Jamaican soup and fish Snack:  Beverages: water, orange juice  Estimated Energy Needs Calories: 1500   NUTRITION DIAGNOSIS  Overweight/obesity ([place]-3.3) related to past poor dietary habits and physical inactivity as evidenced by patient w/ planned sleeve gastrectomy surgery following  dietary guidelines for continued weight loss.   NUTRITION INTERVENTION  Nutrition counseling (C-1) and education (E-2) to facilitate bariatric surgery goals.  Pre-Op Goals Progress & New Goals Continue: Stop at fullness by putting smaller portions on your plate Continue: getting consistent with changes already made Continue: chew each bite well Continue: fill out the mindful meals sheet Continue: keep walking after dinner. continue:  Saying "I'm full" out loud (like Sister does) Continue: focus on those 3 meals for dinner on rotation until next visit, to get a variety of foods. Continue: if drinking smoothies, pair with a protein food Re-engage/continue: do not drink 15 minutes before meal; during meal; or 30 minutes after meal. re-engage: meal prep several dinner meals to have on hand (freezer) as needed. Re-engage: increase physical activity. New: avoid grazing  Handouts Provided Include    Learning Style & Readiness for Change Teaching method utilized: Visual & Auditory  Demonstrated degree of understanding via: Teach Back  Readiness Level: preparation Barriers to learning/adherence to lifestyle change: none identified   RD's Notes for next Visit  Assess pts adherence to chosen goals   MONITORING & EVALUATION Dietary intake, weekly physical activity, body weight, and pre-op goals   Next Steps  Patient is to return to [facility] in 1-2 weeks for next visit.

## 2022-04-13 ENCOUNTER — Other Ambulatory Visit: Payer: No Typology Code available for payment source

## 2022-04-16 ENCOUNTER — Other Ambulatory Visit: Payer: Self-pay | Admitting: General Surgery

## 2022-04-16 DIAGNOSIS — E782 Mixed hyperlipidemia: Secondary | ICD-10-CM

## 2022-04-22 ENCOUNTER — Other Ambulatory Visit: Payer: Self-pay | Admitting: Physician Assistant

## 2022-04-22 ENCOUNTER — Ambulatory Visit
Admission: RE | Admit: 2022-04-22 | Discharge: 2022-04-22 | Disposition: A | Discharge status: Home / Self Care | Payer: No Typology Code available for payment source | Source: Ambulatory Visit | Attending: Physician Assistant | Admitting: Physician Assistant

## 2022-04-22 DIAGNOSIS — N6310 Unspecified lump in the right breast, unspecified quadrant: Secondary | ICD-10-CM

## 2022-04-22 DIAGNOSIS — N6324 Unspecified lump in the left breast, lower inner quadrant: Secondary | ICD-10-CM

## 2022-04-22 DIAGNOSIS — N644 Mastodynia: Secondary | ICD-10-CM | POA: Diagnosis not present

## 2022-04-23 ENCOUNTER — Ambulatory Visit: Payer: No Typology Code available for payment source | Admitting: Dietician

## 2022-04-27 ENCOUNTER — Encounter: Payer: No Typology Code available for payment source | Attending: General Surgery | Admitting: Dietician

## 2022-04-27 ENCOUNTER — Encounter: Payer: Self-pay | Admitting: Dietician

## 2022-04-27 DIAGNOSIS — E669 Obesity, unspecified: Secondary | ICD-10-CM | POA: Insufficient documentation

## 2022-04-27 NOTE — Progress Notes (Signed)
Supervised Weight Loss Visit Bariatric Nutrition Education  NUTRITION ASSESSMENT  11 of 12 SWL  Planned surgery: sleeve gastrectomy   Anthropometrics  Start weight at [facility]: 257.3 lbs (date: 11/12/2021)  Height: 59 in Weight today: 244.3 lbs BMI:  49.34 kg/m   Clinical  Medical hx: depression, Gestational DM Medications: prenatal vitamin Labs: none update in EMR Notable signs/symptoms: migraines: sates she believes them to be work related stating she has them every day Any previous deficiencies? No  Lifestyle & Dietary Hx  Pt states her husband has been doing most of the cooking, stating they have been eating a lot of fish lately. [place]  Pt states she cut back on grazing, stating she did not eat much in between meals. Also, she states the children had cheese-its and states she does not like them. Pt states her sugar cravings are coming back, stating she was on her cycle, which may have a lot to do with it. Pt states she puts her water outside the room when eating. Pt agreeable to eating an orange rather than drinking a glass of orange juice.  Estimated daily fluid intake: 126 oz Supplements: prenatal vitamin Current average weekly physical activity: ADLs; gym for a week (3 days)  24-Hr Dietary Recal wakes around 9 am First Meal 11-12: Protein shake Snack 11am: yogurt Second Meal 3pm: humus and tomatoes and cucumbers Snack 5pm: nuts Third Meal: yam + pumpkin + dumplings + baked fish + back choy + toamto and onion or Jamaican soup and fish Snack:  Beverages: water, orange juice  Estimated Energy Needs Calories: 1500  NUTRITION DIAGNOSIS  Overweight/obesity (-3.3) related to past poor dietary habits and physical inactivity as evidenced by patient w/ planned sleeve gastrectomy surgery following dietary guidelines for continued weight loss.   NUTRITION INTERVENTION  Nutrition counseling (C-1) and education (E-2) to facilitate bariatric surgery goals.  Why you  need complex carbohydrates: Whole grains and other complex carbohydrates are required to have a healthy diet. Whole grains provide fiber which can help with blood glucose levels and help keep you satiated. Fruits and starchy vegetables provide essential vitamins and minerals required for immune function, eyesight support, brain support, bone density, wound healing and many other functions within the body. According to the current evidenced based 2020-2025 Dietary Guidelines for Americans, complex carbohydrates are part of a healthy eating pattern which is associated with a decreased risk for type 2 diabetes, cancers, and cardiovascular disease.    Pre-Op Goals Progress & New Goals Continue: Stop at fullness by putting smaller portions on your plate Continue: getting consistent with changes already made Continue: chew each bite well Continue: fill out the mindful meals sheet Continue: keep walking after dinner. continue:  Saying "I'm full" out loud (like Sister does) Continue: focus on those 3 meals for dinner on rotation until next visit, to get a variety of foods. Continue: if drinking smoothies, pair with a protein food Continue: do not drink 15 minutes before meal; during meal; or 30 minutes after meal. Continue: meal prep several dinner meals to have on hand (freezer) as needed. Continue: increase physical activity. Continue: avoid grazing  New: eat an orange instead of drinking a glass of orange juice  Handouts Provided Include    Learning Style & Readiness for Change Teaching method utilized: Visual & Auditory  Demonstrated degree of understanding via: Teach Back  Readiness Level: preparation Barriers to learning/adherence to lifestyle change: none identified   RD's Notes for next Visit  Assess pts adherence to chosen  goals   MONITORING & EVALUATION Dietary intake, weekly physical activity, body weight, and pre-op goals   Next Steps  Patient is to return to [facility] in 2 weeks  for next visit.

## 2022-04-30 ENCOUNTER — Ambulatory Visit: Payer: No Typology Code available for payment source | Admitting: Dietician

## 2022-05-04 ENCOUNTER — Encounter: Payer: Self-pay | Admitting: Dietician

## 2022-05-04 ENCOUNTER — Encounter: Payer: No Typology Code available for payment source | Attending: General Surgery | Admitting: Dietician

## 2022-05-04 DIAGNOSIS — E669 Obesity, unspecified: Secondary | ICD-10-CM | POA: Diagnosis not present

## 2022-05-04 NOTE — Progress Notes (Signed)
Supervised Weight Loss Visit Bariatric Nutrition Education  NUTRITION ASSESSMENT  12 of 12 SWL  Pt completed visits.   Pt has cleared nutrition requirements.   Planned surgery: sleeve gastrectomy   Anthropometrics  Start weight at [facility]: 257.3 lbs (date: 11/12/2021)  Height: 59 in Weight today: 245.3 lbs BMI:  49.54 kg/m   Clinical  Medical hx: depression, Gestational DM Medications: prenatal vitamin Labs: none update in EMR Notable signs/symptoms: migraines: sates she believes them to be work related stating she has them every day Any previous deficiencies? No  Lifestyle & Dietary Hx  Pt states her MRI on her pituitary gland is next week, stating when she was pregnant it was enlarged and the surgeon wants to make sure everything is okay, with no reason for the surgery to be denied. Pt states she added magnesium supplement, to help with stress. Pt states she has done very well not eating chocolate, stating her biggest fear was her chocolate cravings Pt states that has opened the door of better control over other things as well. Pt states meal prepping is something that she feels she needs to focus on after surgery. Pt states after surgery, she will create more time for herself, and focus less on work.  Estimated daily fluid intake: 126 oz Supplements: prenatal vitamin; magnesium Current average weekly physical activity: ADLs  24-Hr Dietary Recal wakes around 9 am First Meal 11-12: Protein shake Snack 11am: yogurt Second Meal 3pm: humus and tomatoes and cucumbers Snack 5pm: nuts Third Meal: yam + pumpkin + dumplings + baked fish + back choy + toamto and onion or Jamaican soup and fish Snack:  Beverages: water, orange juice  Estimated Energy Needs Calories: 1500  NUTRITION DIAGNOSIS  Overweight/obesity ([place]-3.3) related to past poor dietary habits and physical inactivity as evidenced by patient w/ planned sleeve gastrectomy surgery following dietary guidelines for  continued weight loss.   NUTRITION INTERVENTION  Nutrition counseling (C-1) and education (E-2) to facilitate bariatric surgery goals.  Pre-Op Goals Progress & New Goals Continue: Stop at fullness by putting smaller portions on your plate Continue: getting consistent with changes already made Continue: chew each bite well Continue: fill out the mindful meals sheet Continue: keep walking after dinner. continue:  Saying "I'm full" out loud (like Sister does) Continue: focus on those 3 meals for dinner on rotation until next visit, to get a variety of foods. Continue: if drinking smoothies, pair with a protein food Continue: do not drink 15 minutes before meal; during meal; or 30 minutes after meal. Continue: meal prep several dinner meals to have on hand (freezer) as needed. Continue: increase physical activity. Continue: avoid grazing   Handouts Provided Include    Learning Style & Readiness for Change Teaching method utilized: Visual & Auditory  Demonstrated degree of understanding via: Teach Back  Readiness Level: preparation Barriers to learning/adherence to lifestyle change: none identified   RD's Notes for next Visit  Assess pts adherence to chosen goals   MONITORING & EVALUATION Dietary intake, weekly physical activity, body weight, and pre-op goals   Next Steps  Pt has completed visits. No further supervised visits required/recomended  Patient is to follow up at [facility] for Pre-Op Class >2 weeks before surgery for further nutrition education.

## 2022-05-12 ENCOUNTER — Ambulatory Visit
Admission: RE | Admit: 2022-05-12 | Discharge: 2022-05-12 | Disposition: A | Discharge status: Home / Self Care | Payer: No Typology Code available for payment source | Source: Ambulatory Visit | Attending: General Surgery | Admitting: General Surgery

## 2022-05-12 DIAGNOSIS — R519 Headache, unspecified: Secondary | ICD-10-CM | POA: Diagnosis not present

## 2022-05-12 DIAGNOSIS — E782 Mixed hyperlipidemia: Secondary | ICD-10-CM | POA: Diagnosis not present

## 2022-05-12 MED ORDER — GADOPICLENOL 0.5 MMOL/ML IV SOLN
10.0000 mL | Freq: Once | INTRAVENOUS | Status: AC | PRN
  Administered 2022-05-12: 10 mL via INTRAVENOUS

## 2022-06-02 ENCOUNTER — Ambulatory Visit: Payer: No Typology Code available for payment source | Admitting: Advanced Practice Midwife

## 2022-07-02 ENCOUNTER — Other Ambulatory Visit: Payer: Self-pay | Admitting: Physician Assistant

## 2022-07-02 DIAGNOSIS — R1011 Right upper quadrant pain: Secondary | ICD-10-CM

## 2022-07-20 ENCOUNTER — Encounter: Payer: Self-pay | Admitting: Skilled Nursing Facility1

## 2022-07-20 ENCOUNTER — Encounter: Payer: No Typology Code available for payment source | Attending: General Surgery | Admitting: Skilled Nursing Facility1

## 2022-07-20 VITALS — Wt 250.8 lb

## 2022-07-20 DIAGNOSIS — E669 Obesity, unspecified: Secondary | ICD-10-CM | POA: Diagnosis present

## 2022-07-20 NOTE — Progress Notes (Unsigned)
Pre-Operative Nutrition Class:    Patient was seen on 07/20/2021 for Pre-Operative Bariatric Surgery [facility]    Surgery date: 08/04/2021 Surgery type: sleeve Start weight at [facility]: 257.3 Weight today: 250.8 pounds  Samples given per MNT protocol. Patient educated on appropriate usage: Procare Multivitamin Lot # [id number] Exp: 09/26 Procare Calcium  Lot #[id number] Exp:08/17/23   The following the learning objectives were met by the patient during this course: Identify Pre-Op Dietary Goals and will begin 2 weeks pre-operatively Identify appropriate sources of fluids and proteins  State protein recommendations and appropriate sources pre and post-operatively Identify Post-Operative Dietary Goals and will follow for 2 weeks post-operatively Identify appropriate multivitamin and calcium sources Describe the need for physical activity post-operatively and will follow MD recommendations State when to call healthcare provider regarding medication questions or post-operative complications When having a diagnosis of diabetes understanding hypoglycemia symptoms and the inclusion of 1 complex carbohydrate per meal  Handouts given during class include: Pre-Op Bariatric Surgery Diet Handout Protein Shake Handout Post-Op Bariatric Surgery Nutrition Handout BELT Program Information Flyer Support Group Information Flyer [facility] Bariatric Supplements Price List  Follow-Up Plan: Patient will follow-up at [facility] 2 weeks post operatively for diet advancement per MD.

## 2022-07-21 NOTE — Progress Notes (Addendum)
COVID Vaccine received:  [x]  No []  Yes Date of any COVID positive Test in last 90 days:  None  PCP - T[facility]M at [facility]  [phone] Cardiologist - none Neurology/ Sleep Med - Huston Foley, MD  Chest x-ray - 01-02-2019  Epic EKG -  01-02-2019  Epic Stress Test -  ECHO -  Cardiac Cath -   PCR screen: []  Ordered & Completed                      []   No Order but Needs PROFEND                      [x]   N/A for this surgery  Surgery Plan:  []  Ambulatory                            []  Outpatient in bed                            [x]  Admit  Anesthesia:    [x]  General  []  Spinal                           []   Choice []   MAC  Bowel Prep - []  No  [x]   Yes     ___as per Bariatrics, Patient has been given diet instruction_  Pacemaker / ICD device [x]  No []  Yes        Device order form faxed [x]  No    []   Yes      Faxed to:  Spinal Cord Stimulator:[x]  No []  Yes      (Remind patient to bring remote DOS) Other Implants:   History of Sleep Apnea? []  No [x]  Yes   CPAP used?- [x]  No []  Yes   Did not get a CPAP, it was too expensive  PRE-DM  diet only Does the patient monitor blood sugar? [x]  No []  Yes  []  N/A  ERAS Protocol Ordered: []  No  [x]  Yes PRE-SURGERY []  ENSURE  [x]  G2  Patient is to be NPO after: 04:30  Comments: Patient is JEHOVAH'S WITNESS,  NO BLOOD PRODUCTS.  She signed the Blood Refusal form to allow Albumin but she may change her mind at DOS to No Blood products.   Activity level: Patient can climb a flight of stairs without difficulty; [x]  No CP  [x]  No SOB. Patient can perform ADLs without assistance.   Anesthesia review: Anemia, Pre-DM, Palps, tachycardia, mild OSA/CPAP, GERD, anxiety  Patient denies shortness of breath, fever, cough and chest pain at PAT appointment.  Patient verbalized understanding and agreement to the Pre-Surgical Instructions that were given to them at this PAT appointment. Patient was also educated of the need to review these PAT  instructions again prior to his/her surgery.I reviewed the appropriate phone numbers to call if they have any and questions or concerns.

## 2022-07-22 ENCOUNTER — Encounter (HOSPITAL_COMMUNITY): Admission: RE | Admit: 2022-07-22 | Payer: No Typology Code available for payment source | Source: Ambulatory Visit

## 2022-07-22 ENCOUNTER — Ambulatory Visit: Payer: Self-pay | Admitting: General Surgery

## 2022-07-23 ENCOUNTER — Encounter (HOSPITAL_COMMUNITY): Payer: Self-pay

## 2022-07-23 ENCOUNTER — Other Ambulatory Visit: Payer: Self-pay

## 2022-07-23 ENCOUNTER — Encounter (HOSPITAL_COMMUNITY)
Admission: RE | Admit: 2022-07-23 | Discharge: 2022-07-23 | Disposition: A | Discharge status: Home / Self Care | Payer: No Typology Code available for payment source | Source: Ambulatory Visit | Attending: General Surgery | Admitting: General Surgery

## 2022-07-23 VITALS — BP 156/83 | HR 84 | Temp 98.3°F | Resp 22 | Ht 59.0 in | Wt 246.0 lb

## 2022-07-23 DIAGNOSIS — I10 Essential (primary) hypertension: Secondary | ICD-10-CM

## 2022-07-23 DIAGNOSIS — R7303 Prediabetes: Secondary | ICD-10-CM | POA: Diagnosis not present

## 2022-07-23 DIAGNOSIS — Z01818 Encounter for other preprocedural examination: Secondary | ICD-10-CM

## 2022-07-23 DIAGNOSIS — Z6841 Body Mass Index (BMI) 40.0 and over, adult: Secondary | ICD-10-CM | POA: Insufficient documentation

## 2022-07-23 HISTORY — DX: Prediabetes: R73.03

## 2022-07-23 HISTORY — DX: Cardiac arrhythmia, unspecified: I49.9

## 2022-07-23 HISTORY — DX: Anxiety disorder, unspecified: F41.9

## 2022-07-23 LAB — CBC WITH DIFFERENTIAL/PLATELET
Abs Immature Granulocytes: 0.02 10*3/uL (ref 0.00–0.07)
Basophils Absolute: 0 10*3/uL (ref 0.0–0.1)
Basophils Relative: 1 %
Eosinophils Absolute: 0.1 10*3/uL (ref 0.0–0.5)
Eosinophils Relative: 2 %
HCT: 35.6 % — ABNORMAL LOW (ref 36.0–46.0)
Hemoglobin: 11.9 g/dL — ABNORMAL LOW (ref 12.0–15.0)
Immature Granulocytes: 0 %
Lymphocytes Relative: 34 %
Lymphs Abs: 2 10*3/uL (ref 0.7–4.0)
MCH: 29.9 pg (ref 26.0–34.0)
MCHC: 33.4 g/dL (ref 30.0–36.0)
MCV: 89.4 fL (ref 80.0–100.0)
Monocytes Absolute: 0.4 10*3/uL (ref 0.1–1.0)
Monocytes Relative: 7 %
Neutro Abs: 3.4 10*3/uL (ref 1.7–7.7)
Neutrophils Relative %: 56 %
Platelets: 280 10*3/uL (ref 150–400)
RBC: 3.98 MIL/uL (ref 3.87–5.11)
RDW: 13.1 % (ref 11.5–15.5)
WBC: 5.9 10*3/uL (ref 4.0–10.5)
nRBC: 0 % (ref 0.0–0.2)

## 2022-07-23 LAB — GLUCOSE, CAPILLARY: Glucose-Capillary: 92 mg/dL (ref 70–99)

## 2022-07-23 LAB — COMPREHENSIVE METABOLIC PANEL
ALT: 23 U/L (ref 0–44)
AST: 17 U/L (ref 15–41)
Albumin: 3.7 g/dL (ref 3.5–5.0)
Alkaline Phosphatase: 40 U/L (ref 38–126)
Anion gap: 8 (ref 5–15)
BUN: 15 mg/dL (ref 6–20)
CO2: 23 mmol/L (ref 22–32)
Calcium: 8.9 mg/dL (ref 8.9–10.3)
Chloride: 104 mmol/L (ref 98–111)
Creatinine, Ser: 0.71 mg/dL (ref 0.44–1.00)
GFR, Estimated: >60 mL/min (ref >60)
Glucose, Bld: 106 mg/dL — ABNORMAL HIGH (ref 70–99)
Potassium: 3.6 mmol/L (ref 3.5–5.1)
Sodium: 135 mmol/L (ref 135–145)
Total Bilirubin: 0.7 mg/dL (ref 0.3–1.2)
Total Protein: 7.6 g/dL (ref 6.5–8.1)

## 2022-07-23 LAB — HEMOGLOBIN A1C
Hgb A1c MFr Bld: 5.3 % (ref 4.8–5.6)
Mean Plasma Glucose: 105.41 mg/dL

## 2022-07-23 NOTE — Patient Instructions (Addendum)
SURGICAL WAITING ROOM VISITATION Patients having surgery or a procedure may have no more than 2 support people in the waiting area - these visitors may rotate in the visitor waiting room.   Due to an increase in RSV and influenza rates and associated hospitalizations, children ages 42 and under may not visit patients in [facility] hospitals. If the patient needs to stay at the hospital during part of their recovery, the visitor guidelines for inpatient rooms apply.  PRE-OP VISITATION  Pre-op nurse will coordinate an appropriate time for 1 support person to accompany the patient in pre-op.  This support person may not rotate.  This visitor will be contacted when the time is appropriate for the visitor to come back in the pre-op area.  Please refer to the [facility] website for the visitor guidelines for Inpatients (after your surgery is over and you are in a regular room).  You are not required to quarantine at this time prior to your surgery. However, you must do this: Hand Hygiene often Do NOT share personal items Notify your provider if you are in close contact with someone who has COVID or you develop fever 100.4 or greater, new onset of sneezing, cough, sore throat, shortness of breath or body aches.  If you test positive for Covid or have been in contact with anyone that has tested positive in the last 10 days please notify you surgeon.    Your procedure is scheduled on:  Tuesday   August 04, 2022  Report to [facility] Main Entrance: [place] entrance where the [employer] is available.   Report to admitting at: 05:15    AM  +++++Call this number if you have any questions or problems the morning of surgery [phone]  Do not eat food after Midnight the night prior to your surgery/procedure.  After Midnight you may have the following liquids until  0430  AM DAY OF SURGERY  Clear Liquid Diet Water Black Coffee (sugar ok, NO MILK/CREAM OR CREAMERS)  Tea (sugar ok,  NO MILK/CREAM OR CREAMERS) regular and decaf                             Plain Jell-O  with no fruit (NO RED)                                           Fruit ices (not with fruit pulp, NO RED)                                     Popsicles (NO RED)                                                                  Juice: apple, WHITE grape, WHITE cranberry Sports drinks like Gatorade or Powerade (NO RED)                    The day of surgery:  Drink ONE (1) Pre-Surgery  G2 at  04:30   AM the morning of surgery.  Drink in one sitting. Do not sip.  This drink was given to you during your hospital pre-op appointment visit. Nothing else to drink after completing the Pre-Surgery G2 : No candy, chewing gum or throat lozenges.    FOLLOW BOWEL PREP AND ANY ADDITIONAL PRE OP INSTRUCTIONS YOU RECEIVED FROM YOUR SURGEON'S OFFICE!!!   Oral Hygiene is also important to reduce your risk of infection.        Remember - BRUSH YOUR TEETH THE MORNING OF SURGERY WITH YOUR REGULAR TOOTHPASTE  Take ONLY these medicines the morning of surgery with A SIP OF WATER: none                  You may not have any metal on your body including hair pins, jewelry, and body piercing  Do not wear make-up, lotions, powders, perfumes or deodorant  Do not wear nail polish including gel and S&S, artificial / acrylic nails, or any other type of covering on natural nails including finger and toenails. If you have artificial nails, gel coating, etc., that needs to be removed by a nail salon, Please have this removed prior to surgery. Not doing so may mean that your surgery could be cancelled or delayed if the Surgeon or anesthesia staff feels like they are unable to monitor you safely.   Do not shave 48 hours prior to surgery to avoid nicks in your skin which may contribute to postoperative infections.   Contacts, Hearing Aids, dentures or bridgework may not be worn into surgery. DENTURES WILL BE REMOVED PRIOR TO SURGERY PLEASE  DO NOT APPLY "Poly grip" OR ADHESIVES!!!  You may bring a small overnight bag with you on the day of surgery, only pack items that are not valuable. [facility] IS NOT RESPONSIBLE   FOR VALUABLES THAT ARE LOST OR STOLEN.   Do not bring your home medications to the hospital. The Pharmacy will dispense medications listed on your medication list to you during your admission in the Hospital.   Please read over the following fact sheets you were given: IF YOU HAVE QUESTIONS ABOUT YOUR PRE-OP INSTRUCTIONS, PLEASE CALL [phone]  (KAY)   [place] - Preparing for Surgery Before surgery, you can play an important role.  Because skin is not sterile, your skin needs to be as free of germs as possible.  You can reduce the number of germs on your skin by washing with CHG (chlorahexidine gluconate) soap before surgery.  CHG is an antiseptic cleaner which kills germs and bonds with the skin to continue killing germs even after washing. Please DO NOT use if you have an allergy to CHG or antibacterial soaps.  If your skin becomes reddened/irritated stop using the CHG and inform your nurse when you arrive at Short Stay. Do not shave (including legs and underarms) for at least 48 hours prior to the first CHG shower.  You may shave your face/neck.  Please follow these instructions carefully:  1.  Shower with CHG Soap the night before surgery and the  morning of surgery.  2.  If you choose to wash your hair, wash your hair first as usual with your normal  shampoo.  3.  After you shampoo, rinse your hair and body thoroughly to remove the shampoo.                             4.  Use CHG as you would any other liquid soap.  You  can apply chg directly to the skin and wash.  Gently with a scrungie or clean washcloth.  5.  Apply the CHG Soap to your body ONLY FROM THE NECK DOWN.   Do not use on face/ open                           Wound or open sores. Avoid contact with eyes, ears mouth and genitals (private parts).                        Wash face,  Genitals (private parts) with your normal soap.             6.  Wash thoroughly, paying special attention to the area where your  surgery  will be performed.  7.  Thoroughly rinse your body with warm water from the neck down.  8.  DO NOT shower/wash with your normal soap after using and rinsing off the CHG Soap.            9.  Pat yourself dry with a clean towel.            10.  Wear clean pajamas.            11.  Place clean sheets on your bed the night of your first shower and do not  sleep with pets.  ON THE DAY OF SURGERY : Do not apply any lotions/deodorants the morning of surgery.  Please wear clean clothes to the hospital/surgery center.    FAILURE TO FOLLOW THESE INSTRUCTIONS MAY RESULT IN THE CANCELLATION OF YOUR SURGERY  PATIENT SIGNATURE_________________________________  NURSE SIGNATURE__________________________________  ________________________________________________________________________       Adam Phenix    An incentive spirometer is a tool that can help keep your lungs clear and active. This tool measures how well you are filling your lungs with each breath. Taking long deep breaths may help reverse or decrease the chance of developing breathing (pulmonary) problems (especially infection) following: A long period of time when you are unable to move or be active. BEFORE THE PROCEDURE  If the spirometer includes an indicator to show your best effort, your nurse or respiratory therapist will set it to a desired goal. If possible, sit up straight or lean slightly forward. Try not to slouch. Hold the incentive spirometer in an upright position. INSTRUCTIONS FOR USE  Sit on the edge of your bed if possible, or sit up as far as you can in bed or on a chair. Hold the incentive spirometer in an upright position. Breathe out normally. Place the mouthpiece in your mouth and seal your lips tightly around it. Breathe in slowly  and as deeply as possible, raising the piston or the ball toward the top of the column. Hold your breath for 3-5 seconds or for as long as possible. Allow the piston or ball to fall to the bottom of the column. Remove the mouthpiece from your mouth and breathe out normally. Rest for a few seconds and repeat Steps 1 through 7 at least 10 times every 1-2 hours when you are awake. Take your time and take a few normal breaths between deep breaths. The spirometer may include an indicator to show your best effort. Use the indicator as a goal to work toward during each repetition. After each set of 10 deep breaths, practice coughing to be sure your lungs are clear. If you have an incision (the cut made  at the time of surgery), support your incision when coughing by placing a pillow or rolled up towels firmly against it. Once you are able to get out of bed, walk around indoors and cough well. You may stop using the incentive spirometer when instructed by your caregiver.  RISKS AND COMPLICATIONS Take your time so you do not get dizzy or light-headed. If you are in pain, you may need to take or ask for pain medication before doing incentive spirometry. It is harder to take a deep breath if you are having pain. AFTER USE Rest and breathe slowly and easily. It can be helpful to keep track of a log of your progress. Your caregiver can provide you with a simple table to help with this. If you are using the spirometer at home, follow these instructions: [place] IF:  You are having difficultly using the spirometer. You have trouble using the spirometer as often as instructed. Your pain medication is not giving enough relief while using the spirometer. You develop fever of 100.5 F (38.1 C) or higher.                                                                                                    SEEK IMMEDIATE MEDICAL CARE IF:  You cough up bloody sputum that had not been present before. You  develop fever of 102 F (38.9 C) or greater. You develop worsening pain at or near the incision site. MAKE SURE YOU:  Understand these instructions. Will watch your condition. Will get help right away if you are not doing well or get worse. Document Released: 11/09/2006 Document Revised: 09/21/2011 Document Reviewed: 01/10/2007 [facility] Patient Information 2014 [place], [place].

## 2022-08-03 NOTE — Anesthesia Preprocedure Evaluation (Addendum)
Anesthesia Evaluation  Patient identified by MRN, date of birth, ID band Patient awake    Reviewed: Allergy & Precautions, NPO status , Patient's Chart, lab work & pertinent test results  Airway Mallampati: II  TM Distance: >3 FB Neck ROM: Full    Dental no notable dental hx. (+) Dental Advisory Given   Pulmonary sleep apnea and Continuous Positive Airway Pressure Ventilation    Pulmonary exam normal        Cardiovascular negative cardio ROS Normal cardiovascular exam     Neuro/Psych  PSYCHIATRIC DISORDERS Anxiety Depression    negative neurological ROS     GI/Hepatic negative GI ROS, Neg liver ROS,,,  Endo/Other    Morbid obesity  Renal/GU negative Renal ROS     Musculoskeletal negative musculoskeletal ROS (+)    Abdominal   Peds  Hematology  (+) Blood dyscrasia, anemia   Anesthesia Other Findings   Reproductive/Obstetrics                             Anesthesia Physical Anesthesia Plan  ASA: 3  Anesthesia Plan: General   Post-op Pain Management: Tylenol PO (pre-op)* and Ketamine IV*   Induction: Intravenous  PONV Risk Score and Plan: 4 or greater and Ondansetron, Aprepitant, Dexamethasone, Midazolam and Scopolamine patch - Pre-op  Airway Management Planned: Oral ETT  Additional Equipment:   Intra-op Plan:   Post-operative Plan: Extubation in OR  Informed Consent: I have reviewed the patients History and Physical, chart, labs and discussed the procedure including the risks, benefits and alternatives for the proposed anesthesia with the patient or authorized representative who has indicated his/her understanding and acceptance.     Dental advisory given  Plan Discussed with: Anesthesiologist and CRNA  Anesthesia Plan Comments:        Anesthesia Quick Evaluation

## 2022-08-04 ENCOUNTER — Inpatient Hospital Stay (HOSPITAL_COMMUNITY): Payer: No Typology Code available for payment source | Admitting: Certified Registered"

## 2022-08-04 ENCOUNTER — Encounter (HOSPITAL_COMMUNITY): Payer: Self-pay | Admitting: General Surgery

## 2022-08-04 ENCOUNTER — Other Ambulatory Visit: Payer: Self-pay

## 2022-08-04 ENCOUNTER — Inpatient Hospital Stay (HOSPITAL_COMMUNITY)
Admission: RE | Admit: 2022-08-04 | Discharge: 2022-08-06 | DRG: 621 | Disposition: A | Discharge status: Home / Self Care | Payer: No Typology Code available for payment source | Attending: General Surgery | Admitting: General Surgery

## 2022-08-04 ENCOUNTER — Encounter (HOSPITAL_COMMUNITY)
Admission: RE | Disposition: A | Discharge status: Home / Self Care | Payer: Self-pay | Source: Home / Self Care | Attending: General Surgery

## 2022-08-04 DIAGNOSIS — M545 Low back pain, unspecified: Secondary | ICD-10-CM | POA: Diagnosis present

## 2022-08-04 DIAGNOSIS — E782 Mixed hyperlipidemia: Secondary | ICD-10-CM | POA: Diagnosis present

## 2022-08-04 DIAGNOSIS — G8929 Other chronic pain: Secondary | ICD-10-CM | POA: Diagnosis present

## 2022-08-04 DIAGNOSIS — F418 Other specified anxiety disorders: Secondary | ICD-10-CM | POA: Diagnosis not present

## 2022-08-04 DIAGNOSIS — G4733 Obstructive sleep apnea (adult) (pediatric): Secondary | ICD-10-CM | POA: Diagnosis present

## 2022-08-04 DIAGNOSIS — Z833 Family history of diabetes mellitus: Secondary | ICD-10-CM | POA: Diagnosis not present

## 2022-08-04 DIAGNOSIS — R519 Headache, unspecified: Secondary | ICD-10-CM | POA: Diagnosis present

## 2022-08-04 DIAGNOSIS — Z01818 Encounter for other preprocedural examination: Secondary | ICD-10-CM

## 2022-08-04 DIAGNOSIS — Z91040 Latex allergy status: Secondary | ICD-10-CM | POA: Diagnosis not present

## 2022-08-04 DIAGNOSIS — Z8249 Family history of ischemic heart disease and other diseases of the circulatory system: Secondary | ICD-10-CM | POA: Diagnosis not present

## 2022-08-04 DIAGNOSIS — Z6841 Body Mass Index (BMI) 40.0 and over, adult: Secondary | ICD-10-CM

## 2022-08-04 DIAGNOSIS — G44229 Chronic tension-type headache, not intractable: Secondary | ICD-10-CM | POA: Diagnosis not present

## 2022-08-04 DIAGNOSIS — K219 Gastro-esophageal reflux disease without esophagitis: Secondary | ICD-10-CM | POA: Diagnosis present

## 2022-08-04 DIAGNOSIS — I10 Essential (primary) hypertension: Secondary | ICD-10-CM

## 2022-08-04 DIAGNOSIS — R7303 Prediabetes: Secondary | ICD-10-CM | POA: Diagnosis present

## 2022-08-04 DIAGNOSIS — Z9884 Bariatric surgery status: Secondary | ICD-10-CM | POA: Diagnosis present

## 2022-08-04 HISTORY — PX: UPPER GI ENDOSCOPY: SHX6162

## 2022-08-04 HISTORY — PX: LAPAROSCOPIC GASTRIC SLEEVE RESECTION: SHX5895

## 2022-08-04 LAB — BASIC METABOLIC PANEL
Anion gap: 12 (ref 5–15)
BUN: 13 mg/dL (ref 6–20)
CO2: 21 mmol/L — ABNORMAL LOW (ref 22–32)
Calcium: 8.1 mg/dL — ABNORMAL LOW (ref 8.9–10.3)
Chloride: 100 mmol/L (ref 98–111)
Creatinine, Ser: 0.64 mg/dL (ref 0.44–1.00)
GFR, Estimated: >60 mL/min (ref >60)
Glucose, Bld: 154 mg/dL — ABNORMAL HIGH (ref 70–99)
Potassium: 3.2 mmol/L — ABNORMAL LOW (ref 3.5–5.1)
Sodium: 133 mmol/L — ABNORMAL LOW (ref 135–145)

## 2022-08-04 LAB — CBC
HCT: 31.2 % — ABNORMAL LOW (ref 36.0–46.0)
Hemoglobin: 10.4 g/dL — ABNORMAL LOW (ref 12.0–15.0)
MCH: 30.1 pg (ref 26.0–34.0)
MCHC: 33.3 g/dL (ref 30.0–36.0)
MCV: 90.4 fL (ref 80.0–100.0)
Platelets: 268 10*3/uL (ref 150–400)
RBC: 3.45 MIL/uL — ABNORMAL LOW (ref 3.87–5.11)
RDW: 12.8 % (ref 11.5–15.5)
WBC: 9.6 10*3/uL (ref 4.0–10.5)
nRBC: 0 % (ref 0.0–0.2)

## 2022-08-04 LAB — GLUCOSE, CAPILLARY: Glucose-Capillary: 106 mg/dL — ABNORMAL HIGH (ref 70–99)

## 2022-08-04 LAB — POCT PREGNANCY, URINE: Preg Test, Ur: NEGATIVE

## 2022-08-04 LAB — NO BLOOD PRODUCTS

## 2022-08-04 SURGERY — GASTRECTOMY, SLEEVE, LAPAROSCOPIC
Anesthesia: General

## 2022-08-04 MED ORDER — BUPIVACAINE LIPOSOME 1.3 % IJ SUSP: 20.0000 mL | Freq: Once | INTRAMUSCULAR | Status: DC

## 2022-08-04 MED ORDER — SCOPOLAMINE 1 MG/3DAYS TD PT72
1.0000 | MEDICATED_PATCH | TRANSDERMAL | Status: DC
  Administered 2022-08-04: 1.5 mg via TRANSDERMAL
  Filled 2022-08-04: qty 1

## 2022-08-04 MED ORDER — ORAL CARE MOUTH RINSE: 15.0000 mL | Freq: Once | OROMUCOSAL | Status: AC

## 2022-08-04 MED ORDER — SUGAMMADEX SODIUM 200 MG/2ML IV SOLN
INTRAVENOUS | Status: DC | PRN
  Administered 2022-08-04: 250 mg via INTRAVENOUS

## 2022-08-04 MED ORDER — ROCURONIUM BROMIDE 10 MG/ML (PF) SYRINGE
PREFILLED_SYRINGE | INTRAVENOUS | Status: DC | PRN
  Administered 2022-08-04: 100 mg via INTRAVENOUS

## 2022-08-04 MED ORDER — DEXAMETHASONE SODIUM PHOSPHATE 10 MG/ML IJ SOLN
INTRAMUSCULAR | Status: AC
  Filled 2022-08-04: qty 1

## 2022-08-04 MED ORDER — ONDANSETRON HCL 4 MG/2ML IJ SOLN
INTRAMUSCULAR | Status: DC | PRN
  Administered 2022-08-04: 4 mg via INTRAVENOUS

## 2022-08-04 MED ORDER — HYDROMORPHONE HCL 1 MG/ML IJ SOLN
0.2500 mg | INTRAMUSCULAR | Status: DC | PRN
  Administered 2022-08-04 (×2): 0.25 mg via INTRAVENOUS

## 2022-08-04 MED ORDER — DEXAMETHASONE SODIUM PHOSPHATE 4 MG/ML IJ SOLN: 4.0000 mg | INTRAMUSCULAR | Status: DC

## 2022-08-04 MED ORDER — ONDANSETRON HCL 4 MG/2ML IJ SOLN
4.0000 mg | Freq: Four times a day (QID) | INTRAMUSCULAR | Status: DC | PRN
  Administered 2022-08-04 – 2022-08-05 (×2): 4 mg via INTRAVENOUS
  Filled 2022-08-04 (×2): qty 2

## 2022-08-04 MED ORDER — HYDROMORPHONE HCL 1 MG/ML IJ SOLN
INTRAMUSCULAR | Status: AC
  Filled 2022-08-04: qty 1

## 2022-08-04 MED ORDER — FENTANYL CITRATE PF 50 MCG/ML IJ SOSY
PREFILLED_SYRINGE | INTRAMUSCULAR | Status: AC
  Filled 2022-08-04: qty 3

## 2022-08-04 MED ORDER — ONDANSETRON HCL 4 MG/2ML IJ SOLN
INTRAMUSCULAR | Status: AC
  Filled 2022-08-04: qty 2

## 2022-08-04 MED ORDER — ACETAMINOPHEN 500 MG PO TABS: 1000.0000 mg | ORAL_TABLET | ORAL | Status: DC

## 2022-08-04 MED ORDER — FENTANYL CITRATE (PF) 100 MCG/2ML IJ SOLN
INTRAMUSCULAR | Status: DC | PRN
  Administered 2022-08-04: 100 ug via INTRAVENOUS

## 2022-08-04 MED ORDER — CHLORHEXIDINE GLUCONATE 4 % EX LIQD: Freq: Once | CUTANEOUS | Status: DC

## 2022-08-04 MED ORDER — SCOPOLAMINE 1 MG/3DAYS TD PT72: 1.0000 | MEDICATED_PATCH | TRANSDERMAL | Status: DC

## 2022-08-04 MED ORDER — OXYCODONE HCL 5 MG/5ML PO SOLN
5.0000 mg | Freq: Four times a day (QID) | ORAL | Status: DC | PRN
  Administered 2022-08-04 (×2): 5 mg via ORAL
  Filled 2022-08-04 (×2): qty 5

## 2022-08-04 MED ORDER — BUPIVACAINE LIPOSOME 1.3 % IJ SUSP
INTRAMUSCULAR | Status: AC
  Filled 2022-08-04: qty 20

## 2022-08-04 MED ORDER — CHLORHEXIDINE GLUCONATE 0.12 % MT SOLN
15.0000 mL | Freq: Once | OROMUCOSAL | Status: AC
  Administered 2022-08-04: 15 mL via OROMUCOSAL

## 2022-08-04 MED ORDER — LACTATED RINGERS IR SOLN
Status: DC | PRN
  Administered 2022-08-04: 1000 mL

## 2022-08-04 MED ORDER — PROMETHAZINE HCL 25 MG/ML IJ SOLN: 6.2500 mg | INTRAMUSCULAR | Status: DC | PRN

## 2022-08-04 MED ORDER — ACETAMINOPHEN 500 MG PO TABS
1000.0000 mg | ORAL_TABLET | Freq: Three times a day (TID) | ORAL | Status: DC
  Administered 2022-08-04 – 2022-08-06 (×4): 1000 mg via ORAL
  Filled 2022-08-04 (×4): qty 2

## 2022-08-04 MED ORDER — FENTANYL CITRATE PF 50 MCG/ML IJ SOSY
25.0000 ug | PREFILLED_SYRINGE | INTRAMUSCULAR | Status: DC | PRN
  Administered 2022-08-04 (×3): 50 ug via INTRAVENOUS

## 2022-08-04 MED ORDER — DEXAMETHASONE SODIUM PHOSPHATE 10 MG/ML IJ SOLN
INTRAMUSCULAR | Status: DC | PRN
  Administered 2022-08-04: 8 mg via INTRAVENOUS

## 2022-08-04 MED ORDER — HEPARIN SODIUM (PORCINE) 5000 UNIT/ML IJ SOLN
5000.0000 [IU] | INTRAMUSCULAR | Status: AC
  Administered 2022-08-04: 5000 [IU] via SUBCUTANEOUS
  Filled 2022-08-04: qty 1

## 2022-08-04 MED ORDER — AMISULPRIDE (ANTIEMETIC) 5 MG/2ML IV SOLN
INTRAVENOUS | Status: AC
  Filled 2022-08-04: qty 4

## 2022-08-04 MED ORDER — PHENYLEPHRINE 80 MCG/ML (10ML) SYRINGE FOR IV PUSH (FOR BLOOD PRESSURE SUPPORT)
PREFILLED_SYRINGE | INTRAVENOUS | Status: AC
  Filled 2022-08-04: qty 10

## 2022-08-04 MED ORDER — SUGAMMADEX SODIUM 500 MG/5ML IV SOLN
INTRAVENOUS | Status: AC
  Filled 2022-08-04: qty 5

## 2022-08-04 MED ORDER — 0.9 % SODIUM CHLORIDE (POUR BTL) OPTIME
TOPICAL | Status: DC | PRN
  Administered 2022-08-04: 1000 mL

## 2022-08-04 MED ORDER — KETAMINE HCL 10 MG/ML IJ SOLN
INTRAMUSCULAR | Status: DC | PRN
  Administered 2022-08-04: 10 mg via INTRAVENOUS
  Administered 2022-08-04: 20 mg via INTRAVENOUS

## 2022-08-04 MED ORDER — MIDAZOLAM HCL 2 MG/2ML IJ SOLN
INTRAMUSCULAR | Status: DC | PRN
  Administered 2022-08-04: 2 mg via INTRAVENOUS

## 2022-08-04 MED ORDER — PHENYLEPHRINE 80 MCG/ML (10ML) SYRINGE FOR IV PUSH (FOR BLOOD PRESSURE SUPPORT)
PREFILLED_SYRINGE | INTRAVENOUS | Status: DC | PRN
  Administered 2022-08-04 (×5): 80 ug via INTRAVENOUS

## 2022-08-04 MED ORDER — FENTANYL CITRATE (PF) 100 MCG/2ML IJ SOLN
INTRAMUSCULAR | Status: AC
  Filled 2022-08-04: qty 2

## 2022-08-04 MED ORDER — PROPOFOL 10 MG/ML IV BOLUS
INTRAVENOUS | Status: AC
  Filled 2022-08-04: qty 20

## 2022-08-04 MED ORDER — LIDOCAINE 2% (20 MG/ML) 5 ML SYRINGE
INTRAMUSCULAR | Status: DC | PRN
  Administered 2022-08-04: 60 mg via INTRAVENOUS

## 2022-08-04 MED ORDER — AMISULPRIDE (ANTIEMETIC) 5 MG/2ML IV SOLN
10.0000 mg | Freq: Once | INTRAVENOUS | Status: AC | PRN
  Administered 2022-08-04: 10 mg via INTRAVENOUS

## 2022-08-04 MED ORDER — KETAMINE HCL 50 MG/5ML IJ SOSY
PREFILLED_SYRINGE | INTRAMUSCULAR | Status: AC
  Filled 2022-08-04: qty 5

## 2022-08-04 MED ORDER — KCL IN DEXTROSE-NACL 20-5-0.45 MEQ/L-%-% IV SOLN
INTRAVENOUS | Status: DC
  Filled 2022-08-04 (×6): qty 1000

## 2022-08-04 MED ORDER — ROCURONIUM BROMIDE 10 MG/ML (PF) SYRINGE
PREFILLED_SYRINGE | INTRAVENOUS | Status: AC
  Filled 2022-08-04: qty 10

## 2022-08-04 MED ORDER — BUPIVACAINE-EPINEPHRINE 0.5% -1:200000 IJ SOLN
INTRAMUSCULAR | Status: DC | PRN
  Administered 2022-08-04: 30 mL

## 2022-08-04 MED ORDER — APREPITANT 40 MG PO CAPS
40.0000 mg | ORAL_CAPSULE | ORAL | Status: AC
  Administered 2022-08-04: 40 mg via ORAL
  Filled 2022-08-04: qty 1

## 2022-08-04 MED ORDER — SODIUM CHLORIDE 0.9 % IV SOLN
2.0000 g | INTRAVENOUS | Status: AC
  Administered 2022-08-04: 2 g via INTRAVENOUS
  Filled 2022-08-04: qty 2

## 2022-08-04 MED ORDER — SIMETHICONE 80 MG PO CHEW
80.0000 mg | CHEWABLE_TABLET | Freq: Four times a day (QID) | ORAL | Status: DC | PRN
  Administered 2022-08-04 – 2022-08-05 (×3): 80 mg via ORAL
  Filled 2022-08-04 (×3): qty 1

## 2022-08-04 MED ORDER — MORPHINE SULFATE (PF) 2 MG/ML IV SOLN: 1.0000 mg | INTRAVENOUS | Status: DC | PRN

## 2022-08-04 MED ORDER — HEPARIN SODIUM (PORCINE) 5000 UNIT/ML IJ SOLN
5000.0000 [IU] | Freq: Three times a day (TID) | INTRAMUSCULAR | Status: DC
  Administered 2022-08-04 – 2022-08-06 (×5): 5000 [IU] via SUBCUTANEOUS
  Filled 2022-08-04 (×5): qty 1

## 2022-08-04 MED ORDER — ACETAMINOPHEN 500 MG PO TABS
1000.0000 mg | ORAL_TABLET | Freq: Once | ORAL | Status: AC
  Administered 2022-08-04: 1000 mg via ORAL
  Filled 2022-08-04: qty 2

## 2022-08-04 MED ORDER — PANTOPRAZOLE SODIUM 40 MG IV SOLR
40.0000 mg | Freq: Every day | INTRAVENOUS | Status: DC
  Administered 2022-08-04 – 2022-08-05 (×2): 40 mg via INTRAVENOUS
  Filled 2022-08-04 (×2): qty 10

## 2022-08-04 MED ORDER — PROPOFOL 10 MG/ML IV BOLUS
INTRAVENOUS | Status: DC | PRN
  Administered 2022-08-04: 190 mg via INTRAVENOUS

## 2022-08-04 MED ORDER — ACETAMINOPHEN 160 MG/5ML PO SOLN
1000.0000 mg | Freq: Three times a day (TID) | ORAL | Status: DC
  Administered 2022-08-05: 1000 mg via ORAL
  Filled 2022-08-04: qty 40.6

## 2022-08-04 MED ORDER — MIDAZOLAM HCL 2 MG/2ML IJ SOLN
INTRAMUSCULAR | Status: AC
  Filled 2022-08-04: qty 2

## 2022-08-04 MED ORDER — BUPIVACAINE-EPINEPHRINE (PF) 0.5% -1:200000 IJ SOLN
INTRAMUSCULAR | Status: AC
  Filled 2022-08-04: qty 30

## 2022-08-04 MED ORDER — STERILE WATER FOR IRRIGATION IR SOLN
Status: DC | PRN
  Administered 2022-08-04: 1000 mL

## 2022-08-04 MED ORDER — BUPIVACAINE LIPOSOME 1.3 % IJ SUSP
INTRAMUSCULAR | Status: DC | PRN
  Administered 2022-08-04: 20 mL

## 2022-08-04 MED ORDER — LACTATED RINGERS IV SOLN: INTRAVENOUS | Status: DC

## 2022-08-04 MED ORDER — SODIUM CHLORIDE 0.9 % IV SOLN
12.5000 mg | Freq: Four times a day (QID) | INTRAVENOUS | Status: DC | PRN
  Administered 2022-08-05 – 2022-08-06 (×3): 12.5 mg via INTRAVENOUS
  Filled 2022-08-04 (×3): qty 12.5

## 2022-08-04 MED ORDER — ENSURE MAX PROTEIN PO LIQD
2.0000 [oz_av] | ORAL | Status: DC
  Administered 2022-08-05 – 2022-08-06 (×11): 2 [oz_av] via ORAL

## 2022-08-04 SURGICAL SUPPLY — 83 items
ANTIFOG SOL W/FOAM PAD STRL (MISCELLANEOUS) ×1
APL PRP STRL LF DISP 70% ISPRP (MISCELLANEOUS) ×2
APL SRG 32X5 SNPLK LF DISP (MISCELLANEOUS)
APL SWBSTK 6 STRL LF DISP (MISCELLANEOUS)
APPLICATOR COTTON TIP 6 STRL (MISCELLANEOUS) IMPLANT
APPLICATOR COTTON TIP 6IN STRL (MISCELLANEOUS)
APPLIER CLIP ROT 10 11.4 M/L (STAPLE)
APPLIER CLIP ROT 13.4 12 LRG (CLIP)
APR CLP LRG 13.4X12 ROT 20 MLT (CLIP)
APR CLP MED LRG 11.4X10 (STAPLE)
BAG COUNTER SPONGE SURGICOUNT (BAG) IMPLANT
BAG SPNG CNTER NS LX DISP (BAG)
BLADE SURG SZ11 CARB STEEL (BLADE) ×1 IMPLANT
CABLE HIGH FREQUENCY MONO STRZ (ELECTRODE) IMPLANT
CHLORAPREP W/TINT 26 (MISCELLANEOUS) ×2 IMPLANT
CLIP APPLIE ROT 10 11.4 M/L (STAPLE) IMPLANT
CLIP APPLIE ROT 13.4 12 LRG (CLIP) IMPLANT
COVER SURGICAL LIGHT HANDLE (MISCELLANEOUS) ×1 IMPLANT
DEVICE SUT QUICK LOAD TK 5 (SUTURE) IMPLANT
DEVICE SUT TI-KNOT TK 5X26 (SUTURE) IMPLANT
DEVICE SUTURE ENDOST 10MM (ENDOMECHANICALS) IMPLANT
DISSECTOR BLUNT TIP ENDO 5MM (MISCELLANEOUS) IMPLANT
DRAPE UTILITY XL STRL (DRAPES) ×2 IMPLANT
DRSG TEGADERM 2-3/8X2-3/4 SM (GAUZE/BANDAGES/DRESSINGS) ×6 IMPLANT
ELECT L-HOOK LAP 45CM DISP (ELECTROSURGICAL)
ELECT REM PT RETURN 15FT ADLT (MISCELLANEOUS) ×1 IMPLANT
ELECTRODE L-HOOK LAP 45CM DISP (ELECTROSURGICAL) IMPLANT
GAUZE SPONGE 2X2 8PLY STRL LF (GAUZE/BANDAGES/DRESSINGS) IMPLANT
GAUZE SPONGE 2X2 STRL 8-PLY (GAUZE/BANDAGES/DRESSINGS) IMPLANT
GAUZE SPONGE 4X4 12PLY STRL (GAUZE/BANDAGES/DRESSINGS) IMPLANT
GLOVE BIO SURGEON STRL SZ7.5 (GLOVE) ×1 IMPLANT
GLOVE INDICATOR 8.0 STRL GRN (GLOVE) ×1 IMPLANT
GOWN STRL REUS W/ TWL XL LVL3 (GOWN DISPOSABLE) ×3 IMPLANT
GOWN STRL REUS W/TWL XL LVL3 (GOWN DISPOSABLE) ×3
GRASPER SUT TROCAR 14GX15 (MISCELLANEOUS) ×1 IMPLANT
IRRIG SUCT STRYKERFLOW 2 WTIP (MISCELLANEOUS) ×1
IRRIGATION SUCT STRKRFLW 2 WTP (MISCELLANEOUS) ×1 IMPLANT
KIT BASIN OR (CUSTOM PROCEDURE TRAY) ×1 IMPLANT
KIT TURNOVER KIT A (KITS) IMPLANT
MARKER SKIN DUAL TIP RULER LAB (MISCELLANEOUS) ×1 IMPLANT
MAT PREVALON FULL STRYKER (MISCELLANEOUS) ×1 IMPLANT
NDL SPNL 22GX3.5 QUINCKE BK (NEEDLE) ×1 IMPLANT
NEEDLE SPNL 22GX3.5 QUINCKE BK (NEEDLE) ×1 IMPLANT
PACK UNIVERSAL I (CUSTOM PROCEDURE TRAY) ×1 IMPLANT
PENCIL SMOKE EVACUATOR (MISCELLANEOUS) IMPLANT
RELOAD STAPLE 60 3.6 BLU REG (STAPLE) ×1 IMPLANT
RELOAD STAPLE 60 3.8 GOLD REG (STAPLE) IMPLANT
RELOAD STAPLE 60 4.1 GRN THCK (STAPLE) ×1 IMPLANT
RELOAD STAPLE 60 BLK VRY/THCK (STAPLE) IMPLANT
RELOAD STAPLER 60MM BLK (STAPLE) IMPLANT
RELOAD STAPLER BLUE 60MM (STAPLE) ×5 IMPLANT
RELOAD STAPLER GOLD 60MM (STAPLE) ×1 IMPLANT
RELOAD STAPLER GREEN 60MM (STAPLE) IMPLANT
SCISSORS LAP 5X45 EPIX DISP (ENDOMECHANICALS) IMPLANT
SEALANT SURGICAL APPL DUAL CAN (MISCELLANEOUS) IMPLANT
SET TUBE SMOKE EVAC HIGH FLOW (TUBING) ×1 IMPLANT
SHEARS HARMONIC ACE PLUS 45CM (MISCELLANEOUS) ×1 IMPLANT
SLEEVE ADV FIXATION 5X100MM (TROCAR) ×2 IMPLANT
SLEEVE GASTRECTOMY 40FR VISIGI (MISCELLANEOUS) ×1 IMPLANT
SOLUTION ANTFG W/FOAM PAD STRL (MISCELLANEOUS) ×1 IMPLANT
SPIKE FLUID TRANSFER (MISCELLANEOUS) ×1 IMPLANT
STAPLE LINE REINFORCEMENT LAP (STAPLE) IMPLANT
STAPLER ECHELON BIOABSB 60 FLE (MISCELLANEOUS) IMPLANT
STAPLER ECHELON LONG 60 440 (INSTRUMENTS) ×1 IMPLANT
STAPLER RELOAD 60MM BLK (STAPLE)
STAPLER RELOAD BLUE 60MM (STAPLE) ×5
STAPLER RELOAD GOLD 60MM (STAPLE) ×1
STAPLER RELOAD GREEN 60MM (STAPLE)
STRIP CLOSURE SKIN 1/2X4 (GAUZE/BANDAGES/DRESSINGS) ×1 IMPLANT
SUT MNCRL AB 4-0 PS2 18 (SUTURE) ×1 IMPLANT
SUT SURGIDAC NAB ES-9 0 48 120 (SUTURE) IMPLANT
SUT VICRYL 0 TIES 12 18 (SUTURE) ×1 IMPLANT
SYR 20ML LL LF (SYRINGE) ×1 IMPLANT
SYR 50ML LL SCALE MARK (SYRINGE) ×1 IMPLANT
SYS KII OPTICAL ACCESS 15MM (TROCAR) ×1
SYSTEM KII OPTICAL ACCESS 15MM (TROCAR) ×1 IMPLANT
TOWEL OR 17X26 10 PK STRL BLUE (TOWEL DISPOSABLE) ×1 IMPLANT
TOWEL OR NON WOVEN STRL DISP B (DISPOSABLE) ×1 IMPLANT
TROCAR ADV FIXATION 5X100MM (TROCAR) ×1 IMPLANT
TROCAR XCEL NON-BLD 5MMX100MML (ENDOMECHANICALS) ×1 IMPLANT
TROCAR Z-THREAD OPTICAL 5X100M (TROCAR) IMPLANT
TUBING CONNECTING 10 (TUBING) ×2 IMPLANT
TUBING ENDO SMARTCAP (MISCELLANEOUS) ×1 IMPLANT

## 2022-08-04 NOTE — H&P (Signed)
PROVIDER: Louana Fontenot Leanne Chang, MD  MRN: [id number] DOB: 26-Apr-1991 DATE OF ENCOUNTER: 07/29/2022 Subjective  Chief Complaint: Pre-op Exam (Pre op sx date 08/04/22 )   History of Present Illness: Colleen Fields is a 32 y.o. female who is seen today for long-term follow-up regarding her obesity and related comorbidities. I have met her several times to discuss bariatric surgery.  Comorbidities included food related reflux, prediabetes and mid to lower chronic low back pain, and migraines, hypertriglyceridemia, mild obstructive sleep apnea  SHe has completed the bariatric surgery evaluation process and has undergone nutritional and psychological evaluation..  Denies any medical changes since I saw her. She states her heartburn is actually resolved. She states that she discovered that they were using a cooking spices and that was the etiology of her worsening heartburn when I saw her last March. She denies any current heartburn. She denies any abdominal pain. No nausea or vomiting. She states that she did have the IUD inserted since her last visit.  She states that she may get a little bit anxious the morning of surgery  SHe has attended her preoperative education class  Review of Systems: A complete review of systems was obtained from the patient. I have reviewed this information and discussed as appropriate with the patient. See HPI as well for other ROS.  ROS  Medical History: Past Medical History: Diagnosis Date Hyperlipidemia Sleep apnea  Patient Active Problem List Diagnosis Prediabetes  Past Surgical History: Procedure Laterality Date CESAREAN SECTION 2011   No Known Allergies  Current Outpatient Medications on File Prior to Visit Medication Sig Dispense Refill aspirin-acetaminophen-caffeine (EXCEDRIN MIGRAINE) 250-250-65 mg per tablet Take 1 tablet by mouth every 6 (six) hours as needed for Pain prenatal vitamin with iron-folic acid (PRENATAL TABLETS) tablet Take 1  tablet by mouth once daily (Patient not taking: Reported on 07/29/2022)  No current facility-administered medications on file prior to visit.  Family History Problem Relation Age of Onset Obesity Mother High blood pressure (Hypertension) Mother Hyperlipidemia (Elevated cholesterol) Mother Diabetes Mother   Social History  Tobacco Use Smoking Status Never Smokeless Tobacco Never   Social History  Socioeconomic History Marital status: Married Tobacco Use Smoking status: Never Smokeless tobacco: Never Substance and Sexual Activity Alcohol use: Not Currently Drug use: Not Currently  Objective:  Vitals: 07/29/22 0856 BP: 130/74 Pulse: 98 Resp: 18 Temp: 36.7 C (98 F) SpO2: 99% Weight: (!) 109.9 kg (242 lb 3.2 oz) Height: 149.9 cm (4\' 11" )  Body mass index is 48.92 kg/m.  Gen: alert, NAD, non-toxic appearing; severe obesity Pupils: equal, no scleral icterus Pulm: Lungs clear to auscultation, symmetric chest rise CV: regular rate and rhythm Abd: soft, nontender, nondistended. No cellulitis. No incisional hernia Ext: no edema, Skin: no rash, no jaundice  Labs, Imaging and Diagnostic Testing: Upper GI 01/02/19 - IMPRESSION: Normal preoperative upper GI. Pcp note 03/27/22, 07/01/22 Outpt PT note 01/14/22 Ob note 01/09/22 Labs 05/27/22 - A1c 5.7; TG 160, LDL 122; bmet/cbc/tsh, iron, ferritin wnl MRI brain 05/14/22 - 1. Upper limits of normal size of the pituitary gland, stable to slightly smaller than on the 2022 head CT and without evidence of an adenoma or other focal abnormality. 2. Otherwise unremarkable appearance of the brain.  ; b/l breast u/s 04/22/22 -->f/u right breast u/s 6 mo Home sleep test 01/06/22 -mild osa  Labs 07/23/22 - cmet, cbc (hgb 11.9), A1c 5.3  Assessment and Plan: Diagnoses and all orders for this visit:  Severe obesity (CMS-HCC)  Mixed hyperlipidemia  Prediabetes  Chronic nonintractable headache, unspecified headache  type  Obstructive sleep apnea hypopnea, mild    We reviewed her workup. She has attended her preoperative education class. I think she is still stable and appropriate for sleeve gastrectomy. We we reviewed the typical hospitalization and the typical postoperative course. We discussed the importance of the preoperative meal plan. SHe read over and signed the surgical consent. All of her questions were asked and answered  This patient encounter took 22 minutes today to perform the following: take history, perform exam, review outside records, interpret imaging, counsel the patient on their diagnosis and document encounter, findings & plan in the EHR  No follow-ups on file.  Leighton Ruff. Redmond Pulling MD FACS General, Minimally Invasive, & Bariatric Surgery Electronically signed by Rudean Curt, MD at 07/29/2022 9:11 AM EST

## 2022-08-04 NOTE — Interval H&P Note (Signed)
History and Physical Interval Note:  08/04/2022 7:19 AM  Colleen Fields  has presented today for surgery, with the diagnosis of MORBID OBESITY.  The various methods of treatment have been discussed with the patient and family. After consideration of risks, benefits and other options for treatment, the patient has consented to  Procedure(s): LAPAROSCOPIC SLEEVE GASTRECTOMY (N/A) UPPER GI ENDOSCOPY (N/A) as a surgical intervention.  The patient's history has been reviewed, patient examined, no change in status, stable for surgery.  I have reviewed the patient's chart and labs.  Questions were answered to the patient's satisfaction.     [name]

## 2022-08-04 NOTE — Progress Notes (Signed)
  Transition of Care [facility]) Screening Note   Patient Details  Name: Jaquesha Boroff Date of Birth: 1991-02-07   Transition of Care [facility]) CM/SW Contact:    Lennart Pall, LCSW Phone Number: 08/04/2022, 1:41 PM    Transition of Care Department [facility]) has reviewed patient and no TOC needs have been identified at this time. We will continue to monitor patient advancement through interdisciplinary progression rounds. If new patient transition needs arise, please place a TOC consult.

## 2022-08-04 NOTE — Progress Notes (Signed)
PHARMACY CONSULT FOR:  Risk Assessment for Post-Discharge VTE Following Bariatric Surgery  Post-Discharge VTE Risk Assessment: This patient's probability of 30-day post-discharge VTE is increased due to the factors marked: x Sleeve gastrectomy   Liver disorder (transplant, cirrhosis, or nonalcoholic steatohepatitis)   Hx of VTE   Hemorrhage requiring transfusion   GI perforation, leak, or obstruction   ====================================================    Female    Age >/=60 years    BMI >/=50 kg/m2    CHF    Dyspnea at Rest    Paraplegia  x  Non-gastric-band surgery    Operation Time >/=3 hr    Return to OR     Length of Stay >/= 3 d   Hypercoagulable condition   Significant venous stasis      Predicted probability of 30-day post-discharge VTE: 0.16% mild  Other patient-specific factors to consider: none  Recommendation for Discharge: No pharmacologic prophylaxis post-discharge   Colleen Fields is a 32 y.o. female who underwent   1/23: sleeve gastrectomy, BMI 49  Case start 0758 Case end 0850   Allergies  Allergen Reactions   Latex Itching and Other (See Comments)    Only occurs when exposed over a long duration, no respiratory issues    Patient Measurements: Height: 4\' 11"  (149.9 cm) Weight: 111 kg (244 lb 11.4 oz) IBW/kg (Calculated) : 43.2 Body mass index is 49.43 kg/m.  No results for input(s): "WBC", "HGB", "HCT", "PLT", "APTT", "CREATININE", "LABCREA", "CREAT24HRUR", "MG", "PHOS", "ALBUMIN", "PROT", "AST", "ALT", "ALKPHOS", "BILITOT", "BILIDIR", "IBILI" in the last 72 hours. Estimated Creatinine Clearance: 112 mL/min (by C-G formula based on SCr of 0.71 mg/dL).    Past Medical History:  Diagnosis Date   Anxiety    Chest pain    Depression    Dysrhythmia    palpitations   History of gestational diabetes mellitus 09/05/2017   [ ]  qyear a1c  09/2020: passed 2h post partum GTT   History of severe pre-eclampsia 09/02/2020   Postpartum  readmission for MGSO4   Migraine    Obesity 07/22/2017   Pre-diabetes    Tachycardia      Medications Prior to Admission  Medication Sig Dispense Refill Last Dose   acetaminophen-caffeine (EXCEDRIN TENSION HEADACHE) 500-65 MG TABS per tablet Take 2 tablets by mouth every 6 (six) hours as needed (headaches).   Past Month   acetaminophen (TYLENOL) 500 MG tablet Take 1,000 mg by mouth every 6 (six) hours as needed for headache.   More than a month      Kayton Dunaj S. Alford Highland, PharmD, BCPS Clinical Staff Pharmacist [email or web address] Alford Highland, Anneka Studer Stillinger 08/04/2022,12:38 PM

## 2022-08-04 NOTE — Progress Notes (Signed)

## 2022-08-04 NOTE — Op Note (Signed)
Preoperative diagnosis: laparoscopic sleeve gastrectomy  Postoperative diagnosis: Same   Procedure: Upper endoscopy   Surgeon: Clovis Riley, M.D.  Anesthesia: Gen.   Description of procedure: The endoscope was placed in the mouth and oropharynx and under endoscopic vision it was advanced to the esophagogastric junction which was identified at 32cm from the teeth.  The pouch was tensely insufflated while the upper abdomen was flooded with irrigation to perform a leak test, which was negative. No bubbles were seen.  The staple line was hemostatic and the lumen was evenly tubular without undue narrowing, angulation or twisting specifically at the incisura angularis. No retained fundus. The lumen was decompressed and the scope was withdrawn without difficulty.    Clovis Riley, M.D. General, Bariatric, & Minimally Invasive Surgery [facility]

## 2022-08-04 NOTE — Transfer of Care (Signed)
Immediate Anesthesia Transfer of Care Note  Patient: Colleen Fields  Procedure(s) Performed: LAPAROSCOPIC SLEEVE GASTRECTOMY UPPER GI ENDOSCOPY  Patient Location: PACU  Anesthesia Type:General  Level of Consciousness: awake and patient cooperative  Airway & Oxygen Therapy: Patient Spontanous Breathing and Patient connected to face mask oxygen  Post-op Assessment: Report given to RN and Post -op Vital signs reviewed and stable  Post vital signs: Reviewed and stable  Last Vitals:  Vitals Value Taken Time  BP 158/92 08/04/22 0900  Temp    Pulse 90 08/04/22 0901  Resp 18 08/04/22 0901  SpO2 100 % 08/04/22 0901  Vitals shown include unvalidated device data.  Last Pain:  Vitals:   08/04/22 0635  TempSrc:   PainSc: 0-No pain         Complications: No notable events documented.

## 2022-08-04 NOTE — Op Note (Signed)
08/04/2022 Colleen Fields Jan 03, 1991 [id number]   PRE-OPERATIVE DIAGNOSIS:   Severe obesity (BMI 49)  Mixed hyperlipidemia  Prediabetes  Chronic nonintractable headache, unspecified headache type  Obstructive sleep apnea hypopnea, mild   POST-OPERATIVE DIAGNOSIS:  same  PROCEDURE:  Procedure(s): LAPAROSCOPIC SLEEVE GASTRECTOMY  UPPER GI ENDOSCOPY Laparoscopic bilateral TAP block  SURGEON:  Surgeon(s): Gayland Curry, MD FACS FASMBS  ASSISTANTS: Romana Juniper MD FACS; Sherlean Foot RNFA  ANESTHESIA:   general  DRAINS: none   BOUGIE: 40 fr ViSiGi  LOCAL MEDICATIONS USED:   Exparel  EBL: minimal  SPECIMEN:  Source of Specimen:  Greater curvature of stomach  DISPOSITION OF SPECIMEN:  PATHOLOGY  COUNTS:  YES  INDICATION FOR PROCEDURE: This is a very pleasant 32 y.o.-year-old morbidly obese female who has had unsuccessful attempts for sustained weight loss. The patient presents today for a planned laparoscopic sleeve gastrectomy with upper endoscopy. We have discussed the risk and benefits of the procedure extensively preoperatively. Please see my separate notes.  PROCEDURE: After obtaining informed consent and receiving 5000 units of subcutaneous heparin, the patient was brought to the operating room at [facility] and placed supine on the operating room table. General endotracheal anesthesia was established. Sequential compression devices were placed. A orogastric tube was placed. The patient's abdomen was prepped and draped in the usual standard surgical fashion. The patient received preoperative IV antibiotic. A surgical timeout was performed. ERAS protocol used.   Access to the abdomen was achieved using a 5 mm 0 laparoscope thru a 5 mm trocar In the left upper Quadrant 2 fingerbreadths below the left subcostal margin using the Optiview technique. Pneumoperitoneum was smoothly established up to 15 mm of mercury. The laparoscope was advanced and the abdominal  cavity was surveilled. The patient was then placed in reverse Trendelenburg.   A 5 mm trocar was placed slightly above and to the left of the umbilicus under direct visualization.  The [facility] liver retractor was placed under the left lobe of the liver through a 5 mm trocar incision site in the subxiphoid position. A 5 mm trocar was placed in the lateral right upper quadrant along with a 15 mm trocar in the mid right abdomen. A final 5 mm trocar was placed in the lateral LUQ.  All under direct visualization after exparel had been infiltrated in bilateral lateral upper abdominal walls as a TAP block for postoperative pain relief.  The stomach was inspected. It was completely decompressed.  There was no anterior dimple that was obviously visible. Her preop imaging showed no hiatal hernia.   We identified the pylorus and measured 6 cm proximal to the pylorus and identified an area of where we would start taking down the short gastric vessels. Harmonic scalpel was used to take down the short gastric vessels along the greater curvature of the stomach. We were able to enter the lesser sac. We continued to march along the greater curvature of the stomach taking down the short gastrics. As we approached the gastrosplenic ligament we took care in this area not to injure the spleen. We were able to take down the entire gastrosplenic ligament. We then mobilized the fundus away from the left crus of diaphragm. There were not any significant posterior gastric avascular attachments. This left the stomach completely mobilized. No vessels had been taken down along the lesser curvature of the stomach.  We then reidentified the pylorus. A 40Fr ViSiGi was then placed in the oropharynx and advanced down into the stomach and  placed in the distal antrum and positioned along the lesser curvature. It was placed under suction which secured the 40Fr ViSiGi in place along the lesser curve. Then using the Ethicon echelon 60 mm  stapler with a gold load with ethicon staple line reinforcement (ESLR), I placed a stapler along the antrum approximately 5 cm from the pylorus. The stapler was angled so that there is ample room at the angularis incisura. I then fired the first staple load after inspecting it posteriorly to ensure adequate space both anteriorly and posteriorly. At this point I still was not completely past the angularis so with a blue load with ESLR, I placed the stapler in position just inside the prior stapleline. We then rotated the stomach to insure that there was adequate anteriorly as well as posteriorly. The stapler was then fired.  At this point I continued using 60 mm gold load staple cartridges with ESLR. The echelon stapler was then repositioned with a 60 mm blue load with ESLR and we continued to march up along the [place]. My assistant was holding traction along the greater curvature stomach along the cauterized short gastric vessels ensuring that the stomach was symmetrically retracted. Prior to each firing of the staple, we rotated the stomach to ensure that there is adequate stomach left.  As we approached the fundus, I used 60 mm blue cartridge with ESLR aiming  lateral to the GE junction after mobilizing some of the esophageal fat pad.  The sleeve was inspected. There is no evidence of cork screw. The staple line appeared hemostatic. The CRNA inflated the ViSiGi to the green zone and the upper abdomen was flooded with saline. There were no bubbles. The sleeve was decompressed and the ViSiGi removed.  Pneumoperitoneum was reduced to 8 mmHg so we can inspect for bleeding along the staple line.  My assistant scrubbed out and performed an upper endoscopy. The sleeve easily distended with air and the scope was easily advanced to the pylorus. There is no evidence of internal bleeding or cork screwing. There was no narrowing at the angularis. There is no evidence of bubbles. Please see her operative note for further  details. The gastric sleeve was decompressed and the endoscope was removed.  The greater curvature the stomach was grasped with a laparoscopic grasper and removed from the 15 mm trocar site.  The liver retractor was removed. I then closed the 15 mm trocar site with 1 interrupted 0 Vicryl sutures through the fascia using the endoclose. The closure was viewed laparoscopically and it was airtight. Remaining Exparel was then infiltrated in the preperitoneal spaces around the trocar sites. Pneumoperitoneum was released. All trocar sites were closed with a 4-0 Monocryl in a subcuticular fashion followed by the application of steri-strips, and bandaids. The patient was extubated and taken to the recovery room in stable condition. All needle, instrument, and sponge counts were correct x2. There are no immediate complications  (0) 60 mm green with ESLR (1) 60 mm gold with ESLR (5) 60 mm blue with ESLR  PLAN OF CARE: Admit to inpatient   PATIENT DISPOSITION:  PACU - hemodynamically stable.   Delay start of Pharmacological VTE agent (>24hrs) due to surgical blood loss or risk of bleeding:  no  Leighton Ruff. Redmond Pulling, MD, FACS FASMBS General, Bariatric, & Minimally Invasive Surgery [facility], [place]

## 2022-08-04 NOTE — Anesthesia Postprocedure Evaluation (Signed)
Anesthesia Post Note  Patient: Maysen Bonsignore  Procedure(s) Performed: LAPAROSCOPIC SLEEVE GASTRECTOMY UPPER GI ENDOSCOPY     Patient location during evaluation: PACU Anesthesia Type: General Level of consciousness: sedated Pain management: pain level controlled Vital Signs Assessment: post-procedure vital signs reviewed and stable Respiratory status: spontaneous breathing and respiratory function stable Cardiovascular status: stable Postop Assessment: no apparent nausea or vomiting Anesthetic complications: no   No notable events documented.  Last Vitals:  Vitals:   08/04/22 1045 08/04/22 1100  BP: (!) 145/86 (!) 143/70  Pulse: 89 88  Resp: (!) 8 16  Temp:    SpO2: 100% 100%                  Hanna Ra DANIEL

## 2022-08-04 NOTE — Anesthesia Procedure Notes (Signed)
Procedure Name: Intubation Date/Time: 08/04/2022 7:33 AM  Performed by: Eben Burow, CRNAPre-anesthesia Checklist: Patient identified, Emergency Drugs available, Suction available, Patient being monitored and Timeout performed Patient Re-evaluated:Patient Re-evaluated prior to induction Oxygen Delivery Method: Circle system utilized Preoxygenation: Pre-oxygenation with 100% oxygen Induction Type: IV induction Ventilation: Mask ventilation without difficulty Laryngoscope Size: Mac and 4 Grade View: Grade I Tube type: Oral Tube size: 7.0 mm Number of attempts: 1 Airway Equipment and Method: Stylet Placement Confirmation: ETT inserted through vocal cords under direct vision, positive ETCO2 and breath sounds checked- equal and bilateral Secured at: 21 cm Tube secured with: Tape Dental Injury: Teeth and Oropharynx as per pre-operative assessment

## 2022-08-04 NOTE — Discharge Instructions (Signed)
GASTRIC BYPASS / SLEEVE  Home Care Instructions  These instructions are to help you care for yourself when you go home.  Call: If you have any problems. Call [phone] and ask for the surgeon on call If you have an emergency related to your surgery please use the ER at [facility].  Tell the ER staff that you are a new post-op gastric bypass or gastric sleeve patient   Signs and symptoms to report: Severe vomiting or nausea If you cannot handle clear liquids for longer than 1 day, call your surgeon  Abdominal pain which does not get better after taking your pain medication Fever greater than 100.4 F and chills Heart rate over 100 beats a minute Trouble breathing Chest pain  Redness, swelling, drainage, or foul odor at incision (surgical) sites  If your incisions open or pull apart Swelling or pain in calf (lower leg) Diarrhea (Loose bowel movements that happen often), frequent watery, uncontrolled bowel movements Constipation, (no bowel movements for 3 days) if this happens:  Take Milk of Magnesia, 2 tablespoons by mouth, 3 times a day for 2 days if needed Stop taking Milk of Magnesia once you have had a bowel movement Call your doctor if constipation continues Or Take Miralax  (instead of Milk of Magnesia) following the label instructions Stop taking Miralax once you have had a bowel movement Call your doctor if constipation continues Anything you think is "abnormal for you"   Normal side effects after surgery: Unable to sleep at night or unable to concentrate Irritability Being tearful (crying) or depressed These are common complaints, possibly related to your anesthesia, stress of surgery and change in lifestyle, that usually go away a few weeks after surgery.  If these feelings continue, call your medical doctor.  Wound Care: You may have surgical glue, steri-strips, or staples over your incisions after surgery Surgical glue:  Looks like a clear film over your incisions  and will wear off a little at a time Steri-strips : Adhesive strips of tape over your incisions. You may notice a yellowish color on the skin under the steri-strips. This is used to make the   steri-strips stick better. Do not pull the steri-strips off - let them fall off Staples: Staples may be removed before you leave the hospital If you go home with staples, call [facility] at for an appointment with your surgeon's nurse to have staples removed 10 days after surgery, [phone] Showering: You may shower two (2) days after your surgery unless your surgeon tells you differently Wash gently around incisions with warm soapy water, rinse well, and gently pat dry  If you have a drain (tube from your incision), you may need someone to hold this while you shower  No tub baths until staples are removed and incisions are healed     Medications: Medications should be liquid or crushed if larger than the size of a dime Extended release pills (medication that releases a little bit at a time through the day) should not be crushed Depending on the size and number of medications you take, you may need to space (take a few throughout the day)/change the time you take your medications so that you do not over-fill your pouch (smaller stomach) Make sure you follow-up with your primary care physician to make medication changes needed during rapid weight loss and life-style changes If you have diabetes, follow up with the doctor that orders your diabetes medication(s) within one week after surgery and check  your blood sugar regularly. Do not drive while taking narcotics (pain medications) DO NOT take NSAID'S (Examples of NSAID's include ibuprofen, naproxen)  Diet:                    First 2 Weeks  You will see the nutritionist about two (2) weeks after your surgery. The nutritionist will increase the types of foods you can eat if you are handling liquids well: If you have severe vomiting or nausea  and cannot handle clear liquids lasting longer than 1 day, call your surgeon  Protein Shake Drink at least 2 ounces of shake 5-6 times per day Each serving of protein shakes (usually 8 - 12 ounces) should have a minimum of:  15 grams of protein  And no more than 5 grams of carbohydrate  Goal for protein each day: Men = 80 grams per day Women = 60 grams per day Protein powder may be added to fluids such as non-fat milk or Lactaid milk or Soy milk (limit to 35 grams added protein powder per serving)  Hydration Slowly increase the amount of water and other clear liquids as tolerated (See Acceptable Fluids) Slowly increase the amount of protein shake as tolerated   Sip fluids slowly and throughout the day May use sugar substitutes in small amounts (no more than 6 - 8 packets per day; i.e. Splenda)  Fluid Goal The first goal is to drink at least 8 ounces of protein shake/drink per day (or as directed by the nutritionist);  See handout from pre-op Bariatric Education Class for examples of protein shake/drink.   Slowly increase the amount of protein shake you drink as tolerated You may find it easier to slowly sip shakes throughout the day It is important to get your proteins in first Your fluid goal is to drink 64 - 100 ounces of fluid daily It may take a few weeks to build up to this 32 oz (or more) should be clear liquids  And  32 oz (or more) should be full liquids (see below for examples) Liquids should not contain sugar, caffeine, or carbonation  Clear Liquids: Water or Sugar-free flavored water (i.e. Fruit H2O, Propel) Decaffeinated coffee or tea (sugar-free) Crystal Lite, Wyler's Lite, Minute Maid Lite Sugar-free Jell-O Bouillon or broth Sugar-free Popsicle:   *Less than 20 calories each; Limit 1 per day  Full Liquids: Protein Shakes/Drinks + 2 choices per day of other full liquids Full liquids must be: No More Than 12 grams of Carbs per serving  No More Than 3 grams of Fat  per serving Strained low-fat cream soup Non-Fat milk Fat-free Lactaid Milk Sugar-free yogurt (Dannon Lite & Fit, Greek yogurt)      Vitamins and Minerals Start 1 day after surgery unless otherwise directed by your surgeon Bariatric Specific Complete Multivitamins Chewable Calcium Citrate with Vitamin D-3 (Example: 3 Chewable Calcium Plus 600 with Vitamin D-3) Take 500 mg three (3) times a day for a total of 1500 mg each day Do not take all 3 doses of calcium at one time as it may cause constipation, and you can only absorb 500 mg  at a time  Do not mix multivitamins containing iron with calcium supplements; take 2 hours apart  Menstruating women and those at risk for anemia (a blood disease that causes weakness) may need extra iron Talk with your doctor to see if you need more iron If you need extra iron: Total daily Iron recommendation (including Vitamins) is 50 to 100  mg Iron/day Do not stop taking or change any vitamins or minerals until you talk to your nutritionist or surgeon Your nutritionist and/or surgeon must approve all vitamin and mineral supplements   Activity and Exercise: It is important to continue walking at home.  Limit your physical activity as instructed by your doctor.  During this time, use these guidelines: Do not lift anything greater than ten (10) pounds for at least two (2) weeks Do not go back to work or drive until Designer, industrial/product says you can You may have sex when you feel comfortable  It is VERY important for female patients to use a reliable birth control method; fertility often increases after surgery  Do not get pregnant for at least 18 months Start exercising as soon as your doctor tells you that you can Make sure your doctor approves any physical activity Start with a simple walking program Walk 5-15 minutes each day, 7 days per week.  Slowly increase until you are walking 30-45 minutes per day Consider joining our BELT program. [phone] or email  [email or web address]   Special Instructions Things to remember:  Use your CPAP when sleeping if this applies to you, do not stop the use of CPAP unless directed by physician after a sleep study [facility] has a free Bariatric Surgery Support Group that meets monthly, the 3rd Thursday, 6 pm.  Please review discharge information for date and location of this meeting. It is very important to keep all follow up appointments with your surgeon, nutritionist, primary care physician, and behavioral health practitioner After the first year, please follow up with your bariatric surgeon and nutritionist at least once a year in order to maintain best weight loss results   [facility]: [phone] [facility]: [phone] Bariatric Nurse Coordinator: [phone]     SUCCESS GROUP: Meets on every 3rd Thursday of the Month. Please plan to attend the support first meeting virtually and then additional meetings can be in-person. The next meeting is in February!

## 2022-08-05 ENCOUNTER — Encounter (HOSPITAL_COMMUNITY): Payer: Self-pay | Admitting: General Surgery

## 2022-08-05 LAB — CBC WITH DIFFERENTIAL/PLATELET
Abs Immature Granulocytes: 0.01 K/uL (ref 0.00–0.07)
Basophils Absolute: 0 K/uL (ref 0.0–0.1)
Basophils Relative: 0 %
Eosinophils Absolute: 0 K/uL (ref 0.0–0.5)
Eosinophils Relative: 0 %
HCT: 32.9 % — ABNORMAL LOW (ref 36.0–46.0)
Hemoglobin: 11.1 g/dL — ABNORMAL LOW (ref 12.0–15.0)
Immature Granulocytes: 0 %
Lymphocytes Relative: 23 %
Lymphs Abs: 1.5 K/uL (ref 0.7–4.0)
MCH: 30.2 pg (ref 26.0–34.0)
MCHC: 33.7 g/dL (ref 30.0–36.0)
MCV: 89.4 fL (ref 80.0–100.0)
Monocytes Absolute: 0.6 K/uL (ref 0.1–1.0)
Monocytes Relative: 9 %
Neutro Abs: 4.4 K/uL (ref 1.7–7.7)
Neutrophils Relative %: 68 %
Platelets: 315 K/uL (ref 150–400)
RBC: 3.68 MIL/uL — ABNORMAL LOW (ref 3.87–5.11)
RDW: 12.8 % (ref 11.5–15.5)
WBC: 6.5 K/uL (ref 4.0–10.5)
nRBC: 0 % (ref 0.0–0.2)

## 2022-08-05 LAB — COMPREHENSIVE METABOLIC PANEL WITH GFR
ALT: 25 U/L (ref 0–44)
AST: 21 U/L (ref 15–41)
Albumin: 3.4 g/dL — ABNORMAL LOW (ref 3.5–5.0)
Alkaline Phosphatase: 34 U/L — ABNORMAL LOW (ref 38–126)
Anion gap: 9 (ref 5–15)
BUN: 6 mg/dL (ref 6–20)
CO2: 24 mmol/L (ref 22–32)
Calcium: 8.4 mg/dL — ABNORMAL LOW (ref 8.9–10.3)
Chloride: 104 mmol/L (ref 98–111)
Creatinine, Ser: 0.79 mg/dL (ref 0.44–1.00)
GFR, Estimated: >60 mL/min
Glucose, Bld: 137 mg/dL — ABNORMAL HIGH (ref 70–99)
Potassium: 3.7 mmol/L (ref 3.5–5.1)
Sodium: 137 mmol/L (ref 135–145)
Total Bilirubin: 0.5 mg/dL (ref 0.3–1.2)
Total Protein: 7.3 g/dL (ref 6.5–8.1)

## 2022-08-05 NOTE — Progress Notes (Signed)
1 Day Post-Op   Subjective/Chief Complaint: Still on water Nausea Walked Not much pain  Objective: Vital signs in last 24 hours: Temp:  [98.2 F (36.8 C)-99.4 F (37.4 C)] 98.9 F (37.2 C) (01/24 1343) Pulse Rate:  [84-102] 84 (01/24 1343) Resp:  [18] 18 (01/24 1343) BP: (121-154)/(74-110) 136/96 (01/24 1343) SpO2:  [97 %-100 %] 99 % (01/24 1343) Last BM Date : 08/03/22  Intake/Output from previous day: 01/23 0701 - 01/24 0700 In: 2930.8 [P.O.:240; I.V.:2590.8; IV Piggyback:100] Out: 3900 [Urine:3900] Intake/Output this shift: Total I/O In: -  Out: 500 [Urine:500]  Alert, nurse at  [facility] Nontoxic Symm chest rise Nonlabored Reg Soft, obese, incisions ok, expected mild TTP  Lab Results:  Recent Labs    08/04/22 1204 08/05/22 0439  WBC 9.6 6.5  HGB 10.4* 11.1*  HCT 31.2* 32.9*  PLT 268 315   BMET Recent Labs    08/04/22 1204 08/05/22 0439  NA 133* 137  K 3.2* 3.7  CL 100 104  CO2 21* 24  GLUCOSE 154* 137*  BUN 13 6  CREATININE 0.64 0.79  CALCIUM 8.1* 8.4*   PT/INR No results for input(s): "LABPROT", "INR" in the last 72 hours. ABG No results for input(s): "PHART", "HCO3" in the last 72 hours.  Invalid input(s): "PCO2", "PO2"  Studies/Results: No results found.  Anti-infectives: Anti-infectives (From admission, onward)    Start     Dose/Rate Route Frequency Ordered Stop   08/04/22 0600  cefoTEtan (CEFOTAN) 2 g in sodium chloride 0.9 % 100 mL IVPB        2 g 200 mL/hr over 30 Minutes Intravenous On call to O.R. 08/04/22 0533 08/04/22 0754       Assessment/Plan: s/p Procedure(s): LAPAROSCOPIC SLEEVE GASTRECTOMY (N/A) UPPER GI ENDOSCOPY (N/A)  Cont bari diet as tolerated No fever.  No wbc Cont chemical vte prophylaxis Nausea control  Recheck with nursing staff this pm- updated Unfortunately, pt's oral intake not safe for dc; high risk of dehydration. Needs another night  LOS: 1 day    [name] 08/05/2022

## 2022-08-05 NOTE — Progress Notes (Signed)
Patient alert and oriented, Post op day 1.  Pt reported feeling nauseas, reminded pt to sit upright and pt reported feeling better after adjusting. Provided support and encouragement. Encouraged incentive spirometer use, toileting, ambulation and small sips of liquids.  All questions answered.  Will continue to monitor.

## 2022-08-06 LAB — CBC WITH DIFFERENTIAL/PLATELET
Abs Immature Granulocytes: 0.01 10*3/uL (ref 0.00–0.07)
Basophils Absolute: 0 10*3/uL (ref 0.0–0.1)
Basophils Relative: 1 %
Eosinophils Absolute: 0 10*3/uL (ref 0.0–0.5)
Eosinophils Relative: 0 %
HCT: 32 % — ABNORMAL LOW (ref 36.0–46.0)
Hemoglobin: 10.6 g/dL — ABNORMAL LOW (ref 12.0–15.0)
Immature Granulocytes: 0 %
Lymphocytes Relative: 39 %
Lymphs Abs: 2 10*3/uL (ref 0.7–4.0)
MCH: 30.1 pg (ref 26.0–34.0)
MCHC: 33.1 g/dL (ref 30.0–36.0)
MCV: 90.9 fL (ref 80.0–100.0)
Monocytes Absolute: 0.4 10*3/uL (ref 0.1–1.0)
Monocytes Relative: 7 %
Neutro Abs: 2.8 10*3/uL (ref 1.7–7.7)
Neutrophils Relative %: 53 %
Platelets: 295 10*3/uL (ref 150–400)
RBC: 3.52 MIL/uL — ABNORMAL LOW (ref 3.87–5.11)
RDW: 13 % (ref 11.5–15.5)
WBC: 5.2 10*3/uL (ref 4.0–10.5)
nRBC: 0 % (ref 0.0–0.2)

## 2022-08-06 LAB — SURGICAL PATHOLOGY

## 2022-08-06 MED ORDER — PANTOPRAZOLE SODIUM 40 MG PO TBEC: 40.0000 mg | DELAYED_RELEASE_TABLET | Freq: Every day | ORAL | 0 refills | Status: DC

## 2022-08-06 MED ORDER — OXYCODONE HCL 5 MG PO TABS: 5.0000 mg | ORAL_TABLET | Freq: Four times a day (QID) | ORAL | 0 refills | Status: DC | PRN

## 2022-08-06 MED ORDER — ONDANSETRON 4 MG PO TBDP: 4.0000 mg | ORAL_TABLET | Freq: Four times a day (QID) | ORAL | 0 refills | Status: DC | PRN

## 2022-08-06 NOTE — Progress Notes (Signed)
Patient alert and oriented, Post op day 2.  Provided support and encouragement.  Encouraged incentive spirometer use, toilet use, ambulation, and small sips of liquids. Pt walked 2.5 laps down the hall and tolerated well. All questions answered. Communicated update with Psychologist, sport and exercise. Will continue to monitor.    Thank you,  Calton Dach, RN, MSN Bariatric Nurse Coordinator [phone] (office)

## 2022-08-06 NOTE — Plan of Care (Signed)
Patient is stable for discharge. Discharge instructions have been given. All questions answered. Patient is discharged to home with family.  

## 2022-08-06 NOTE — Progress Notes (Signed)
Updated surgeon of pt progress. Rounded on pt and communicated update of staying an additional night to ensure readiness for discharge.

## 2022-08-06 NOTE — Discharge Summary (Signed)
Physician Discharge Summary  Colleen Fields GMW:[id number] DOB: 1990/07/22 DOA: 08/04/2022  PCP: [facility]  Admit date: 08/04/2022 Discharge date: 08/06/2022  Recommendations for Outpatient Follow-up:    Follow-up Information     [name], MD Follow up on 08/26/2022.   Specialty: General Surgery Why: Please arrive 15 minutes prior to your appointment at 11:15am Contact information: [place] [address] [phone]         Malachi Pro [place], [place]. Go on 08/31/2022.   Specialty: General Surgery Why: Please arrive 15 minutes prior to your appointment at 2:15 with Carlena Hurl on behalf of Dr. Cyndi Bender information: [place] [id number] [phone]                Discharge Diagnoses:  Principal Problem:   S/P laparoscopic sleeve gastrectomy Severe obesity (BMI 49)  Mixed hyperlipidemia  Prediabetes  Chronic nonintractable headache, unspecified headache type  Obstructive sleep apnea hypopnea, mild   Surgical Procedure: Laparoscopic Sleeve Gastrectomy, upper endoscopy  Discharge Condition: Good Disposition: Home  Diet recommendation: Postoperative sleeve gastrectomy diet (liquids only)  Filed Weights   08/04/22 0635  Weight: 111 kg     Hospital Course:  The patient was admitted for a planned laparoscopic sleeve gastrectomy. Please see operative note. Preoperatively the patient was given 5000 units of subcutaneous heparin for DVT prophylaxis. Postoperative prophylactic heparin dosing was started on the evening of postoperative day 0. ERAS protocol was used. On the evening of postoperative day 0, the patient was started on water and ice chips. On postoperative day 1 the patient had no fever or tachycardia and was tolerating water in their diet was gradually advanced throughout the day; however her oral intake was not sufficient  enough for discharge but she was having some issues with nausea.  Postop day 2,. The patient was ambulating without difficulty. Their vital signs are stable without fever or tachycardia. Their hemoglobin had remained stable.  Her oral intake was sufficiently improved and her nausea had resolved.  The patient had received discharge instructions and counseling. They were deemed stable for discharge and had met discharge criteria  BP 124/87 (BP Location: Right Arm)   Pulse 72   Temp 98.3 F (36.8 C) (Oral)   Resp 18   Ht 4\' 11"  (1.499 m)   Wt 111 kg   LMP 07/27/2022 Comment: non-hormonal IUD  SpO2 100%   BMI 49.43 kg/m   Gen: alert, NAD, non-toxic appearing Pupils: equal, no scleral icterus Pulm: Lungs clear to auscultation, symmetric chest rise CV: regular rate and rhythm Abd: soft, mild approp tender, nondistended. Incisions ok.  No cellulitis. No incisional hernia Ext: no edema, no calf tenderness Skin: no rash, no jaundice   Discharge Instructions  Discharge Instructions     Ambulate hourly while awake   Complete by: As directed    Call MD for:  difficulty breathing, headache or visual disturbances   Complete by: As directed    Call MD for:  persistant dizziness or light-headedness   Complete by: As directed    Call MD for:  persistant nausea and vomiting   Complete by: As directed    Call MD for:  redness, tenderness, or signs of infection (pain, swelling, redness, odor or green/yellow discharge around incision site)   Complete by: As directed    Call MD for:  severe uncontrolled pain   Complete by: As directed  Call MD for:  temperature >101 F   Complete by: As directed    Diet bariatric full liquid   Complete by: As directed    Discharge instructions   Complete by: As directed    See bariatric discharge instructions   Incentive spirometry   Complete by: As directed    Perform hourly while awake      Allergies as of 08/06/2022       Reactions   Latex  Itching, Other (See Comments)   Only occurs when exposed over a long duration, no respiratory issues        Medication List     STOP taking these medications    Excedrin Tension Headache 500-65 MG Tabs per tablet Generic drug: acetaminophen-caffeine       TAKE these medications    acetaminophen 500 MG tablet Commonly known as: TYLENOL Take 1,000 mg by mouth every 6 (six) hours as needed for headache.   ondansetron 4 MG disintegrating tablet Commonly known as: ZOFRAN-ODT Take 1 tablet (4 mg total) by mouth every 6 (six) hours as needed for nausea or vomiting.   oxyCODONE 5 MG immediate release tablet Commonly known as: Oxy IR/ROXICODONE Take 1 tablet (5 mg total) by mouth every 6 (six) hours as needed for severe pain.   pantoprazole 40 MG tablet Commonly known as: PROTONIX Take 1 tablet (40 mg total) by mouth daily.        Follow-up Information     [name], MD Follow up on 08/26/2022.   Specialty: General Surgery Why: Please arrive 15 minutes prior to your appointment at 11:15am Contact information: [place] [address] [phone]         Malachi Pro [place], [place]. Go on 08/31/2022.   Specialty: General Surgery Why: Please arrive 15 minutes prior to your appointment at 2:15 with Carlena Hurl on behalf of Dr. Cyndi Bender information: [place] [address] [phone]                  The results of significant diagnostics from this hospitalization (including imaging, microbiology, ancillary and laboratory) are listed below for reference.    Significant Diagnostic Studies: No results found.  Labs: Basic Metabolic Panel: Recent Labs  Lab 08/04/22 1204 08/05/22 0439  NA 133* 137  K 3.2* 3.7  CL 100 104  CO2 21* 24  GLUCOSE 154* 137*  BUN 13 6  CREATININE 0.64 0.79  CALCIUM 8.1* 8.4*   Liver Function Tests: Recent Labs  Lab 08/05/22 0439   AST 21  ALT 25  ALKPHOS 34*  BILITOT 0.5  PROT 7.3  ALBUMIN 3.4*    CBC: Recent Labs  Lab 08/04/22 1204 08/05/22 0439 08/06/22 0435  WBC 9.6 6.5 5.2  NEUTROABS  --  4.4 2.8  HGB 10.4* 11.1* 10.6*  HCT 31.2* 32.9* 32.0*  MCV 90.4 89.4 90.9  PLT 268 315 295    CBG: Recent Labs  Lab 08/04/22 0611  GLUCAP 106*    Principal Problem:   S/P laparoscopic sleeve gastrectomy   Time coordinating discharge: 15 min  Signed:  Gayland Curry, MD [facility], [place] [phone] 08/06/2022, 10:15 AM

## 2022-08-10 ENCOUNTER — Telehealth (HOSPITAL_COMMUNITY): Payer: Self-pay | Admitting: *Deleted

## 2022-08-10 NOTE — Telephone Encounter (Signed)
This is just a follow-up to the previous conversation after the call dropped.  Pt reported having a BM on today (08/10/22) after taking Milk of Mag. She also reported feeling much better and less gassy. Pt encouraged to reach out if need for any additional questions or concerns.

## 2022-08-10 NOTE — Telephone Encounter (Signed)
Post-op phone call with the pt   Due to the pt report of self and concerns; I have notified [facility] nurse staff via phone and will notifiy [facility] dietician to reach out to the pt as well.   Newest update the pt reports still currently breastfeeding (possible increased risk for nutrient deficiency and dehydration) and feeling very tired throughout the day.  1. Tell me about your pain and pain management?  Pt reports constant burning sensation not by any incisions but rather inside near where the pouch was made. Pt states that she can just feel it.  Pt can tolerate protein shakes and water. Pt instructed to call [facility] if pain worsens. Otherwise pt didn't report any pain in the incision area  2. Let's talk about fluid intake. How much total fluid are you taking in?  Pt states that s/he is getting in approximately 34oz of fluid including protein shakes, bottled water. Pt states that s/he is working to meet goal of 64 oz of fluid today, but is having a really hard time due to feeling full. Pt has been able to consume approx. 24-34oz of fluid per day since surgery.   Pt encouraged to continue to work towards meeting goal. Pt instructed to assess status and suggestions daily utilizing Hydration Action Plan on discharge folder and to call [facility] if having signs or symptoms in the "yellow or red zone".  3. How much protein have you taken in the last 2 days?  Pt states s/he is halfway meeting her goal of 60g of protein each day with the protein shakes.  She stated she just got protein water on yesterday and will give it a try. Pt reported that she is currently breastfeeding. Reminded pt of importance of getting appropriate nutrients in considering her circumstances. She reported that she has been trying to wean the baby off since he was 16 months however since she works from home and has "easier access" he tends to cluster feed. Encouraged pt to see if some alternatives could be provided for the now 33 year old or  a weaning plan could be considered of what works best for the family since she has mentioned trying to stop and feeling tired.   4. Have you had nausea? Tell me about when have experienced nausea and what you did to help? Pt denies nausea.  5. Has the frequency or color changed with your urine?  Pt states that s/he is urinating "fine" with no changes in frequency or urgency.  6. Tell me what your incisions look like?  "Incisions look fine" with the exception of 1 that has lost its steri-strips on the right side. Pt states it "opened up and she got some more steri-strips that she put on it". It has drained but only a clear yellow that has begun to decrease in amount oozing.   7. Have you been passing gas? BM? *Phone call disconnected prior to pt response. Attempt will be made to call pt back.   Pt denies dehydration symptoms. Pt can describe s/sx of dehydration. Pt knows to call [facility] for surgical, [facility] for nutrition, and [facility] for non-urgent questions or concerns.  8. How has the walking going?  Pt states s/he is walking around trying to keep up with her little ones.  9. Are you still using your incentive spirometer? If so, how often?  Pt states that s/he is doing the I.S. Pt encouraged to use incentive spirometer, at least 10x every hour while awake until s/he sees  the surgeon. Pt reports feeling short of breath throughout the day, reminded pt about signs and symptoms of Pulmonary Embolism and to seek help if those symptoms arise.  10. How are your vitamins and calcium going? How are you taking them?  Pt states that s/he will begin the supplements this week. She ordered something called Bari-Life. Reinforced education about getting dietician approved multivitamins to take to ensure appropriate nutrients are obtained.   Reminded patient that the first 30 days post-operatively are important for successful recovery. Encouraged pt to consider getting help with things around the home to  help with the recovery period.

## 2022-08-11 ENCOUNTER — Other Ambulatory Visit (INDEPENDENT_AMBULATORY_CARE_PROVIDER_SITE_OTHER): Payer: No Typology Code available for payment source

## 2022-08-11 ENCOUNTER — Other Ambulatory Visit: Payer: Self-pay | Admitting: Student

## 2022-08-11 ENCOUNTER — Ambulatory Visit (INDEPENDENT_AMBULATORY_CARE_PROVIDER_SITE_OTHER): Payer: No Typology Code available for payment source

## 2022-08-11 ENCOUNTER — Other Ambulatory Visit: Payer: Self-pay | Admitting: General Surgery

## 2022-08-11 VITALS — BP 120/76 | HR 89 | Temp 98.0°F | Resp 18 | Wt 231.8 lb

## 2022-08-11 DIAGNOSIS — E860 Dehydration: Secondary | ICD-10-CM

## 2022-08-11 LAB — COMPREHENSIVE METABOLIC PANEL
ALT: 21 U/L (ref 0–35)
AST: 18 U/L (ref 0–37)
Albumin: 4 g/dL (ref 3.5–5.2)
Alkaline Phosphatase: 53 U/L (ref 39–117)
BUN: 12 mg/dL (ref 6–23)
CO2: 25 mEq/L (ref 19–32)
Calcium: 9.1 mg/dL (ref 8.4–10.5)
Chloride: 102 mEq/L (ref 96–112)
Creatinine, Ser: 0.73 mg/dL (ref 0.40–1.20)
GFR: 108.71 mL/min (ref >60.00)
Glucose, Bld: 100 mg/dL — ABNORMAL HIGH (ref 70–99)
Potassium: 3.8 mEq/L (ref 3.5–5.1)
Sodium: 137 mEq/L (ref 135–145)
Total Bilirubin: 0.4 mg/dL (ref 0.2–1.2)
Total Protein: 7.5 g/dL (ref 6.0–8.3)

## 2022-08-11 MED ORDER — SODIUM CHLORIDE 0.9 % IV BOLUS
2000.0000 mL | Freq: Once | INTRAVENOUS | Status: AC
  Administered 2022-08-11: 2000 mL via INTRAVENOUS
  Filled 2022-08-11: qty 2000

## 2022-08-11 NOTE — Progress Notes (Signed)
Diagnosis: Dehydration  Provider:  Marshell Garfinkel MD  Procedure: Infusion  IV Type: Peripheral, IV Location: R Forearm  Normal Saline, Dose: 2038ml  Infusion Start Time: 0939  Infusion Stop Time: 1151  Post Infusion IV Care: Peripheral IV Discontinued  Discharge: Condition: Good, Destination: Home . AVS provided to patient.   Performed by:  Arnoldo Morale, RN

## 2022-08-12 ENCOUNTER — Telehealth: Payer: Self-pay | Admitting: Skilled Nursing Facility1

## 2022-08-12 NOTE — Telephone Encounter (Signed)
Called to check in on pts status.  Pt states getting in fluids is tough.   Pt states she feels nausea a lot. Pt states she feels a ball is stopping it. Pt wonders if the opening is closed off.   Pt states she got a burst of energy after getting rehydrated. Pt states yesterday her teeth and jaw were hurting like she had been chewing on a bone.   Pt states she has not had any vomited but gest nausea when she attempts to drink stating it does go down she just feels nausea.  Pt states she feels the discomfort in her chest.   Pt states she has been taking the antacid daily stating she is nausea until she takes it so makes sure to take it daily  Pt states her last bowel movement was Tuesday which was her first since surgery which was burning diarrhea   Dietitian advised: Log everything you drink and the ounces as well Download Hydrocoach to log your fluid Take half the dose of the milk of magnesia since you felt it was too much; take it daily; do not take it if it gives you watery/fiery stools Use the Minutemaid with protein powder to reach your 60 grams per day (this will be another 30 ounces) Get your fluid in with broth and herbal tea aiming for 30 ounces per day Every 10-20 minutes try to sip something  Try alkaline water pH 8.8 Avoid regular water for now Avoid creating volume with your breast milk

## 2022-08-13 ENCOUNTER — Telehealth (HOSPITAL_COMMUNITY): Payer: Self-pay | Admitting: *Deleted

## 2022-08-13 ENCOUNTER — Ambulatory Visit (INDEPENDENT_AMBULATORY_CARE_PROVIDER_SITE_OTHER): Payer: No Typology Code available for payment source

## 2022-08-13 ENCOUNTER — Other Ambulatory Visit: Payer: Self-pay | Admitting: General Surgery

## 2022-08-13 ENCOUNTER — Other Ambulatory Visit (HOSPITAL_COMMUNITY): Payer: Self-pay | Admitting: General Surgery

## 2022-08-13 VITALS — BP 128/86 | HR 92 | Temp 97.8°F | Resp 16 | Ht 59.0 in | Wt 233.4 lb

## 2022-08-13 DIAGNOSIS — Z8669 Personal history of other diseases of the nervous system and sense organs: Secondary | ICD-10-CM

## 2022-08-13 DIAGNOSIS — R110 Nausea: Secondary | ICD-10-CM

## 2022-08-13 DIAGNOSIS — E860 Dehydration: Secondary | ICD-10-CM | POA: Diagnosis not present

## 2022-08-13 DIAGNOSIS — Z531 Procedure and treatment not carried out because of patient's decision for reasons of belief and group pressure: Secondary | ICD-10-CM

## 2022-08-13 DIAGNOSIS — Z9884 Bariatric surgery status: Secondary | ICD-10-CM

## 2022-08-13 MED ORDER — SODIUM CHLORIDE 0.9 % IV BOLUS
2000.0000 mL | Freq: Once | INTRAVENOUS | Status: AC
  Administered 2022-08-13: 2000 mL via INTRAVENOUS
  Filled 2022-08-13: qty 2000

## 2022-08-13 MED ORDER — ACETAMINOPHEN 325 MG PO TABS
650.0000 mg | ORAL_TABLET | Freq: Once | ORAL | Status: AC
  Administered 2022-08-13: 650 mg via ORAL
  Filled 2022-08-13: qty 2

## 2022-08-13 NOTE — Telephone Encounter (Signed)
Prior attempt was made to contact the pt and inform of infusion appointment today.  Call was successful to inform pt spouse of appointment for today; spouse was receptive and stated he would get in touch with the pt.

## 2022-08-13 NOTE — Telephone Encounter (Signed)
An attempt was made to contact the pt and inform her of an appointment for today; call went to voicemail. Message was sent to pt phone

## 2022-08-13 NOTE — Progress Notes (Signed)
Diagnosis: Dehydration  Provider:  Marshell Garfinkel MD  Procedure: Infusion  IV Type: Peripheral, IV Location: L Antecubital  Normal Saline, Dose: 2031ml  Infusion Start Time: 1975  Infusion Stop Time: 8832  Post Infusion IV Care: Patient Discharged  Discharge: Condition: Good, Destination: Home . AVS provided to patient.   Performed by:  Paul Dykes, RN

## 2022-08-13 NOTE — Telephone Encounter (Signed)
Pt responded to contact attempt and reported that she was at the infusion clinic on 08/13/2022   Thank you,  Calton Dach, RN, MSN Bariatric Nurse Coordinator [phone] (office)

## 2022-08-14 ENCOUNTER — Telehealth (HOSPITAL_COMMUNITY): Payer: Self-pay | Admitting: *Deleted

## 2022-08-14 NOTE — Telephone Encounter (Addendum)
Called to f/u with pt per MD request.  Pt states that she feels "much better" and is able to tolerate the abdominal/incisional discomfort.  Pt last took oxycodone last night before bed. Pt shared that she has spoken with the nutritionist and is now taking the Miralax daily.  Last BM 08/13/22. Pt denies any N/V/D. Received IVF yesterday.  Has been able to tolerate fluids.  Pt was able to meet 26fl oz and 60g protein goal yesterday. Pt states that she has already consumed 69fl oz today including 60g of protein.  Pt states that she is aware of the order for the Upper GI, but is "unsure if she needs it".  Instructed pt to f/u with MD's office to further discuss.  She has not scheduled an appointment.

## 2022-08-18 ENCOUNTER — Encounter: Payer: No Typology Code available for payment source | Attending: General Surgery | Admitting: Dietician

## 2022-08-18 ENCOUNTER — Ambulatory Visit (HOSPITAL_COMMUNITY)
Admission: RE | Admit: 2022-08-18 | Discharge: 2022-08-18 | Disposition: A | Discharge status: Home / Self Care | Payer: No Typology Code available for payment source | Source: Ambulatory Visit | Attending: General Surgery | Admitting: General Surgery

## 2022-08-18 ENCOUNTER — Encounter: Payer: Self-pay | Admitting: Dietician

## 2022-08-18 VITALS — Ht 59.0 in | Wt 232.3 lb

## 2022-08-18 DIAGNOSIS — E669 Obesity, unspecified: Secondary | ICD-10-CM | POA: Insufficient documentation

## 2022-08-18 DIAGNOSIS — R110 Nausea: Secondary | ICD-10-CM

## 2022-08-18 DIAGNOSIS — Z9884 Bariatric surgery status: Secondary | ICD-10-CM | POA: Insufficient documentation

## 2022-08-18 DIAGNOSIS — K219 Gastro-esophageal reflux disease without esophagitis: Secondary | ICD-10-CM | POA: Diagnosis not present

## 2022-08-18 NOTE — Progress Notes (Signed)
2 Week Post-Operative Nutrition Class   Patient was seen on 08/18/2022 for Post-Operative Nutrition education at [facility]    Surgery date: 08/04/2021 Surgery type: sleeve  Anthropometrics  Start weight at [facility]: 257.3 lbs (date: 11/12/2021)  Height: 59" Weight today: 232.3 lbs BMI: 46.92 kg/m2     Clinical  Medical hx: N/A Medications: N/A  Labs: none update in EMR Notable signs/symptoms: migraines: sates she believes them to be work related stating she has them every day Any previous deficiencies? No Bowel Habits: Every day to every other day no complaints Pt not tolerating full/clear liquids. Pt states the only fluid she tolerates is zero minute maid. Pt states she had an endoscopy done yesterday   Body Composition Scale 08/18/2022  Current Body Weight 232.3  Total Body Fat % 45.8  Visceral Fat 15  Fat-Free Mass % 54.1   Total Body Water % 41.5  Muscle-Mass lbs 28.9  BMI 47.0  Body Fat Displacement          Torso  lbs 65.9         Left Leg  lbs 13.1         Right Leg  lbs 13.1         Left Arm  lbs 6.5         Right Arm   lbs 6.5      The following the learning objectives were met by the patient during this course: Identifies Phase 3 (Soft, High Proteins) Dietary Goals and will begin from 2 weeks post-operatively to 2 months post-operatively Identifies appropriate sources of fluids and proteins  Identifies appropriate fat sources and healthy verses unhealthy fat types   States protein recommendations and appropriate sources post-operatively Identifies the need for appropriate texture modifications, mastication, and bite sizes when consuming solids Identifies appropriate fat consumption and sources Identifies appropriate multivitamin and calcium sources post-operatively Describes the need for physical activity post-operatively and will follow MD recommendations States when to call healthcare provider regarding medication questions  or post-operative complications Pt encouraged to advance to this phase as tolerated   Handouts given during class include: Soft Prepped Plan Advancement Guide   Follow-Up Plan: Dietitian will call pt in 9 days Patient will follow-up at [facility] in 2.5 months for 3 month post-op nutrition visit for diet advancement per MD.

## 2022-08-20 ENCOUNTER — Telehealth: Payer: Self-pay | Admitting: Dietician

## 2022-08-20 ENCOUNTER — Other Ambulatory Visit (HOSPITAL_COMMUNITY): Payer: Self-pay | Admitting: General Surgery

## 2022-08-20 DIAGNOSIS — R110 Nausea: Secondary | ICD-10-CM

## 2022-08-20 DIAGNOSIS — Z9884 Bariatric surgery status: Secondary | ICD-10-CM

## 2022-08-20 NOTE — Telephone Encounter (Signed)
RD called pt to verify fluid intake once starting soft, solid proteins 2 week post-bariatric surgery.   Daily Fluid intake: 64 oz/day (two bottles of water, Gatorade and more water) Daily Protein intake: supplementing with protein shakes but still a little short of 60 grams per day. Bowel Habits: Murelax helps  Concerns/issues:  pt states she still is getting cramps when eating, stating she has to take a break. Pt states she is adjusting

## 2022-08-27 ENCOUNTER — Other Ambulatory Visit: Payer: Self-pay | Admitting: General Surgery

## 2022-08-27 ENCOUNTER — Telehealth: Payer: Self-pay | Admitting: Skilled Nursing Facility1

## 2022-08-27 NOTE — Telephone Encounter (Signed)
Called pt to check on her progression.   Pt states she was always bloated stating it was from her protein shake which is the one she likes the taste the most. Pt states she is only able to get in 3-4 bottles of water which is not enough for her breast feeding stating she cannot stop breastfeeding. Pt states she is not going to force him off and was never planning on stopping after surgery.   Pt states she is just hoping as the weeks go on she will be able to get in enough of what she needs so she will just go with that.   Pt states she gets dry headaches.

## 2022-08-28 ENCOUNTER — Non-Acute Institutional Stay (HOSPITAL_COMMUNITY)
Admission: RE | Admit: 2022-08-28 | Discharge: 2022-08-28 | Disposition: A | Discharge status: Home / Self Care | Payer: No Typology Code available for payment source | Source: Ambulatory Visit | Attending: Internal Medicine | Admitting: General Surgery

## 2022-08-28 DIAGNOSIS — E860 Dehydration: Secondary | ICD-10-CM | POA: Diagnosis present

## 2022-08-28 MED ORDER — SODIUM CHLORIDE 0.9 % IV BOLUS
2000.0000 mL | Freq: Once | INTRAVENOUS | Status: AC
  Administered 2022-08-28: 2000 mL via INTRAVENOUS

## 2022-08-28 NOTE — Progress Notes (Signed)
PATIENT CARE CENTER NOTE   Diagnosis: Dehydration    Provider: [name], MD   Procedure: IV Fluid Bolus   Note: Patient received 2000 mL bolus of 0.9% NS via PIV. Bolus given over 2 hours. Patient tolerated well. Vital signs stable. AVS offered. Patient alert, oriented and ambulatory at discharge.

## 2022-10-05 ENCOUNTER — Ambulatory Visit: Payer: No Typology Code available for payment source | Admitting: Obstetrics and Gynecology

## 2022-10-15 ENCOUNTER — Ambulatory Visit: Payer: No Typology Code available for payment source | Admitting: Podiatry

## 2022-10-23 ENCOUNTER — Ambulatory Visit
Admission: RE | Admit: 2022-10-23 | Discharge: 2022-10-23 | Disposition: A | Discharge status: Home / Self Care | Payer: No Typology Code available for payment source | Source: Ambulatory Visit | Attending: Physician Assistant | Admitting: Physician Assistant

## 2022-10-23 ENCOUNTER — Other Ambulatory Visit: Payer: Self-pay | Admitting: Physician Assistant

## 2022-10-23 DIAGNOSIS — N6310 Unspecified lump in the right breast, unspecified quadrant: Secondary | ICD-10-CM

## 2022-10-29 ENCOUNTER — Encounter (HOSPITAL_BASED_OUTPATIENT_CLINIC_OR_DEPARTMENT_OTHER): Payer: Self-pay

## 2022-10-29 DIAGNOSIS — G4733 Obstructive sleep apnea (adult) (pediatric): Secondary | ICD-10-CM

## 2022-11-03 ENCOUNTER — Encounter: Payer: Self-pay | Admitting: Dietician

## 2022-11-03 ENCOUNTER — Encounter: Payer: No Typology Code available for payment source | Attending: General Surgery | Admitting: Dietician

## 2022-11-03 VITALS — Ht 59.0 in | Wt 212.6 lb

## 2022-11-03 DIAGNOSIS — E669 Obesity, unspecified: Secondary | ICD-10-CM | POA: Insufficient documentation

## 2022-11-03 NOTE — Progress Notes (Signed)
Bariatric Nutrition Follow-Up Visit Medical Nutrition Therapy  Appt Start Time: 4:50   End Time: 5:30  Surgery date: 08/04/2021 Surgery type: sleeve  NUTRITION ASSESSMENT  Anthropometrics  Start weight at [facility]: 257.3 lbs (date: 11/12/2021)  Height: 59" Weight today: 212.6 lbs BMI: 42.94 kg/m2     Clinical  Medical hx: N/A Medications: N/A  Labs: none update in EMR Notable signs/symptoms: migraines: sates she believes them to be work related stating she has them every day Any previous deficiencies? No Bowel Habits: Every day to every other day no complaints Pt not tolerating full/clear liquids. Pt states the only fluid she tolerates is zero minute maid. Pt states she had an endoscopy done yesterday   Body Composition Scale 08/18/2022 11/03/2022  Current Body Weight 232.3 212.6  Total Body Fat % 45.8 43.7  Visceral Fat 15 13  Fat-Free Mass % 54.1 56.2   Total Body Water % 41.5 42.6  Muscle-Mass lbs 28.9 28.7  BMI 47.0 43.0  Body Fat Displacement           Torso  lbs 65.9 57.5         Left Leg  lbs 13.1 11.5         Right Leg  lbs 13.1 11.5         Left Arm  lbs 6.5 5.7         Right Arm   lbs 6.5 5.7     Lifestyle & Dietary Hx  Pt states she is not weighing herself for awhile, stating she did not want to weight today. Pt during visit stated that she wanted to use the body comp scale to track her protein. Pt states she is not tracking her fluids or protein. Pt states she is having a hard time swallowing plain water (stating it feels like it gets stuck), but mixes it with minute maid zero and it is fine. Pt states she is trying to get a membership to [employer]. Pt states she injured her toe, and it slowed down her physical activity.  Estimated daily fluid intake: 64 oz Estimated daily protein intake: pt states she has not been tracking Supplements: multivitamin and calcium Current average weekly physical activity: walking more intentionally, but not tracking   24-Hr  Dietary Recall First Meal: scrambled egg with white cheese Snack: sharcutery (meat, cheese, tomato, cucumber, crackers)  Second Meal: sharcutery (meat, cheese, tomato, cucumber, crackers)  Snack: sharcutery (meat, cheese, tomato, cucumber, crackers)   Third Meal: fish, cucumber, white rice or green banana or sweet potatos Snack: sharcutery (meat, cheese, tomato, cucumber, crackers)  Beverages: water, minute maid zero sugar (diluted)  Post-Op Goals/ Signs/ Symptoms Using straws: no Drinking while eating: no Chewing/swallowing difficulties: no Changes in vision: no Changes to mood/headaches: no Hair loss/changes to skin/nails: no Difficulty focusing/concentrating: no Sweating: no Limb weakness: no Dizziness/lightheadedness: no Palpitations: no  Carbonated/caffeinated beverages: no N/V/D/C/Gas: no Abdominal pain: no Dumping syndrome: no    NUTRITION DIAGNOSIS  Overweight/obesity ([place]-3.3) related to past poor dietary habits and physical inactivity as evidenced by completed bariatric surgery and following dietary guidelines for continued weight loss and healthy nutrition status.   NUTRITION INTERVENTION Nutrition counseling (C-1) and education (E-2) to facilitate bariatric surgery goals, including: Diet advancement to the Standard Prep Plan The importance of consuming adequate calories as well as certain nutrients daily due to the body's need for essential vitamins, minerals, and fats The importance of daily physical activity and to reach a goal of at least 150 minutes of moderate  to vigorous physical activity weekly (or as directed by their physician) due to benefits such as increased musculature and improved lab values The importance of intuitive eating specifically learning hunger-satiety cues and understanding the importance of learning a new body: The importance of mindful eating to avoid grazing behaviors  Purpose of protein: Every cell in your body has protein. Protein is  essential for the structure, function and regulation of tissues and organs within the body. Without protein enzymes and antibodies would not exist, and cells would lack storage, transportation, and messenger systems. According to [facility]. [employer], the body is made up of at least 10000 different proteins. Lack of protein can lead to growth failure in children, loss of muscle mass, decreased immune system function, and overall weakening of various organs in the body.  SearchEngineCritic.nl, DoubleProperty.com.cy, PokerProtocol.pl  Handouts Provided Include  Standard Prep Plan Advancement Guide  Learning Style & Readiness for Change Teaching method utilized: Visual & Auditory  Demonstrated degree of understanding via: Teach Back  Readiness Level: contemplative Barriers to learning/adherence to lifestyle change: adapting bariatric patterns to culture and previous habits  RD's Notes for Next Visit Assess adherence to pt chosen goals  MONITORING & EVALUATION Dietary intake, weekly physical activity, body weight.  Next Steps Patient is to follow-up in 3 months for 6 month post-op follow-up.

## 2022-11-11 ENCOUNTER — Ambulatory Visit (INDEPENDENT_AMBULATORY_CARE_PROVIDER_SITE_OTHER): Payer: No Typology Code available for payment source

## 2022-11-11 ENCOUNTER — Ambulatory Visit (INDEPENDENT_AMBULATORY_CARE_PROVIDER_SITE_OTHER): Payer: No Typology Code available for payment source | Admitting: Podiatry

## 2022-11-11 ENCOUNTER — Encounter: Payer: Self-pay | Admitting: Podiatry

## 2022-11-11 DIAGNOSIS — M7751 Other enthesopathy of right foot: Secondary | ICD-10-CM | POA: Diagnosis not present

## 2022-11-11 DIAGNOSIS — M2041 Other hammer toe(s) (acquired), right foot: Secondary | ICD-10-CM

## 2022-11-11 DIAGNOSIS — M79671 Pain in right foot: Secondary | ICD-10-CM

## 2022-11-11 DIAGNOSIS — M79672 Pain in left foot: Secondary | ICD-10-CM

## 2022-11-11 MED ORDER — TRIAMCINOLONE ACETONIDE 10 MG/ML IJ SUSP
10.0000 mg | Freq: Once | INTRAMUSCULAR | Status: AC
  Administered 2022-11-11: 10 mg

## 2022-11-11 MED ORDER — DICLOFENAC SODIUM 75 MG PO TBEC: 75.0000 mg | DELAYED_RELEASE_TABLET | Freq: Two times a day (BID) | ORAL | 2 refills | Status: DC

## 2022-11-12 ENCOUNTER — Other Ambulatory Visit: Payer: Self-pay | Admitting: Podiatry

## 2022-11-12 DIAGNOSIS — M79671 Pain in right foot: Secondary | ICD-10-CM

## 2022-11-12 DIAGNOSIS — M2041 Other hammer toe(s) (acquired), right foot: Secondary | ICD-10-CM

## 2022-11-12 DIAGNOSIS — M7751 Other enthesopathy of right foot: Secondary | ICD-10-CM

## 2022-11-12 NOTE — Progress Notes (Signed)
Subjective:   Patient ID: Colleen Fields, female   DOB: 32 y.o.   MRN: [id number]   HPI Patient presents stating she is getting quite a bit of forefoot pain right and that around a month ago she missed stepped and she put a lot of stress on the joint and it started after that.  Patient states it has not gotten better and she feels like the second toe has moved somewhat.  Patient does not smoke likes to be active   Review of Systems  All other systems reviewed and are negative.       Objective:  Physical Exam Vitals and nursing note reviewed.  Constitutional:      Appearance: She is well-developed.  Pulmonary:     Effort: Pulmonary effort is normal.  Musculoskeletal:        General: Normal range of motion.  Skin:    General: Skin is warm.  Neurological:     Mental Status: She is alert.     Neurovascular status intact muscle strength found to be adequate with patient found to have inflammation pain of the second digit right metatarsal phalangeal joint with medial and slight dorsal dislocation of the toe that occurred since the injury.  Good digital perfusion well-oriented     Assessment:  Inflammatory capsulitis of the second MPJ right with possibility for flexor plate injury secondary to previous injury     Plan:  H&P reviewed and discussed the possibility for flexor plate damage and at this point organ to try conservative but eventually may take digital stabilization with osteotomy.  At this point I went ahead did a forefoot block right I did sterile prep I aspirated the second MPJ I did noted to be a small bloody infiltrate indicating probable tear of the of the sleeping and I injected quarter cc dexamethasone Kenalog applied thick plantar padding to reduce pressure on the joint and reappoint to recheck  X-rays indicate medial dorsal dislocation second digit right mild in nature but will need to be tracked

## 2022-11-17 ENCOUNTER — Encounter: Payer: Self-pay | Admitting: Family Medicine

## 2022-11-17 ENCOUNTER — Ambulatory Visit: Payer: No Typology Code available for payment source | Admitting: Family Medicine

## 2022-11-17 VITALS — Wt 220.3 lb

## 2022-11-17 DIAGNOSIS — Z30432 Encounter for removal of intrauterine contraceptive device: Secondary | ICD-10-CM

## 2022-11-17 DIAGNOSIS — R300 Dysuria: Secondary | ICD-10-CM | POA: Diagnosis not present

## 2022-11-17 NOTE — Progress Notes (Signed)
    GYNECOLOGY OFFICE PROCEDURE NOTE  [name] is a 32 y.o. [phone] here for Paragard IUD removal. No GYN concerns.  Last pap smear was on 03/2020 and was normal.  IUD Removal  Patient identified, informed consent performed, consent signed.  Patient was placed in the dorsal lithotomy position, normal external genitalia was noted.  A speculum was placed in the patient's vagina, normal discharge was noted, no lesions. The cervix was visualized, no lesions, no abnormal discharge.  The strings of the IUD were grasped and pulled using ring forceps. The IUD was removed in its entirety. Patient tolerated the procedure well.    Patient will use NFP for contraception. Routine preventative health maintenance measures emphasized.   Reva Bores, MD 11/17/2022 5:14 PM

## 2022-11-18 ENCOUNTER — Other Ambulatory Visit: Payer: Self-pay

## 2022-11-18 DIAGNOSIS — R300 Dysuria: Secondary | ICD-10-CM

## 2022-11-18 NOTE — Addendum Note (Signed)
Addended byVidal Schwalbe on: 11/18/2022 08:37 AM   Modules accepted: Orders

## 2022-11-23 LAB — URINE CULTURE

## 2022-11-24 MED ORDER — AMOXICILLIN 500 MG PO CAPS: 500.0000 mg | ORAL_CAPSULE | Freq: Three times a day (TID) | ORAL | 0 refills | Status: AC

## 2022-11-24 NOTE — Addendum Note (Signed)
Addended by: Reva Bores on: 11/24/2022 07:54 AM   Modules accepted: Orders

## 2022-11-25 ENCOUNTER — Ambulatory Visit (INDEPENDENT_AMBULATORY_CARE_PROVIDER_SITE_OTHER): Payer: No Typology Code available for payment source | Admitting: Podiatry

## 2022-11-25 ENCOUNTER — Encounter: Payer: Self-pay | Admitting: Pulmonary Disease

## 2022-11-25 DIAGNOSIS — M7751 Other enthesopathy of right foot: Secondary | ICD-10-CM

## 2022-11-25 DIAGNOSIS — M2041 Other hammer toe(s) (acquired), right foot: Secondary | ICD-10-CM | POA: Diagnosis not present

## 2022-11-25 NOTE — Progress Notes (Signed)
Subjective:   Patient ID: Colleen Fields, female   DOB: 32 y.o.   MRN: [id number]   HPI Patient states he just started to get pain back again but she did have good improvement with the procedure done by [place]   ROS      Objective:  Physical Exam  Neurovascular status intact moderate inflammation second MPJ right with medial deviation of the digit but pain has improved by about 70%     Assessment:  Probability for flexor plate dislocation with inflammation of the second MPJ right foot     Plan:  Reviewed condition recommended the continuation of conservative care and cushion therapy and ultimately may require digital repositioning with shortening osteotomy and I explained this to her along with recovery.  We want to see how she does with continued conservative care she is encouraged to call with questions concerns moving forward

## 2023-07-12 ENCOUNTER — Encounter (HOSPITAL_COMMUNITY): Payer: Self-pay | Admitting: Emergency Medicine

## 2023-07-12 ENCOUNTER — Ambulatory Visit (HOSPITAL_COMMUNITY)
Admission: EM | Admit: 2023-07-12 | Discharge: 2023-07-12 | Disposition: A | Discharge status: Home / Self Care | Payer: No Typology Code available for payment source | Attending: Internal Medicine | Admitting: Internal Medicine

## 2023-07-12 DIAGNOSIS — J029 Acute pharyngitis, unspecified: Secondary | ICD-10-CM | POA: Insufficient documentation

## 2023-07-12 LAB — POCT RAPID STREP A (OFFICE): Rapid Strep A Screen: NEGATIVE

## 2023-07-12 NOTE — ED Triage Notes (Signed)
Pt c/o coughing up mucous and left side sore throat for over 2 weeks. Took Mucinex day and night and finished an entire bottle. Congestion has gotten better. Throat is still hurting.

## 2023-07-12 NOTE — Discharge Instructions (Signed)
Warm salt water gargle Continue using Flonase Humidifier and VapoRub use will help with your symptoms We will send the throat samples for culture.  Will call you if labs are abnormal Your lung exams are reassuring Please feel free to return to urgent care if you have worsening symptoms.

## 2023-07-12 NOTE — ED Provider Notes (Signed)
[facility]    CSN: [id number] Arrival date & time: 07/12/23  1448      History   Chief Complaint Chief Complaint  Patient presents with   Cough   Sore Throat    HPI Colleen Fields is a 32 y.o. female comes to the urgent care with 2-week history of cough, runny nose, nasal congestion and recently left-sided sore throat.  Runny nose and nasal congestion has improved.  Patient is complaining of sore throat mainly on the left side of the throat.  No fever or chills.  Is aggravated by swallowing.  No fever or chills.  No shortness of breath or wheezing.  No chest pain or chest pressure.  No dizziness, near-syncope or syncopal episodes.  No nausea or vomiting or diarrhea.  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   History of migraine 09/12/2020   Blood transfusion declined because patient is Jehovah's Witness 08/24/2020   BMI 45.0-49.9, adult (HCC) 08/21/2020   Obstructive sleep apnea on CPAP 02/28/2020   Anxiety and depression 12/09/2017    Past Surgical History:  Procedure Laterality Date   CESAREAN SECTION     LAPAROSCOPIC GASTRIC SLEEVE RESECTION N/A 08/04/2022   Procedure: LAPAROSCOPIC SLEEVE GASTRECTOMY;  Surgeon: Gaynelle Adu, MD;  Location: [facility];  Service: General;  Laterality: N/A;   NO PAST SURGERIES     UPPER GI ENDOSCOPY N/A 08/04/2022   Procedure: UPPER GI ENDOSCOPY;  Surgeon: Gaynelle Adu, MD;  Location: [facility];  Service: General;  Laterality: N/A;   WISDOM TOOTH  EXTRACTION      OB History     Gravida  3   Para  3   Term  3   Preterm  0   AB      Living  3      SAB      IAB      Ectopic      Multiple  0   Live Births  3            Home Medications    Prior to Admission medications   Medication Sig Start Date End Date Taking? Authorizing Provider  acetaminophen (TYLENOL) 500 MG tablet Take 1,000 mg by mouth every 6 (six) hours as needed for headache. Patient not taking: Reported on 11/17/2022    [provider]  amitriptyline (ELAVIL) 10 MG tablet Take by mouth. Patient not taking: Reported on 11/17/2022 10/28/22   [provider]  buPROPion (WELLBUTRIN) 75 MG tablet Take 75 mg by mouth daily. 11/04/22   [provider]  diclofenac (VOLTAREN) 75 MG EC tablet Take 1 tablet (75 mg total) by mouth 2 (two) times daily. Patient not taking: Reported on 11/17/2022 11/11/22   Lenn Sink, DPM  fluticasone [facility]) 50 MCG/ACT nasal spray Place into the nose. Patient not taking: Reported on 11/17/2022 10/15/22   [provider]  hydrOXYzine (ATARAX) 10 MG tablet Take 10-20  mg by mouth daily as needed. 11/04/22   [provider]  loratadine (CLARITIN) 10 MG tablet Take 10 mg by mouth daily. Patient not taking: Reported on 11/17/2022    [provider]  Olopatadine HCl 0.2 % SOLN Apply to eye. Patient not taking: Reported on 11/17/2022 10/15/22   [provider]  oxyCODONE (OXY IR/ROXICODONE) 5 MG immediate release tablet Take 1 tablet (5 mg total) by mouth every 6 (six) hours as needed for severe pain. Patient not taking: Reported on 11/17/2022 08/06/22   Gaynelle Adu, MD    Family History Family History  Problem Relation Age of Onset   Hypertension Mother    Bipolar disorder Father    Schizophrenia Father    Breast cancer Maternal Aunt    Sleep apnea Maternal Uncle     Social History Social History   Tobacco Use   Smoking status: Never   Smokeless tobacco: Never  Vaping  Use   Vaping status: Never Used  Substance Use Topics   Alcohol use: No   Drug use: Not Currently    Types: Marijuana    Comment: "hasn't smoked in forever"  only at age 11     Allergies   Latex   Review of Systems Review of Systems As per HPI  Physical Exam Triage Vital Signs ED Triage Vitals  Encounter Vitals Group     BP 07/12/23 1716 (!) 142/88     Systolic BP Percentile --      Diastolic BP Percentile --      Pulse Rate 07/12/23 1716 80     Resp 07/12/23 1716 17     Temp 07/12/23 1716 98.1 F (36.7 C)     Temp Source 07/12/23 1716 Oral     SpO2 07/12/23 1716 98 %     Weight --      Height --      Head Circumference --      Peak Flow --      Pain Score 07/12/23 1715 6     Pain Loc --      Pain Education --      Exclude from Growth Chart --    No data found.  Updated Vital Signs BP (!) 142/88   Pulse 80   Temp 98.1 F (36.7 C) (Oral)   Resp 17   LMP 07/01/2023   SpO2 98%   Visual Acuity Right Eye Distance:   Left Eye Distance:   Bilateral Distance:    Right Eye Near:   Left Eye Near:    Bilateral Near:     Physical Exam Vitals and nursing note reviewed.  Constitutional:      General: She is not in acute distress.    Appearance: She is well-developed. She is not ill-appearing.  HENT:     Mouth/Throat:     Mouth: Mucous membranes are moist. Mucous membranes are pale.     Pharynx: Posterior oropharyngeal erythema present. No oropharyngeal exudate.     Tonsils: No tonsillar exudate or tonsillar abscesses.  Cardiovascular:     Rate and Rhythm: Normal rate and regular rhythm.     Heart sounds: Normal heart sounds.  Pulmonary:     Effort: Pulmonary effort is normal.     Breath sounds: Normal breath sounds.  Musculoskeletal:     Cervical back: Normal range of motion and neck supple.  Lymphadenopathy:     Cervical: No cervical adenopathy.  Neurological:     Mental Status: She is alert.  UC Treatments / Results  Labs (all labs  ordered are listed, but only abnormal results are displayed) Labs Reviewed  CULTURE, GROUP A STREP [facility])  POCT RAPID STREP A (OFFICE)    EKG   Radiology No results found.  Procedures Procedures (including critical care time)  Medications Ordered in UC Medications - No data to display  Initial Impression / Assessment and Plan / UC Course  I have reviewed the triage vital signs and the nursing notes.  Pertinent labs & imaging results that were available during my care of the patient were reviewed by me and considered in my medical decision making (see chart for details).     1.  Viral pharyngitis: Warm salt water gargle Point-of-care strep is negative Throat cultures have been sent Tylenol ibuprofen as needed for pain Humidifier and VapoRub use recommended Chloraseptic throat spray as needed Return precautions given Final Clinical Impressions(s) / UC Diagnoses   Final diagnoses:  Viral pharyngitis     Discharge Instructions      Warm salt water gargle Continue using Flonase Humidifier and VapoRub use will help with your symptoms We will send the throat samples for culture.  Will call you if labs are abnormal Your lung exams are reassuring Please feel free to return to urgent care if you have worsening symptoms.   ED Prescriptions   None    PDMP not reviewed this encounter.   Merrilee Jansky, MD 07/12/23 [phone]

## 2023-07-15 LAB — CULTURE, GROUP A STREP (THRC)

## 2023-12-23 ENCOUNTER — Ambulatory Visit

## 2023-12-23 DIAGNOSIS — Z3201 Encounter for pregnancy test, result positive: Secondary | ICD-10-CM | POA: Diagnosis not present

## 2023-12-23 LAB — POCT PREGNANCY, URINE: Preg Test, Ur: POSITIVE — AB

## 2023-12-23 NOTE — Progress Notes (Signed)
 Pt left urine for walk in UPT resulting positive.  Pt reports that she has been having spotting when she wipes only.  Pt also reports mild pain in lower abdomen that feels like a period cramp.  Pt reports LMP 10/31/23 which makes her EDD 08/06/24 and 7w 4d today.  Medications and allergies reviewed.  Pt advised to start [facility] and a list of OB providers along with list of medications safe to take in pregnancy.   Pt verbalized understanding with no further questions.   Alyda Megna,RN  12/23/23

## 2023-12-23 NOTE — Patient Instructions (Addendum)
 Center for [facility] Healthcare Prenatal Care Providers          Center for [facility] Healthcare locations:  Hours may vary. Please call for an appointment   Center for [facility] Healthcare at [facility]                                                             [address], [address], [place], [place], [address] [phone]  Center for [facility] Healthcare at [facility] [address], [address], [place], [place], [address] [phone]  Center for [facility] Healthcare at [facility] [address], [address], [place], [place], [address] [phone]  Center for [facility] Healthcare at [facility]                                 [address], [place], [place], [address] [phone]  Center for [facility] Healthcare at [facility]                                    [address] [place], [place], [address] [phone]  Center for [facility] Healthcare at [facility] [address], [place], [place], [address]     Center for [employer] [facility] [employer] [facility]             [address] [place], [place] [address] [phone]  Safe Medications in Pregnancy   Acne:  Benzoyl Peroxide  Salicylic Acid   Backache/Headache:  Tylenol: 2 regular strength every 4 hours OR               2 Extra strength every 6 hours   Colds/Coughs/Allergies:  Benadryl (alcohol free) 25 mg every 6 hours as needed  Breath right strips  Claritin  Cepacol throat lozenges  Chloraseptic throat spray  Cold-Eeze- up to three times per day  Cough drops, alcohol free  Flonase (by prescription only)  Guaifenesin  Mucinex  Robitussin DM (plain only, alcohol free)  Saline nasal spray/drops  Sudafed (pseudoephedrine) & Actifed * use only after [redacted] weeks gestation and if you do not have high blood pressure  Tylenol  Vicks Vaporub  Zinc lozenges  Zyrtec   Constipation:  Colace  Ducolax suppositories  Fleet enema  Glycerin suppositories  Metamucil  Milk of  magnesia  Miralax  Senokot  Smooth move tea   Diarrhea:  Kaopectate  Imodium A-D   *NO pepto Bismol   Hemorrhoids:  Anusol  Anusol HC  Preparation H  Tucks   Indigestion:  Tums  Maalox  Mylanta  Zantac  Pepcid   Insomnia:  Benadryl (alcohol free) 25mg  every 6 hours as needed  Tylenol PM  Unisom, no Gelcaps   Leg Cramps:  Tums  MagGel   Nausea/Vomiting:  Bonine  Dramamine  Emetrol  Ginger extract  Sea bands  Meclizine  Nausea medication to take during pregnancy:  Unisom (doxylamine succinate 25 mg tablets) Take one tablet daily at bedtime. If symptoms are not adequately  controlled, the dose can be increased to a maximum recommended dose of two tablets daily (1/2 tablet in the morning, 1/2 tablet mid-afternoon and one at bedtime).  Vitamin B6 100mg  tablets. Take one tablet twice a day (up to 200 mg per day).   Skin Rashes:  Aveeno products  Benadryl cream or 25mg  every 6 hours as needed  Calamine Lotion  1% cortisone cream   Yeast infection:  Gyne-lotrimin 7  Monistat 7    **If taking multiple medications, please check labels to avoid duplicating the same active ingredients  **take medication as directed on the label  ** Do not exceed 4000 mg of tylenol in 24 hours  **Do not take medications that contain aspirin or ibuprofen 

## 2024-01-21 ENCOUNTER — Encounter (HOSPITAL_COMMUNITY): Payer: Self-pay | Admitting: Obstetrics and Gynecology

## 2024-01-21 ENCOUNTER — Inpatient Hospital Stay (HOSPITAL_COMMUNITY)
Admission: AD | Admit: 2024-01-21 | Discharge: 2024-01-22 | Disposition: A | Discharge status: Home / Self Care | Attending: Obstetrics and Gynecology | Admitting: Obstetrics and Gynecology

## 2024-01-21 DIAGNOSIS — O26891 Other specified pregnancy related conditions, first trimester: Secondary | ICD-10-CM | POA: Insufficient documentation

## 2024-01-21 DIAGNOSIS — R519 Headache, unspecified: Secondary | ICD-10-CM | POA: Diagnosis present

## 2024-01-21 DIAGNOSIS — G479 Sleep disorder, unspecified: Secondary | ICD-10-CM | POA: Diagnosis not present

## 2024-01-21 DIAGNOSIS — O10911 Unspecified pre-existing hypertension complicating pregnancy, first trimester: Secondary | ICD-10-CM | POA: Diagnosis not present

## 2024-01-21 DIAGNOSIS — Z3A11 11 weeks gestation of pregnancy: Secondary | ICD-10-CM | POA: Insufficient documentation

## 2024-01-21 DIAGNOSIS — O3680X0 Pregnancy with inconclusive fetal viability, not applicable or unspecified: Secondary | ICD-10-CM | POA: Insufficient documentation

## 2024-01-21 DIAGNOSIS — O99351 Diseases of the nervous system complicating pregnancy, first trimester: Secondary | ICD-10-CM | POA: Insufficient documentation

## 2024-01-21 DIAGNOSIS — O10011 Pre-existing essential hypertension complicating pregnancy, first trimester: Secondary | ICD-10-CM | POA: Diagnosis not present

## 2024-01-21 LAB — URINALYSIS, ROUTINE W REFLEX MICROSCOPIC
Bilirubin Urine: NEGATIVE
Glucose, UA: 50 mg/dL — AB
Hgb urine dipstick: NEGATIVE
Ketones, ur: 5 mg/dL — AB
Nitrite: NEGATIVE
Protein, ur: NEGATIVE mg/dL
Specific Gravity, Urine: 1.024 (ref 1.005–1.030)
pH: 5 (ref 5.0–8.0)

## 2024-01-21 NOTE — MAU Note (Addendum)
 MAU Triage Note:  .Colleen Fields is a 33 y.o. at Unknown here in MAU reporting: feels like she over did it today and didn't drink enough water . Reports headache, SOB at rest, fatigue, and HBP (150's/100's). Patient able to talk in full sentences without difficulty. Denies VB or LOF.   PIH Assessment: Headache present: Yes ;No treatment attempted Visual disturbances: Floaters RUQ pain/Epigastric: None Atypical edema: None Hx of HBP: h/o PP PEC in 2022; HBP 3-6 months after that resolved BP Medications: bASA   Patient complaint: High bp  Pain Score: 7  Pain Location: Head     Onset of complaint: today LMP: Patient's last menstrual period was 10/28/2023.  Vitals:   01/21/24 2130  BP: (!) 146/92  Pulse: (!) 103  Resp: 18  Temp: 98.5 F (36.9 C)  SpO2: 100%     Lab orders placed from triage: UA

## 2024-01-22 DIAGNOSIS — Z3A11 11 weeks gestation of pregnancy: Secondary | ICD-10-CM

## 2024-01-22 DIAGNOSIS — O10911 Unspecified pre-existing hypertension complicating pregnancy, first trimester: Secondary | ICD-10-CM

## 2024-01-22 DIAGNOSIS — O3680X0 Pregnancy with inconclusive fetal viability, not applicable or unspecified: Secondary | ICD-10-CM

## 2024-01-22 DIAGNOSIS — O26891 Other specified pregnancy related conditions, first trimester: Secondary | ICD-10-CM

## 2024-01-22 DIAGNOSIS — R519 Headache, unspecified: Secondary | ICD-10-CM

## 2024-01-22 DIAGNOSIS — G479 Sleep disorder, unspecified: Secondary | ICD-10-CM

## 2024-01-22 MED ORDER — NIFEDIPINE ER OSMOTIC RELEASE 30 MG PO TB24
30.0000 mg | ORAL_TABLET | Freq: Every day | ORAL | 5 refills | Status: DC
Start: 2024-01-22 — End: 2024-04-11

## 2024-01-27 NOTE — MAU Provider Note (Signed)
 Chief Complaint: Hypertension, Headache, Shortness of Breath, and Fatigue   None     SUBJECTIVE HPI: Colleen Fields is a 33 y.o. [phone] at [redacted]w[redacted]d by LMP who presents to maternity admissions reporting SOB, fatigue, headache, and elevated BP at home.  She reports overdoing it at home the last few days and has not been sleeping well. She denies vaginal bleeding, vaginal itching/burning, urinary symptoms, dizziness, n/v, or fever/chills.     HPI  Past Medical History:  Diagnosis Date   Anxiety    Chest pain    Depression    Dysrhythmia    palpitations   History of gestational diabetes mellitus 09/05/2017   [ ]  qyear a1c  09/2020: passed 2h post partum GTT   History of severe pre-eclampsia 09/02/2020   Postpartum readmission for MGSO4   Migraine    Obesity 07/22/2017   Pre-diabetes    Tachycardia    Past Surgical History:  Procedure Laterality Date   CESAREAN SECTION     LAPAROSCOPIC GASTRIC SLEEVE RESECTION N/A 08/04/2022   Procedure: LAPAROSCOPIC SLEEVE GASTRECTOMY;  Surgeon: Tanda Locus, MD;  Location: [facility];  Service: General;  Laterality: N/A;   NO PAST SURGERIES     UPPER GI ENDOSCOPY N/A 08/04/2022   Procedure: UPPER GI ENDOSCOPY;  Surgeon: Tanda Locus, MD;  Location: [facility];  Service: General;  Laterality: N/A;   WISDOM TOOTH EXTRACTION     Social History   Socioeconomic History   Marital status: Married    Spouse name: Not on file   Number of children: Not on file   Years of education: Not on file   Highest education level: Not on file  Occupational History   Not on file  Tobacco Use   Smoking status: Never   Smokeless tobacco: Never  Vaping Use   Vaping status: Never Used  Substance and Sexual Activity   Alcohol use: No   Drug use: Not Currently    Types: Marijuana    Comment: hasn't smoked in forever  only at age 26   Sexual activity: Yes    Birth control/protection: I.U.D.  Other Topics Concern   Not on file  Social History Narrative   Not  on file   Social Drivers of Health   Financial Resource Strain: Not on File (01/09/2022)   Received from [employer]    Financial Resource Strain: 0  Food Insecurity: Not on File (04/08/2023)   Received from [employer]    Food: 0  Transportation Needs: Not on File (01/09/2022)   Received from [employer] Needs    Transportation: 0  Physical Activity: Not on File (01/09/2022)   Received from [facility]   Physical Activity    Physical Activity: 0  Stress: Not on File (01/09/2022)   Received from [facility]   Stress    Stress: 0  Social Connections: Not on File (03/27/2023)   Received from [employer]   Social Connections    Connectedness: 0  Intimate Partner Violence: Not on file   No current facility-administered medications on file prior to encounter.   Current Outpatient Medications on File Prior to Encounter  Medication Sig Dispense Refill   acetaminophen  (TYLENOL ) 500 MG tablet Take 1,000 mg by mouth every 6 (six) hours as needed for headache.     fluticasone (FLONASE) 50 MCG/ACT nasal spray Place into the nose. (Patient not taking: Reported on 11/17/2022)     hydrOXYzine  (ATARAX ) 10 MG tablet Take 10-20  mg by mouth daily as needed.     loratadine (CLARITIN) 10 MG tablet Take 10 mg by mouth daily. (Patient not taking: Reported on 11/17/2022)     Prenatal Vit-Fe Fumarate-FA (PREPLUS) 27-1 MG TABS Take 1 tablet by mouth daily.     Allergies  Allergen Reactions   Latex Itching and Other (See Comments)    Only occurs when exposed over a long duration, no respiratory issues    ROS:  Review of Systems  Constitutional:  Negative for chills, fatigue and fever.  Respiratory:  Negative for shortness of breath.   Cardiovascular:  Negative for chest pain.  Genitourinary:  Negative for difficulty urinating, dysuria, flank pain, pelvic pain, vaginal bleeding, vaginal discharge and vaginal pain.  Neurological:  Positive for headaches. Negative for  dizziness.  Psychiatric/Behavioral: Negative.       I have reviewed patient's Past Medical Hx, Surgical Hx, Family Hx, Social Hx, medications and allergies.   Physical Exam   BP (!) 146/95   Pulse 80   Temp 98.5 F (36.9 C) (Oral)   Resp 18   Ht 4' 11 (1.499 m)   Wt 92.4 kg   LMP 10/28/2023   SpO2 100%   BMI 41.16 kg/m   Constitutional: Well-developed, well-nourished female in no acute distress.  HEART: normal rate, heart sounds, regular rhythm RESP: normal effort, lung sounds clear and equal bilaterally  GI: Abd soft, non-tender. Pos BS x 4 MS: Extremities nontender, no edema, normal ROM Neurologic: Alert and oriented x 4.  GU: Neg CVAT.  PELVIC EXAM: Deferred  LAB RESULTS No results found for this or any previous visit (from the past 24 hours).     IMAGING No results found.  MAU Management/MDM: Orders Placed This Encounter  Procedures   US  OB Comp Less 14 Wks   Urinalysis, Routine w reflex microscopic -Urine, Clean Catch   Discharge patient Discharge disposition: 01-Home or Self Care; Discharge patient date: 01/22/2024    Meds ordered this encounter  Medications   NIFEdipine  (PROCARDIA  XL) 30 MG 24 hr tablet    Sig: Take 1 tablet (30 mg total) by mouth daily.    Dispense:  30 tablet    Refill:  5    Supervising Provider:   ERIK KIETH BROCKS [id number]    Pt with normal lung sounds, O2 sat 100% on RA.  Offered h/a management, pt declined as h/a is improved in MAU without treatment.  Discussed HTN, and given elevated BP x 2 in MAU today, will initiated meds for [facility]. Treat h/a with Tylenol , also Ok to use a little caffeine  to see if headache improves.  Discussed good sleep hygiene and magnesium  supplements, bendadryl PRN if not effective for sleep.  Pt to f/u with early prenatal care as planned. Viability US  scheduled. Return precautions reviewed.     ASSESSMENT 1. 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 [redacted] weeks gestation of  pregnancy   3. Sleep disturbance   4. Headache in pregnancy, antepartum, first trimester   5. Pregnancy with uncertain fetal viability, single or unspecified fetus     PLAN Discharge home Allergies as of 01/22/2024       Reactions   Latex Itching, Other (See Comments)   Only occurs when exposed over a long duration, no respiratory issues        Medication List     STOP taking these medications    amitriptyline 10 MG tablet Commonly known as: ELAVIL   buPROPion 75  MG tablet Commonly known as: WELLBUTRIN   diclofenac  75 MG EC tablet Commonly known as: VOLTAREN    Olopatadine HCl 0.2 % Soln   oxyCODONE  5 MG immediate release tablet Commonly known as: Oxy IR/ROXICODONE        TAKE these medications    acetaminophen  500 MG tablet Commonly known as: TYLENOL  Take 1,000 mg by mouth every 6 (six) hours as needed for headache.   fluticasone 50 MCG/ACT nasal spray Commonly known as: FLONASE Place into the nose.   hydrOXYzine  10 MG tablet Commonly known as: ATARAX  Take 10-20 mg by mouth daily as needed.   loratadine 10 MG tablet Commonly known as: CLARITIN Take 10 mg by mouth daily.   NIFEdipine  30 MG 24 hr tablet Commonly known as: Procardia  XL Take 1 tablet (30 mg total) by mouth daily.   PrePLUS 27-1 MG Tabs Take 1 tablet by mouth daily.        Follow-up Information     Center for [facility] Follow up.   Specialty: Obstetrics and Gynecology Why: The office will call you with ultrasound and prenatal appointments. Contact information: [address] [place]  [address] [phone]        [facility] Follow up.   Specialty: Obstetrics and Gynecology Why: As needed for emergencies Contact information: [address] [place]  [address] [phone]                Olam Boards Certified Nurse-Midwife 01/27/2024  11:41 PM

## 2024-01-28 ENCOUNTER — Ambulatory Visit (HOSPITAL_COMMUNITY)
Admission: RE | Admit: 2024-01-28 | Discharge: 2024-01-28 | Disposition: A | Discharge status: Home / Self Care | Source: Ambulatory Visit | Attending: Advanced Practice Midwife | Admitting: Advanced Practice Midwife

## 2024-01-28 DIAGNOSIS — O3680X0 Pregnancy with inconclusive fetal viability, not applicable or unspecified: Secondary | ICD-10-CM | POA: Insufficient documentation

## 2024-01-29 ENCOUNTER — Ambulatory Visit: Payer: Self-pay | Admitting: Advanced Practice Midwife

## 2024-02-09 ENCOUNTER — Telehealth: Payer: Self-pay | Admitting: *Deleted

## 2024-02-09 DIAGNOSIS — O10912 Unspecified pre-existing hypertension complicating pregnancy, second trimester: Secondary | ICD-10-CM | POA: Insufficient documentation

## 2024-02-09 DIAGNOSIS — Z8759 Personal history of other complications of pregnancy, childbirth and the puerperium: Secondary | ICD-10-CM

## 2024-02-09 DIAGNOSIS — O0990 Supervision of high risk pregnancy, unspecified, unspecified trimester: Secondary | ICD-10-CM

## 2024-02-09 DIAGNOSIS — Z9884 Bariatric surgery status: Secondary | ICD-10-CM

## 2024-02-09 DIAGNOSIS — O99210 Obesity complicating pregnancy, unspecified trimester: Secondary | ICD-10-CM

## 2024-02-09 DIAGNOSIS — O169 Unspecified maternal hypertension, unspecified trimester: Secondary | ICD-10-CM | POA: Diagnosis not present

## 2024-02-09 DIAGNOSIS — Z3A14 14 weeks gestation of pregnancy: Secondary | ICD-10-CM

## 2024-02-09 DIAGNOSIS — O0992 Supervision of high risk pregnancy, unspecified, second trimester: Secondary | ICD-10-CM | POA: Insufficient documentation

## 2024-02-09 DIAGNOSIS — O09299 Supervision of pregnancy with other poor reproductive or obstetric history, unspecified trimester: Secondary | ICD-10-CM

## 2024-02-09 DIAGNOSIS — O10919 Unspecified pre-existing hypertension complicating pregnancy, unspecified trimester: Secondary | ICD-10-CM | POA: Insufficient documentation

## 2024-02-09 DIAGNOSIS — Z8632 Personal history of gestational diabetes: Secondary | ICD-10-CM

## 2024-02-09 MED ORDER — BLOOD PRESSURE KIT DEVI: 1.0000 | 0 refills | Status: DC | PRN

## 2024-02-09 NOTE — Progress Notes (Signed)
 New OB Intake  I connected with Colleen Fields  on 02/09/24 at  8:15 AM EDT by MyChart Video Visit and verified that I am speaking with the correct person using two identifiers. Nurse is located at [facility] and pt is located at home.  I discussed the limitations, risks, security and privacy concerns of performing an evaluation and management service by telephone and the availability of in person appointments. I also discussed with the patient that there may be a patient responsible charge related to this service. The patient expressed understanding and agreed to proceed.  I explained I am completing New OB Intake today. We discussed EDD of 08/11/24 based on US . Also reports LMP was 10/28/23 not 10/31/23 Pt is [id number]. I reviewed her allergies, medications and Medical/Surgical/OB history.    Patient Active Problem List   Diagnosis Date Noted   Supervision of high risk pregnancy, antepartum 02/09/2024   Hypertension in pregnancy, antepartum 02/09/2024   Obesity in pregnancy 02/09/2024   History of gestational diabetes mellitus (GDM) in prior pregnancy, currently pregnant 02/09/2024   S/P laparoscopic sleeve gastrectomy 08/04/2022   History of migraine 09/12/2020   History of severe pre-eclampsia 09/02/2020   Blood transfusion declined because patient is Jehovah's Witness 08/24/2020   BMI 45.0-49.9, adult (HCC) 08/21/2020   Obstructive sleep apnea on CPAP 02/28/2020   Anxiety and depression 12/09/2017     Concerns addressed today  Delivery Plans Plans to deliver at [facility]. Discussed the nature of our practice with multiple providers including residents and students. Due to the size of the practice, the delivering provider may not be the same as those providing prenatal care.   Patient  is not a candidate for  water  birth.  MyChart/Babyscripts MyChart access verified. I explained pt will have some visits in office and some virtually. Babyscripts instructions given and order placed.    Blood Pressure Cuff/Weight Scale Blood pressure cuff ordered to her pharmacy since she has [employer], and understands may not be covered and we recommend she buy one.  Explained after first prenatal appt pt will check weekly and document in Babyscripts. Patient does have weight scale.  Anatomy US  Explained first scheduled US  will be around 19 weeks. Anatomy US  scheduled for 03/27/24  at 1000.  Is patient a CenteringPregnancy candidate?  Not a candidate due to [facility], medication controlled, hx sleeve gastric surgery,. Hx GDM.  If accepted,    Is patient a Mom+Baby Combined Care candidate?  Not a candidate    Is patient a candidate for Babyscripts Optimization? No, due to newly dx CHTN.   First visit review I reviewed new OB appt with patient. Explained pt will be seen by Dr. Izell at first visit. Discussed [place] genetic screening with patient. She declines [employer] and [employer].. Routine prenatal labs needed at new ob visit.    Last Pap Diagnosis  Date Value Ref Range Status  03/21/2020   Final   - Negative for intraepithelial lesion or malignancy (NILM)    Rock Skip PEAK 02/09/2024  9:51 AM

## 2024-02-17 ENCOUNTER — Other Ambulatory Visit: Payer: Self-pay

## 2024-02-17 ENCOUNTER — Encounter: Payer: Self-pay | Admitting: Obstetrics and Gynecology

## 2024-02-17 ENCOUNTER — Ambulatory Visit: Admitting: Obstetrics and Gynecology

## 2024-02-17 VITALS — BP 104/73 | HR 103 | Wt 202.6 lb

## 2024-02-17 DIAGNOSIS — Z3A14 14 weeks gestation of pregnancy: Secondary | ICD-10-CM | POA: Diagnosis not present

## 2024-02-17 DIAGNOSIS — O99210 Obesity complicating pregnancy, unspecified trimester: Secondary | ICD-10-CM

## 2024-02-17 DIAGNOSIS — O0990 Supervision of high risk pregnancy, unspecified, unspecified trimester: Secondary | ICD-10-CM

## 2024-02-17 DIAGNOSIS — F419 Anxiety disorder, unspecified: Secondary | ICD-10-CM

## 2024-02-17 DIAGNOSIS — Z9884 Bariatric surgery status: Secondary | ICD-10-CM

## 2024-02-17 DIAGNOSIS — Z6841 Body Mass Index (BMI) 40.0 and over, adult: Secondary | ICD-10-CM

## 2024-02-17 DIAGNOSIS — O10919 Unspecified pre-existing hypertension complicating pregnancy, unspecified trimester: Secondary | ICD-10-CM

## 2024-02-17 DIAGNOSIS — F32A Depression, unspecified: Secondary | ICD-10-CM

## 2024-02-17 DIAGNOSIS — O0992 Supervision of high risk pregnancy, unspecified, second trimester: Secondary | ICD-10-CM

## 2024-02-17 DIAGNOSIS — O10912 Unspecified pre-existing hypertension complicating pregnancy, second trimester: Secondary | ICD-10-CM

## 2024-02-17 DIAGNOSIS — G4733 Obstructive sleep apnea (adult) (pediatric): Secondary | ICD-10-CM

## 2024-02-17 DIAGNOSIS — O99212 Obesity complicating pregnancy, second trimester: Secondary | ICD-10-CM

## 2024-02-17 DIAGNOSIS — O09292 Supervision of pregnancy with other poor reproductive or obstetric history, second trimester: Secondary | ICD-10-CM

## 2024-02-17 DIAGNOSIS — Z789 Other specified health status: Secondary | ICD-10-CM

## 2024-02-17 DIAGNOSIS — Z98891 History of uterine scar from previous surgery: Secondary | ICD-10-CM

## 2024-02-17 DIAGNOSIS — Z8669 Personal history of other diseases of the nervous system and sense organs: Secondary | ICD-10-CM

## 2024-02-17 DIAGNOSIS — Z8759 Personal history of other complications of pregnancy, childbirth and the puerperium: Secondary | ICD-10-CM

## 2024-02-17 DIAGNOSIS — O09299 Supervision of pregnancy with other poor reproductive or obstetric history, unspecified trimester: Secondary | ICD-10-CM

## 2024-02-17 NOTE — Progress Notes (Signed)
 Patient here to establish prenatal care.  She stated that she does not want genetic screening.  Reports good movement from baby denies any vaginal bleeding or abnormal discharge.

## 2024-02-17 NOTE — Addendum Note (Signed)
 Addended by: Alechia Lezama O on: 02/17/2024 04:48 PM   Modules accepted: Orders

## 2024-02-17 NOTE — Progress Notes (Signed)
 New OB Note  02/17/2024   Clinic: [facility] for [facility]  Chief Complaint: new OB  Transfer of Care Patient: no  History of Present Illness: Ms. Batool Majid is a 33 y.o. [id number] at 14/6 weeks (EDC 1/30, based on 12wk u/s) Patient's last menstrual period was 10/28/2023.  Pregnancy complicated by has Anxiety and depression; History of VBAC; Obstructive sleep apnea on CPAP; BMI 45.0-49.9, adult (HCC); Blood product declined; History of severe pre-eclampsia; History of migraine; S/P laparoscopic sleeve gastrectomy; Supervision of high risk pregnancy, antepartum; Chronic hypertension during pregnancy, antepartum; Obesity in pregnancy; and History of gestational diabetes mellitus (GDM) in prior pregnancy, currently pregnant on their problem list.   Patient doing well an no OB s/s.   ROS: A 12-point review of systems was performed and negative, except as stated in the above HPI.  OBGYN History: As per HPI. OB History  Gravida Para Term Preterm AB Living  4 3 3  0  3  SAB IAB Ectopic Multiple Live Births     0 3    # Outcome Date GA Lbr Len/2nd Weight Sex Type Anes PTL Lv  4 Current           3 Term 08/24/20 [redacted]w[redacted]d 15:02 / 00:03 6 lb 7.9 oz (2.946 kg) M Vag-Spont EPI  LIV     Birth Comments: GDM, IOL due to GDM  2 Term 01/23/18 [redacted]w[redacted]d / 00:17 7 lb 1.1 oz (3.206 kg) M VBAC EPI  LIV     Birth Comments: GDM  1 Term 12/03/09 [redacted]w[redacted]d  6 lb 12 oz (3.062 kg) F CS-LTranv Spinal  LIV     Birth Comments: wnl; IOL due decel in FHR at MD office, then c/s due to [facility]     Complications: Failure to Progress in First Stage   History of pap smears: Yes. Last pap smear 2021 and results were cytology and hpv negative   Past Medical History: Past Medical History:  Diagnosis Date   Anxiety    Chest pain    Complication of anesthesia    1st eipidural -didn't get relief from spinal- had severe pain during procedure, couldn't see   Depression    Dysrhythmia    palpitations    History of gestational diabetes mellitus 09/05/2017   [ ]  qyear a1c  09/2020: passed 2h post partum GTT   History of severe pre-eclampsia 09/02/2020   Postpartum readmission for MGSO4   Migraine    Obesity 07/22/2017   Pre-diabetes    Tachycardia     Past Surgical History: Past Surgical History:  Procedure Laterality Date   CESAREAN SECTION     LAPAROSCOPIC GASTRIC SLEEVE RESECTION N/A 08/04/2022   Procedure: LAPAROSCOPIC SLEEVE GASTRECTOMY;  Surgeon: Tanda Locus, MD;  Location: [facility];  Service: General;  Laterality: N/A;   UPPER GI ENDOSCOPY N/A 08/04/2022   Procedure: UPPER GI ENDOSCOPY;  Surgeon: Tanda Locus, MD;  Location: [facility];  Service: General;  Laterality: N/A;   WISDOM TOOTH EXTRACTION      Family History:  Family History  Problem Relation Age of Onset   Diabetes Mother    Hypertension Mother    Bipolar disorder Father    Schizophrenia Father    Hypertension Sister    Fibroids Sister    Breast cancer Maternal Aunt    Sleep apnea Maternal Uncle     Social History:  Social History   Socioeconomic History   Marital status: Married    Spouse name: Not on file  Number of children: Not on file   Years of education: Not on file   Highest education level: Not on file  Occupational History   Not on file  Tobacco Use   Smoking status: Never   Smokeless tobacco: Never  Vaping Use   Vaping status: Never Used  Substance and Sexual Activity   Alcohol use: Not Currently    Comment: occasionally   Drug use: Not Currently    Types: Marijuana    Comment: hasn't smoked in forever  only at age 58   Sexual activity: Yes    Birth control/protection: None  Other Topics Concern   Not on file  Social History Narrative   Not on file   Social Drivers of Health   Financial Resource Strain: Not on File (01/09/2022)   Received from [employer]    Financial Resource Strain: 0  Food Insecurity: Not on File (04/08/2023)   Received from [employer] [facility]    Food: 0  Transportation Needs: Not on File (01/09/2022)   Received from [employer] Needs    Transportation: 0  Physical Activity: Not on File (01/09/2022)   Received from [facility]   Physical Activity    Physical Activity: 0  Stress: Not on File (01/09/2022)   Received from [facility]   Stress    Stress: 0  Social Connections: Not on File (03/27/2023)   Received from [employer]   Social Connections    Connectedness: 0  Intimate Partner Violence: Not on file   Allergy: Allergies  Allergen Reactions   Latex Itching and Other (See Comments)    Only occurs when exposed over a long duration, no respiratory issues   Current Outpatient Medications: Prenatal vitamin @CMEDTAKING @  Physical Exam:   BP 104/73   Pulse (!) 103   Wt 202 lb 9.6 oz (91.9 kg)   LMP 10/28/2023   BMI 40.92 kg/m  Body mass index is 40.92 kg/m. Contractions: Not present Vag. Bleeding: None. FHTs: 150s  General appearance: Well nourished, well developed female in no acute distress.  Cardiovascular: S1, S2 normal, no murmur, rub or gallop, regular rate and rhythm Respiratory:  Clear to auscultation bilateral. Normal respiratory effort Abdomen: positive bowel sounds and no masses, hernias; diffusely non tender to palpation, non distended Neuro/Psych:  Normal mood and affect.  Skin:  Warm and dry.   Laboratory: none  Imaging:  No new imaging  Assessment: patient stable  Plan: 1. [redacted] weeks gestation of pregnancy (Primary) Anatomy u/s already scheduled - CBC/D/Plt+RPR+Rh+ABO+RubIgG...; Future - Vitamin D (25 hydroxy); Future - Hemoglobin A1c; Future - Vitamin B1; Future - Comp Met (CMET); Future - Protein / creatinine ratio, urine - Culture, OB Urine - GC/Chlamydia probe amp ([place])not at [facility] - TSH Rfx on Abnormal to Free T4; Future - AFP, Serum, Open Spina Bifida; Future  2. Supervision of high risk pregnancy, antepartum Recommend holding off on ASA given sleeve  procedure Declines genetics - CBC/D/Plt+RPR+Rh+ABO+RubIgG...; Future - Vitamin D (25 hydroxy); Future - Hemoglobin A1c; Future - Vitamin B1; Future - Comp Met (CMET); Future - Protein / creatinine ratio, urine - Culture, OB Urine - GC/Chlamydia probe amp ([place])not at [facility] - TSH Rfx on Abnormal to Free T4; Future - AFP, Serum, Open Spina Bifida; Future  3. S/P laparoscopic sleeve gastrectomy Weight hadn't yet flattened which is ideal. Will get baseline labs and serial scans during the pregnancy. Pt amenable to coming back tomorrow so can do afp.  -  CBC/D/Plt+RPR+Rh+ABO+RubIgG...; Future - Vitamin D (25 hydroxy); Future - Hemoglobin A1c; Future - Vitamin B1; Future - Comp Met (CMET); Future - Protein / creatinine ratio, urine - Culture, OB Urine - GC/Chlamydia probe amp ([place])not at [facility] - TSH Rfx on Abnormal to Free T4; Future - AFP, Serum, Open Spina Bifida; Future  4. Obstructive sleep apnea on CPAP Patient states doesn't have anymore  5. Obesity in pregnancy  6. BMI 40.0-44.9, adult (HCC)  7. History of VBAC  8. History of severe pre-eclampsia  9. Chronic hypertension during pregnancy, antepartum - CBC/D/Plt+RPR+Rh+ABO+RubIgG...; Future - Vitamin D (25 hydroxy); Future - Hemoglobin A1c; Future - Vitamin B1; Future - Comp Met (CMET); Future - Protein / creatinine ratio, urine - Culture, OB Urine - GC/Chlamydia probe amp ([place])not at [facility] - TSH Rfx on Abnormal to Free T4; Future - AFP, Serum, Open Spina Bifida; Future  10. History of gestational diabetes mellitus (GDM) in prior pregnancy, currently pregnant Early A1c today  11. Blood product declined Confirmed with patient  12. Anxiety and depression No issues  13. History of migraine Much improved. D/w her re: qday Mg  Problem list reviewed and updated.  Follow up in 4 weeks.  >50% of 35 min visit spent on counseling and coordination of care.  Return in 1 day (on 02/18/2024) for  lab only visit.  Future Appointments  Date Time Provider Department Center  03/15/2024 10:55 AM Zina Jerilynn LABOR, MD [facility]  03/27/2024 10:00 AM [facility] PROVIDER 1 [facility]  03/27/2024 10:30 AM [facility] US1 [facility]  04/11/2024 10:15 AM Zina Jerilynn LABOR, MD [facility]    Bebe Izell Overcast MD Attending Center for [facility])

## 2024-02-21 ENCOUNTER — Other Ambulatory Visit

## 2024-02-21 ENCOUNTER — Other Ambulatory Visit: Payer: Self-pay

## 2024-02-21 DIAGNOSIS — O10919 Unspecified pre-existing hypertension complicating pregnancy, unspecified trimester: Secondary | ICD-10-CM

## 2024-02-21 DIAGNOSIS — O0990 Supervision of high risk pregnancy, unspecified, unspecified trimester: Secondary | ICD-10-CM

## 2024-02-21 DIAGNOSIS — Z3A14 14 weeks gestation of pregnancy: Secondary | ICD-10-CM

## 2024-02-21 DIAGNOSIS — Z9884 Bariatric surgery status: Secondary | ICD-10-CM

## 2024-02-22 LAB — TSH RFX ON ABNORMAL TO FREE T4: TSH: 0.958 u[IU]/mL (ref 0.450–4.500)

## 2024-02-25 ENCOUNTER — Encounter (HOSPITAL_COMMUNITY): Payer: Self-pay | Admitting: *Deleted

## 2024-02-25 ENCOUNTER — Encounter: Payer: Self-pay | Admitting: Obstetrics and Gynecology

## 2024-02-26 LAB — HCV INTERPRETATION

## 2024-02-26 LAB — CBC/D/PLT+RPR+RH+ABO+RUBIGG...
Antibody Screen: NEGATIVE
Basophils Absolute: 0 x10E3/uL (ref 0.0–0.2)
Basos: 0 %
EOS (ABSOLUTE): 0.1 x10E3/uL (ref 0.0–0.4)
Eos: 2 %
HCV Ab: NONREACTIVE
HIV Screen 4th Generation wRfx: NONREACTIVE
Hematocrit: 30.5 % — ABNORMAL LOW (ref 34.0–46.6)
Hemoglobin: 10.2 g/dL — ABNORMAL LOW (ref 11.1–15.9)
Hepatitis B Surface Ag: NEGATIVE
Immature Grans (Abs): 0 x10E3/uL (ref 0.0–0.1)
Immature Granulocytes: 0 %
Lymphocytes Absolute: 1.4 x10E3/uL (ref 0.7–3.1)
Lymphs: 26 %
MCH: 30.8 pg (ref 26.6–33.0)
MCHC: 33.4 g/dL (ref 31.5–35.7)
MCV: 92 fL (ref 79–97)
Monocytes Absolute: 0.3 x10E3/uL (ref 0.1–0.9)
Monocytes: 6 %
Neutrophils Absolute: 3.5 x10E3/uL (ref 1.4–7.0)
Neutrophils: 65 %
Platelets: 256 x10E3/uL (ref 150–450)
RBC: 3.31 x10E6/uL — ABNORMAL LOW (ref 3.77–5.28)
RDW: 13.5 % (ref 11.7–15.4)
RPR Ser Ql: NONREACTIVE
Rh Factor: POSITIVE
Rubella Antibodies, IGG: 2.05 {index} (ref >0.99)
WBC: 5.3 x10E3/uL (ref 3.4–10.8)

## 2024-02-26 LAB — VITAMIN B1: Thiamine: 82.2 nmol/L (ref 66.5–200.0)

## 2024-02-26 LAB — AFP, SERUM, OPEN SPINA BIFIDA
AFP MoM: 0.56
AFP Value: 15.7 ng/mL
Gest. Age on Collection Date: 15.4 wk
Maternal Age At EDD: 34.5 a
OSBR Risk 1 IN: 10000
Test Results:: NEGATIVE
Weight: 202 [lb_av]

## 2024-02-26 LAB — COMPREHENSIVE METABOLIC PANEL WITH GFR
ALT: 11 IU/L (ref 0–32)
AST: 12 IU/L (ref 0–40)
Albumin: 3.6 g/dL — ABNORMAL LOW (ref 3.9–4.9)
Alkaline Phosphatase: 45 IU/L (ref 44–121)
BUN/Creatinine Ratio: 15 (ref 9–23)
BUN: 9 mg/dL (ref 6–20)
Bilirubin Total: <0.2 mg/dL (ref 0.0–1.2)
CO2: 19 mmol/L — ABNORMAL LOW (ref 20–29)
Calcium: 9.6 mg/dL (ref 8.7–10.2)
Chloride: 104 mmol/L (ref 96–106)
Creatinine, Ser: 0.61 mg/dL (ref 0.57–1.00)
Globulin, Total: 2.5 g/dL (ref 1.5–4.5)
Glucose: 85 mg/dL (ref 70–99)
Potassium: 4.1 mmol/L (ref 3.5–5.2)
Sodium: 136 mmol/L (ref 134–144)
Total Protein: 6.1 g/dL (ref 6.0–8.5)
eGFR: 120 mL/min/1.73 (ref >59)

## 2024-02-26 LAB — HEMOGLOBIN A1C
Est. average glucose Bld gHb Est-mCnc: 117 mg/dL
Hgb A1c MFr Bld: 5.7 % — ABNORMAL HIGH (ref 4.8–5.6)

## 2024-02-26 LAB — VITAMIN D 25 HYDROXY (VIT D DEFICIENCY, FRACTURES): Vit D, 25-Hydroxy: 21.6 ng/mL — ABNORMAL LOW (ref 30.0–100.0)

## 2024-02-28 ENCOUNTER — Ambulatory Visit: Payer: Self-pay | Admitting: Obstetrics and Gynecology

## 2024-02-28 DIAGNOSIS — O99012 Anemia complicating pregnancy, second trimester: Secondary | ICD-10-CM

## 2024-02-28 DIAGNOSIS — R7989 Other specified abnormal findings of blood chemistry: Secondary | ICD-10-CM | POA: Insufficient documentation

## 2024-02-28 DIAGNOSIS — O99810 Abnormal glucose complicating pregnancy: Secondary | ICD-10-CM | POA: Insufficient documentation

## 2024-02-28 MED ORDER — VITAMIN D (ERGOCALCIFEROL) 1.25 MG (50000 UNIT) PO CAPS: 50000.0000 [IU] | ORAL_CAPSULE | ORAL | 0 refills | Status: DC

## 2024-03-15 ENCOUNTER — Other Ambulatory Visit: Payer: Self-pay | Admitting: Lactation Services

## 2024-03-15 ENCOUNTER — Ambulatory Visit: Admitting: Obstetrics and Gynecology

## 2024-03-15 ENCOUNTER — Other Ambulatory Visit: Payer: Self-pay

## 2024-03-15 ENCOUNTER — Encounter: Payer: Self-pay | Admitting: Obstetrics and Gynecology

## 2024-03-15 VITALS — BP 111/77 | HR 94 | Wt 206.1 lb

## 2024-03-15 DIAGNOSIS — Z3A18 18 weeks gestation of pregnancy: Secondary | ICD-10-CM

## 2024-03-15 DIAGNOSIS — G4733 Obstructive sleep apnea (adult) (pediatric): Secondary | ICD-10-CM | POA: Diagnosis not present

## 2024-03-15 DIAGNOSIS — O10912 Unspecified pre-existing hypertension complicating pregnancy, second trimester: Secondary | ICD-10-CM | POA: Diagnosis not present

## 2024-03-15 DIAGNOSIS — O10919 Unspecified pre-existing hypertension complicating pregnancy, unspecified trimester: Secondary | ICD-10-CM

## 2024-03-15 DIAGNOSIS — Z6841 Body Mass Index (BMI) 40.0 and over, adult: Secondary | ICD-10-CM

## 2024-03-15 DIAGNOSIS — Z98891 History of uterine scar from previous surgery: Secondary | ICD-10-CM

## 2024-03-15 DIAGNOSIS — O09299 Supervision of pregnancy with other poor reproductive or obstetric history, unspecified trimester: Secondary | ICD-10-CM

## 2024-03-15 DIAGNOSIS — Z9884 Bariatric surgery status: Secondary | ICD-10-CM

## 2024-03-15 DIAGNOSIS — O09292 Supervision of pregnancy with other poor reproductive or obstetric history, second trimester: Secondary | ICD-10-CM

## 2024-03-15 DIAGNOSIS — O0990 Supervision of high risk pregnancy, unspecified, unspecified trimester: Secondary | ICD-10-CM

## 2024-03-15 DIAGNOSIS — Z8632 Personal history of gestational diabetes: Secondary | ICD-10-CM

## 2024-03-15 DIAGNOSIS — O0992 Supervision of high risk pregnancy, unspecified, second trimester: Secondary | ICD-10-CM

## 2024-03-15 MED ORDER — FERROUS SULFATE 324 MG PO TBEC: 324.0000 mg | DELAYED_RELEASE_TABLET | ORAL | 2 refills | Status: DC

## 2024-03-15 NOTE — Progress Notes (Signed)
 Pt states still having headaches, not taking anything for it.

## 2024-03-15 NOTE — Patient Instructions (Signed)
 Unisom  (doxylamine)  for insomnia  Excedrin tension for headache

## 2024-03-15 NOTE — Progress Notes (Signed)
   PRENATAL VISIT NOTE  Subjective:  Colleen Fields is a 33 y.o. [phone] at [redacted]w[redacted]d being seen today for ongoing prenatal care.  She is currently monitored for the following issues for this high-risk pregnancy and has History of cesarean delivery followed by successful vaginal birth (VBAC); Anxiety and depression; History of VBAC; Obstructive sleep apnea on CPAP; BMI 40.0-44.9, adult (HCC); Blood product declined; History of severe pre-eclampsia; History of migraine; S/P laparoscopic sleeve gastrectomy; Supervision of high risk pregnancy, antepartum; Chronic hypertension during pregnancy, antepartum; Obesity in pregnancy; History of gestational diabetes mellitus (GDM) in prior pregnancy, currently pregnant; Abnormal glucose affecting pregnancy; and Low vitamin D  level on their problem list.  Patient doing well with no acute concerns today. She reports intermittent headaches and insomnia.   Vag. Bleeding: None.  Movement: Present. Denies leaking of fluid.   The following portions of the patient's history were reviewed and updated as appropriate: allergies, current medications, past family history, past medical history, past social history, past surgical history and problem list. Problem list updated.  Objective:   Vitals:   03/15/24 1117  BP: 111/77  Pulse: 94  Weight: 206 lb 1.6 oz (93.5 kg)    Fetal Status: Fetal Heart Rate (bpm): 164   Movement: Present     General:  Alert, oriented and cooperative. Patient is in no acute distress.  Skin: Skin is warm and dry. No rash noted.   Cardiovascular: Normal heart rate noted  Respiratory: Normal respiratory effort, no problems with respiration noted  Abdomen: Soft, gravid, appropriate for gestational age.  Pain/Pressure: Present (Pressure)     Pelvic: Cervical exam deferred        Extremities: Normal range of motion.     Mental Status:  Normal mood and affect. Normal behavior. Normal judgment and thought content.   Assessment and Plan:   Pregnancy: G4P3003 at [redacted]w[redacted]d  1. Supervision of high risk pregnancy, antepartum (Primary) Continue routine prenatal care  - Glucose Tolerance, 2 Hours w/1 Hour; Future - Anemia Profile B; Future  2. [redacted] weeks gestation of pregnancy   3. Chronic hypertension during pregnancy, antepartum Pt is not taking  procardia  due to concern for headaches and normal BP.  No documented HTN other than MAU visit. Monitor BP for now, if BP elevates consider labetalol for treatment due to tolerance.  4. Obstructive sleep apnea on CPAP   5. S/P laparoscopic sleeve gastrectomy Pt unable to take NSAIDs and baby ASA  6. History of VBAC   7. History of gestational diabetes mellitus (GDM) in prior pregnancy, currently pregnant Will schedule early 2 hour GTT, add anemia profile  8. BMI 40.0-44.9, adult (HCC)   9. History of cesarean delivery followed by successful vaginal birth (VBAC)   Preterm labor symptoms and general obstetric precautions including but not limited to vaginal bleeding, contractions, leaking of fluid and fetal movement were reviewed in detail with the patient.  Please refer to After Visit Summary for other counseling recommendations.   Return in about 4 weeks (around 04/12/2024) for [facility], in person.   Jerilynn Buddle, MD Faculty Attending Center for [facility]

## 2024-03-27 ENCOUNTER — Ambulatory Visit (HOSPITAL_BASED_OUTPATIENT_CLINIC_OR_DEPARTMENT_OTHER)

## 2024-03-27 ENCOUNTER — Ambulatory Visit: Attending: Obstetrics and Gynecology | Admitting: Maternal & Fetal Medicine

## 2024-03-27 VITALS — BP 120/69 | HR 92

## 2024-03-27 DIAGNOSIS — O99210 Obesity complicating pregnancy, unspecified trimester: Secondary | ICD-10-CM

## 2024-03-27 DIAGNOSIS — Z3A20 20 weeks gestation of pregnancy: Secondary | ICD-10-CM | POA: Insufficient documentation

## 2024-03-27 DIAGNOSIS — O99842 Bariatric surgery status complicating pregnancy, second trimester: Secondary | ICD-10-CM

## 2024-03-27 DIAGNOSIS — O10919 Unspecified pre-existing hypertension complicating pregnancy, unspecified trimester: Secondary | ICD-10-CM

## 2024-03-27 DIAGNOSIS — Z8759 Personal history of other complications of pregnancy, childbirth and the puerperium: Secondary | ICD-10-CM

## 2024-03-27 DIAGNOSIS — O169 Unspecified maternal hypertension, unspecified trimester: Secondary | ICD-10-CM | POA: Diagnosis present

## 2024-03-27 DIAGNOSIS — O09292 Supervision of pregnancy with other poor reproductive or obstetric history, second trimester: Secondary | ICD-10-CM

## 2024-03-27 DIAGNOSIS — Z531 Procedure and treatment not carried out because of patient's decision for reasons of belief and group pressure: Secondary | ICD-10-CM | POA: Insufficient documentation

## 2024-03-27 DIAGNOSIS — O99212 Obesity complicating pregnancy, second trimester: Secondary | ICD-10-CM | POA: Diagnosis not present

## 2024-03-27 DIAGNOSIS — E669 Obesity, unspecified: Secondary | ICD-10-CM

## 2024-03-27 DIAGNOSIS — O0992 Supervision of high risk pregnancy, unspecified, second trimester: Secondary | ICD-10-CM | POA: Insufficient documentation

## 2024-03-27 DIAGNOSIS — O09299 Supervision of pregnancy with other poor reproductive or obstetric history, unspecified trimester: Secondary | ICD-10-CM

## 2024-03-27 DIAGNOSIS — Z9884 Bariatric surgery status: Secondary | ICD-10-CM | POA: Insufficient documentation

## 2024-03-27 DIAGNOSIS — O10012 Pre-existing essential hypertension complicating pregnancy, second trimester: Secondary | ICD-10-CM | POA: Diagnosis not present

## 2024-03-27 DIAGNOSIS — O0990 Supervision of high risk pregnancy, unspecified, unspecified trimester: Secondary | ICD-10-CM

## 2024-03-27 DIAGNOSIS — O10912 Unspecified pre-existing hypertension complicating pregnancy, second trimester: Secondary | ICD-10-CM

## 2024-03-27 NOTE — Progress Notes (Signed)
 Patient information  Patient Name: Colleen Fields  Patient MRN:   [id number]  Referring practice: MFM Referring Provider: [facility])  Problem List   Patient Active Problem List   Diagnosis Date Noted   Abnormal glucose affecting pregnancy 02/28/2024   Low vitamin D  level 02/28/2024   Supervision of high risk pregnancy, antepartum 02/09/2024   Chronic hypertension during pregnancy, antepartum 02/09/2024   Obesity in pregnancy 02/09/2024   History of gestational diabetes mellitus (GDM) in prior pregnancy, currently pregnant 02/09/2024   S/P laparoscopic sleeve gastrectomy 08/04/2022   History of severe pre-eclampsia 09/02/2020   Blood product declined 08/24/2020   BMI 40.0-44.9, adult (HCC) 08/21/2020   Obstructive sleep apnea on CPAP 02/28/2020   History of VBAC 01/23/2018   History of cesarean delivery followed by successful vaginal birth (VBAC) 07/22/2017   Maternal Fetal Medicine Consult Colleen Fields is a 33 y.o. [id number] at [redacted]w[redacted]d here for ultrasound and consultation. She has declined all genetic screening. She has no acute concerns.   Today we focused on the following:   The patient has 1 cesarean delivery followed by 2 successful VBAC's.  She desires a repeat VBAC.  She is also Jehovah's Witness and declines blood transfusions.  She is currently taking an ron supplementation.  We discussed the importance of maximizing her iron storage prior to her delivery.  She also has chronic hypertension that is well-controlled without medication.  She has not taken aspirin  but I advised her to start 81 mg daily due to her history of postpartum preeclampsia.  Even though there is a theoretical concern of ulcer formation in the history of bariatric surgery, low-dose aspirin  has not been shown to increase this risk significantly and the benefit of reducing the risk of preeclampsia likely outweighs the risk of developing gastrointestinal complications.  She  should monitor her symptoms for signs of ulcer formation and stop the aspirin  if she has any concerns..  She has a history of a laparoscopic sleeve in 2024.  She should continue to follow-up with her bariatric surgeon as needed and have her micronutrient levels assessed every trimester.  Ultrasound findings Single intrauterine pregnancy at 20w 3d. Fetal cardiac activity:  Observed and appears normal. Presentation: Cephalic. The anatomic structures that were well seen appear normal without evidence of soft markers. The anatomic survey is complete.  Fetal biometry shows the estimated fetal weight at the 61 percentile. Amniotic fluid: Within normal limits.  MVP: 4.66 cm. Placenta: Anterior. Adnexa: No abnormality visualized. Cervical length: 4.4 cm.  Recommendations - Aneuploidy screening was previously declined - Anatomy ultrasound was done today with the above findings (see report). - Aspirin  81-162 mg to start now and continued throughout the pregnancy for preeclampsia prophylaxis.  Even though she has a history of a gastric sleeve this is still a very low risk for any bleeding given the low dose.  Postpartum NSAIDs should be avoided - Baseline labs: CMP, CBC, urine protein creatinine ratio. - Blood pressure goal of < 140 systolic and < 90 diastolic. Antihypertensive medication should be added/adjusted until BP goal is achieved.  - Micronutrient labs every trimester they should include: Folic acid level, B12, iron panel, calcium , CBC, vitamin D .  - Serial growth ultrasounds every 4 weeks starting at 24 to 28 weeks until delivery. - Antenatal testing (usually weekly BPP or NST) weekly at 32 weeks due to elevated blood pressure and obesity. - Delivery likely around [redacted] weeks gestation or sooner if indicated.  She is interested in a water  birth.  I discussed that this is up to hospital policy and she should discuss this with her OB provider.  Review of Systems: A review of systems was performed  and was negative except per HPI   Past Obstetrical History:  OB History  Gravida Para Term Preterm AB Living  4 3 3  0  3  SAB IAB Ectopic Multiple Live Births     0 3    # Outcome Date GA Lbr Len/2nd Weight Sex Type Anes PTL Lv  4 Current           3 Term 08/24/20 [redacted]w[redacted]d 15:02 / 00:03 6 lb 7.9 oz (2.946 kg) M Vag-Spont EPI  LIV     Birth Comments: GDM, IOL due to GDM  2 Term 01/23/18 [redacted]w[redacted]d / 00:17 7 lb 1.1 oz (3.206 kg) M VBAC EPI  LIV     Birth Comments: GDM  1 Term 12/03/09 [redacted]w[redacted]d  6 lb 12 oz (3.062 kg) F CS-LTranv Spinal  LIV     Birth Comments: wnl; IOL due decel in FHR at MD office, then c/s due to [facility]     Complications: Failure to Progress in First Stage     Past Medical History:  Past Medical History:  Diagnosis Date   Anxiety    Anxiety and depression 12/09/2017   Chest pain    Complication of anesthesia    1st eipidural -didn't get relief from spinal- had severe pain during procedure, couldn't see   Depression    Dysrhythmia    palpitations   History of gestational diabetes mellitus 09/05/2017   [ ]  qyear a1c  09/2020: passed 2h post partum GTT   History of migraine 09/12/2020   History of severe pre-eclampsia 09/02/2020   Postpartum readmission for MGSO4   Migraine    Obesity 07/22/2017   Pre-diabetes    Tachycardia      Past Surgical History:    Past Surgical History:  Procedure Laterality Date   CESAREAN SECTION     LAPAROSCOPIC GASTRIC SLEEVE RESECTION N/A 08/04/2022   Procedure: LAPAROSCOPIC SLEEVE GASTRECTOMY;  Surgeon: Tanda Locus, MD;  Location: [facility];  Service: General;  Laterality: N/A;   UPPER GI ENDOSCOPY N/A 08/04/2022   Procedure: UPPER GI ENDOSCOPY;  Surgeon: Tanda Locus, MD;  Location: [facility];  Service: General;  Laterality: N/A;   WISDOM TOOTH EXTRACTION       Home Medications:   Current Outpatient Medications on File Prior to Visit  Medication Sig Dispense Refill   Prenatal Vit-Fe Fumarate-FA (PREPLUS) 27-1 MG TABS Take 1 tablet by  mouth daily.     Vitamin D , Ergocalciferol , (DRISDOL ) 1.25 MG (50000 UNIT) CAPS capsule Take 1 capsule (50,000 Units total) by mouth every 7 (seven) days for 12 doses. 12 capsule 0   Blood Pressure Monitoring (BLOOD PRESSURE KIT) DEVI 1 Device by Does not apply route as needed. (Patient not taking: Reported on 02/17/2024) 1 each 0   ferrous sulfate  324 MG TBEC Take 1 tablet (324 mg total) by mouth every other day. 90 tablet 2   NIFEdipine  (PROCARDIA  XL) 30 MG 24 hr tablet Take 1 tablet (30 mg total) by mouth daily. 30 tablet 5   No current facility-administered medications on file prior to visit.      Allergies:   Allergies  Allergen Reactions   Latex Itching and Other (See Comments)    Only occurs when exposed over a long duration, no respiratory issues  Physical Exam:   Vitals:   03/27/24 1008  BP: 120/69  Pulse: 92   Sitting comfortably on the sonogram table Nonlabored breathing Normal rate and rhythm Abdomen is nontender  Thank you for the opportunity to be involved with this patient's care. Please let us  know if we can be of any further assistance.   60 minutes of time was spent reviewing the patient's chart including labs, imaging and documentation.  At least 50% of this time was spent with direct patient care discussing the diagnosis, management and prognosis of her care.  Delora Smaller MFM, [facility]   03/27/2024  11:15 AM

## 2024-03-31 ENCOUNTER — Other Ambulatory Visit

## 2024-04-07 ENCOUNTER — Other Ambulatory Visit: Payer: Self-pay

## 2024-04-07 ENCOUNTER — Other Ambulatory Visit

## 2024-04-07 DIAGNOSIS — O0990 Supervision of high risk pregnancy, unspecified, unspecified trimester: Secondary | ICD-10-CM

## 2024-04-08 LAB — ANEMIA PROFILE B
Basophils Absolute: 0 x10E3/uL (ref 0.0–0.2)
Basos: 0 %
EOS (ABSOLUTE): 0.1 x10E3/uL (ref 0.0–0.4)
Eos: 1 %
Ferritin: 19 ng/mL (ref 15–150)
Folate: 19.6 ng/mL (ref >3.0)
Hematocrit: 30.7 % — ABNORMAL LOW (ref 34.0–46.6)
Hemoglobin: 9.8 g/dL — ABNORMAL LOW (ref 11.1–15.9)
Immature Grans (Abs): 0.1 x10E3/uL (ref 0.0–0.1)
Immature Granulocytes: 1 %
Iron Saturation: 28 % (ref 15–55)
Iron: 115 ug/dL (ref 27–159)
Lymphocytes Absolute: 1.5 x10E3/uL (ref 0.7–3.1)
Lymphs: 26 %
MCH: 30.4 pg (ref 26.6–33.0)
MCHC: 31.9 g/dL (ref 31.5–35.7)
MCV: 95 fL (ref 79–97)
Monocytes Absolute: 0.3 x10E3/uL (ref 0.1–0.9)
Monocytes: 6 %
Neutrophils Absolute: 3.8 x10E3/uL (ref 1.4–7.0)
Neutrophils: 66 %
Platelets: 225 x10E3/uL (ref 150–450)
RBC: 3.22 x10E6/uL — ABNORMAL LOW (ref 3.77–5.28)
RDW: 13.9 % (ref 11.7–15.4)
Retic Ct Pct: 2.3 % (ref 0.6–2.6)
Total Iron Binding Capacity: 409 ug/dL (ref 250–450)
UIBC: 294 ug/dL (ref 131–425)
Vitamin B-12: 393 pg/mL (ref 232–1245)
WBC: 5.8 x10E3/uL (ref 3.4–10.8)

## 2024-04-08 LAB — GLUCOSE TOLERANCE, 2 HOURS W/ 1HR
Glucose, 1 hour: 169 mg/dL (ref 70–179)
Glucose, 2 hour: 76 mg/dL (ref 70–152)
Glucose, Fasting: 90 mg/dL (ref 70–91)

## 2024-04-10 ENCOUNTER — Ambulatory Visit: Payer: Self-pay | Admitting: Obstetrics and Gynecology

## 2024-04-11 ENCOUNTER — Other Ambulatory Visit: Payer: Self-pay

## 2024-04-11 ENCOUNTER — Ambulatory Visit: Admitting: Obstetrics and Gynecology

## 2024-04-11 VITALS — BP 115/78 | HR 96 | Wt 209.0 lb

## 2024-04-11 DIAGNOSIS — G4733 Obstructive sleep apnea (adult) (pediatric): Secondary | ICD-10-CM

## 2024-04-11 DIAGNOSIS — Z3A22 22 weeks gestation of pregnancy: Secondary | ICD-10-CM | POA: Diagnosis not present

## 2024-04-11 DIAGNOSIS — O0992 Supervision of high risk pregnancy, unspecified, second trimester: Secondary | ICD-10-CM | POA: Diagnosis not present

## 2024-04-11 DIAGNOSIS — Z8759 Personal history of other complications of pregnancy, childbirth and the puerperium: Secondary | ICD-10-CM

## 2024-04-11 DIAGNOSIS — Z9884 Bariatric surgery status: Secondary | ICD-10-CM

## 2024-04-11 DIAGNOSIS — O99012 Anemia complicating pregnancy, second trimester: Secondary | ICD-10-CM

## 2024-04-11 DIAGNOSIS — O10912 Unspecified pre-existing hypertension complicating pregnancy, second trimester: Secondary | ICD-10-CM

## 2024-04-11 DIAGNOSIS — Z6841 Body Mass Index (BMI) 40.0 and over, adult: Secondary | ICD-10-CM

## 2024-04-11 DIAGNOSIS — O99019 Anemia complicating pregnancy, unspecified trimester: Secondary | ICD-10-CM | POA: Insufficient documentation

## 2024-04-11 DIAGNOSIS — Z98891 History of uterine scar from previous surgery: Secondary | ICD-10-CM

## 2024-04-11 MED ORDER — ASPIRIN 81 MG PO CHEW: 81.0000 mg | CHEWABLE_TABLET | Freq: Every day | ORAL | 2 refills | Status: DC

## 2024-04-11 NOTE — Progress Notes (Signed)
   PRENATAL VISIT NOTE  Subjective:  Colleen Fields is a 33 y.o. [phone] at [redacted]w[redacted]d being seen today for ongoing prenatal care.  She is currently monitored for the following issues for this high-risk pregnancy and has History of cesarean delivery followed by successful vaginal birth (VBAC); History of VBAC; Obstructive sleep apnea on CPAP; BMI 40.0-44.9, adult (HCC); Blood product declined; History of severe pre-eclampsia; S/P laparoscopic sleeve gastrectomy; Supervision of high risk pregnancy, antepartum, second trimester; Chronic hypertension with exacerbation during pregnancy in second trimester; Obesity in pregnancy; History of gestational diabetes mellitus (GDM) in prior pregnancy, currently pregnant; Abnormal glucose affecting pregnancy; Low vitamin D  level; Blood transfusion declined due to reasons of conscience (JW); and Anemia during pregnancy on their problem list.  Patient doing well with no acute concerns today. She reports no complaints.  Contractions: Not present. Vag. Bleeding: None.  Movement: Present. Denies leaking of fluid.   The following portions of the patient's history were reviewed and updated as appropriate: allergies, current medications, past family history, past medical history, past social history, past surgical history and problem list. Problem list updated.  Objective:   Vitals:   04/11/24 1035  BP: 115/78  Pulse: 96  Weight: 209 lb (94.8 kg)    Fetal Status: Fetal Heart Rate (bpm): 143 Fundal Height: 24 cm Movement: Present     General:  Alert, oriented and cooperative. Patient is in no acute distress.  Skin: Skin is warm and dry. No rash noted.   Cardiovascular: Normal heart rate noted  Respiratory: Normal respiratory effort, no problems with respiration noted  Abdomen: Soft, gravid, appropriate for gestational age.  Pain/Pressure: Present     Pelvic: Cervical exam deferred        Extremities: Normal range of motion.  Edema: None  Mental Status:  Normal  mood and affect. Normal behavior. Normal judgment and thought content.   Assessment and Plan:  Pregnancy: G4P3003 at [redacted]w[redacted]d  1. [redacted] weeks gestation of pregnancy (Primary)   2. Supervision of high risk pregnancy, antepartum, second trimester Continue routine prenatal care Recent early 2 hour GTT normal Recommend recheck at around 28-30 weeks Recheck folic acid etc at that time as well  - US  MFM OB FOLLOW UP; Future  3. Obstructive sleep apnea on CPAP   4. Chronic hypertension with exacerbation during pregnancy in second trimester BP currently WNL without meds  5. S/P laparoscopic sleeve gastrectomy   6. History of VBAC Pt desires TOLAC, cannot waterbirth due to criteria  7. History of severe pre-eclampsia Reviewed MFM, risks and benefits discussed previously regarding baby ASA, MFM recommends taking it for prophylaxis but to monitor for ulcer symptoms - aspirin  81 MG chewable tablet; Chew 1 tablet (81 mg total) by mouth daily.  Dispense: 90 tablet; Refill: 2  8. History of cesarean delivery followed by successful vaginal birth (VBAC)   9. BMI 40.0-44.9, adult (HCC)   10. Anemia during pregnancy Order placed for iron infusions to optimize for delivery  Preterm labor symptoms and general obstetric precautions including but not limited to vaginal bleeding, contractions, leaking of fluid and fetal movement were reviewed in detail with the patient.  Please refer to After Visit Summary for other counseling recommendations.   Return in about 4 weeks (around 05/09/2024) for [facility], in person.   Jerilynn Buddle, MD Faculty Attending Center for [facility]

## 2024-04-11 NOTE — Patient Instructions (Signed)
   Considering Waterbirth? Guide for patients at Center for [facility]) Why consider waterbirth? Gentle birth for babies  Less pain medicine used in labor  May allow for passive descent/less pushing  May reduce perineal tears  More mobility and instinctive maternal position changes  Increased maternal relaxation   Is waterbirth safe? What are the risks of infection, drowning or other complications? Infection:  Very low risk (3.7 % for tub vs 4.8% for bed)  7 in 8000 waterbirths with documented infection  Poorly cleaned equipment most common cause  Slightly lower group B strep transmission rate  Drowning  Maternal:  Very low risk  Related to seizures or fainting  Newborn:  Very low risk. No evidence of increased risk of respiratory problems in multiple large studies  Physiological protection from breathing under water  Avoid underwater birth if there are any fetal complications  Once baby's head is out of the water, keep it out.  Birth complication  Some reports of cord trauma, but risk decreased by bringing baby to surface gradually  No evidence of increased risk of shoulder dystocia. Mothers can usually change positions faster in water than in a bed, possibly aiding the maneuvers to free the shoulder.   There are 2 things you MUST do to have a waterbirth with [facility]: Attend a waterbirth class at [facility] at [facility]   3rd Wednesday of every month from 7-9 pm (virtual during COVID) [employer] at [email or web address] or [email or web address] or by calling [phone] Bring us  the certificate from the class to your prenatal appointment or send via MyChart Meet with a midwife at 36 weeks* to see if you can still plan a waterbirth and to sign the consent.   *We also recommend that you schedule as many of your prenatal visits with a midwife as possible.    Helpful information: You may want to bring a bathing suit top to the hospital  to wear during labor but this is optional.  All other supplies are provided by the hospital. Please arrive at the hospital with signs of active labor, and do not wait at home until late in labor. It takes 45 min- 1 hour for fetal monitoring, and check in to your room to take place, plus transport and filling of the waterbirth tub.    Things that would prevent you from having a waterbirth: Premature, <37wks  Previous cesarean birth  Presence of thick meconium-stained fluid  Multiple gestation (Twins, triplets, etc.)  Uncontrolled diabetes or gestational diabetes requiring medication  Hypertension diagnosed in pregnancy or preexisting hypertension (gestational hypertension, preeclampsia, or chronic hypertension) Fetal growth restriction (your baby measures less than 10th percentile on ultrasound) Heavy vaginal bleeding  Non-reassuring fetal heart rate  Active infection (MRSA, etc.). Group B Strep is NOT a contraindication for waterbirth.  If your labor has to be induced and induction method requires continuous monitoring of the baby's heart rate  Other risks/issues identified by your obstetrical provider   Please remember that birth is unpredictable. Under certain unforeseeable circumstances your provider may advise against giving birth in the tub. These decisions will be made on a case-by-case basis and with the safety of you and your baby as our highest priority.    Updated 10/15/21

## 2024-04-12 ENCOUNTER — Telehealth: Payer: Self-pay | Admitting: Pharmacy Technician

## 2024-04-12 NOTE — Telephone Encounter (Signed)
 Dr. Zina, Patient will be scheduled as soon as possible.  Auth Submission: NO AUTH NEEDED Site of care: Site of care: [facility] Payer: [employer] Medication & CPT/J Code(s) submitted: Venofer (Iron Sucrose) J1756 Diagnosis Code:  Route of submission (phone, fax, portal):  Phone # Fax # Auth type: Buy/Bill PB Units/visits requested: 5 DOSES Reference number:  Approval from: 04/12/24 to 07/12/24

## 2024-04-13 ENCOUNTER — Telehealth: Payer: Self-pay

## 2024-04-28 ENCOUNTER — Other Ambulatory Visit: Payer: Self-pay | Admitting: *Deleted

## 2024-04-28 ENCOUNTER — Ambulatory Visit: Attending: Obstetrics and Gynecology | Admitting: Obstetrics

## 2024-04-28 ENCOUNTER — Ambulatory Visit

## 2024-04-28 VITALS — BP 126/71 | HR 87

## 2024-04-28 DIAGNOSIS — Z79899 Other long term (current) drug therapy: Secondary | ICD-10-CM | POA: Diagnosis not present

## 2024-04-28 DIAGNOSIS — Z8632 Personal history of gestational diabetes: Secondary | ICD-10-CM

## 2024-04-28 DIAGNOSIS — O99842 Bariatric surgery status complicating pregnancy, second trimester: Secondary | ICD-10-CM

## 2024-04-28 DIAGNOSIS — O09292 Supervision of pregnancy with other poor reproductive or obstetric history, second trimester: Secondary | ICD-10-CM | POA: Diagnosis not present

## 2024-04-28 DIAGNOSIS — O10012 Pre-existing essential hypertension complicating pregnancy, second trimester: Secondary | ICD-10-CM

## 2024-04-28 DIAGNOSIS — O0992 Supervision of high risk pregnancy, unspecified, second trimester: Secondary | ICD-10-CM

## 2024-04-28 DIAGNOSIS — E669 Obesity, unspecified: Secondary | ICD-10-CM

## 2024-04-28 DIAGNOSIS — O99210 Obesity complicating pregnancy, unspecified trimester: Secondary | ICD-10-CM

## 2024-04-28 DIAGNOSIS — Z7982 Long term (current) use of aspirin: Secondary | ICD-10-CM | POA: Diagnosis not present

## 2024-04-28 DIAGNOSIS — O10912 Unspecified pre-existing hypertension complicating pregnancy, second trimester: Secondary | ICD-10-CM

## 2024-04-28 DIAGNOSIS — O34219 Maternal care for unspecified type scar from previous cesarean delivery: Secondary | ICD-10-CM

## 2024-04-28 DIAGNOSIS — O99212 Obesity complicating pregnancy, second trimester: Secondary | ICD-10-CM | POA: Diagnosis present

## 2024-04-28 DIAGNOSIS — E119 Type 2 diabetes mellitus without complications: Secondary | ICD-10-CM | POA: Insufficient documentation

## 2024-04-28 DIAGNOSIS — O24112 Pre-existing diabetes mellitus, type 2, in pregnancy, second trimester: Secondary | ICD-10-CM | POA: Diagnosis not present

## 2024-04-28 DIAGNOSIS — Z3A25 25 weeks gestation of pregnancy: Secondary | ICD-10-CM

## 2024-04-28 NOTE — Progress Notes (Signed)
 MFM Consult Note  Colleen Fields is currently at 25 weeks and 0 days.  She has been followed due to maternal obesity with a BMI of 40.7, chronic hypertension that is not treated with any medications, and history of gastric sleeve surgery.    She denies any problems since her last exam.  Her blood pressure today was 126/71.  She has declined all screening test for fetal aneuploidy and her current pregnancy.  Sonographic findings Single intrauterine pregnancy at 25w 0d.  Fetal cardiac activity:  Observed and appears normal. Presentation: Cephalic. Interval fetal anatomy appears normal. Fetal biometry shows the estimated fetal weight of 1 pound 13 ounces which measures at the 60th percentile. Amniotic fluid volume: Within normal limits. MVP: 4.3 cm. Placenta: Anterior.  There were no obvious fetal anomalies noted on today's exam.  The limitations of ultrasound in the detection of all anomalies was discussed.  There are limitations of prenatal ultrasound such as the inability to detect certain abnormalities due to poor visualization. Various factors such as fetal position, gestational age and maternal body habitus may increase the difficulty in visualizing the fetal anatomy.    Due to her history of chronic hypertension and maternal obesity, we will continue to follow her with growth ultrasounds throughout her pregnancy.  The increased risk of superimposed preeclampsia due to her underlying medical conditions was discussed.  The patient reports that she has not been taking a daily baby aspirin  as she is afraid of the effects that it would have on the fetus.  We will start weekly fetal testing at 34 weeks.  Due to her underlying medical conditions, delivery is recommended at around 38 weeks.    A follow-up growth scan was scheduled in 4 weeks.  The patient stated that all of her questions were answered today.  A total of 20 minutes was spent counseling and coordinating the care for  this patient.  Greater than 50% of the time was spent in direct face-to-face contact.

## 2024-05-01 ENCOUNTER — Ambulatory Visit (INDEPENDENT_AMBULATORY_CARE_PROVIDER_SITE_OTHER)

## 2024-05-01 VITALS — BP 104/70 | HR 99 | Temp 97.7°F | Resp 16 | Ht 59.0 in | Wt 211.6 lb

## 2024-05-01 DIAGNOSIS — Z531 Procedure and treatment not carried out because of patient's decision for reasons of belief and group pressure: Secondary | ICD-10-CM

## 2024-05-01 DIAGNOSIS — O99019 Anemia complicating pregnancy, unspecified trimester: Secondary | ICD-10-CM

## 2024-05-01 DIAGNOSIS — Z3A25 25 weeks gestation of pregnancy: Secondary | ICD-10-CM | POA: Diagnosis not present

## 2024-05-01 MED ORDER — IRON SUCROSE 20 MG/ML IV SOLN
200.0000 mg | Freq: Once | INTRAVENOUS | Status: AC
  Administered 2024-05-01: 200 mg via INTRAVENOUS
  Filled 2024-05-01: qty 10

## 2024-05-01 NOTE — Progress Notes (Signed)
 Diagnosis: Iron Deficiency Anemia  Provider:  Praveen Mannam MD  Procedure: IV Push  IV Type: Peripheral, IV Location: L Antecubital  Venofer (Iron Sucrose), Dose: 200 mg  Post Infusion IV Care: Observation period completed and Peripheral IV Discontinued  Discharge: Condition: Good, Destination: Home . AVS Declined  Performed by:  Waddell LOISE Freshwater, RN

## 2024-05-03 ENCOUNTER — Ambulatory Visit

## 2024-05-03 VITALS — BP 114/62 | HR 91 | Temp 98.7°F | Resp 18 | Ht 59.0 in | Wt 209.2 lb

## 2024-05-03 DIAGNOSIS — Z531 Procedure and treatment not carried out because of patient's decision for reasons of belief and group pressure: Secondary | ICD-10-CM

## 2024-05-03 DIAGNOSIS — Z3A25 25 weeks gestation of pregnancy: Secondary | ICD-10-CM

## 2024-05-03 DIAGNOSIS — O99019 Anemia complicating pregnancy, unspecified trimester: Secondary | ICD-10-CM | POA: Diagnosis not present

## 2024-05-03 MED ORDER — SODIUM CHLORIDE 0.9 % IV SOLN
200.0000 mg | Freq: Once | INTRAVENOUS | Status: AC
  Administered 2024-05-03: 200 mg via INTRAVENOUS
  Filled 2024-05-03: qty 10

## 2024-05-03 NOTE — Progress Notes (Signed)
 Diagnosis: Iron Deficiency Anemia  Provider:  Praveen Mannam MD  Procedure: IV Infusion  IV Type: Peripheral, IV Location: L Antecubital  Venofer (Iron Sucrose), Dose: 200 mg  Infusion Start Time: 1143  Infusion Stop Time: 1143  Post Infusion IV Care: Observation period completed and Peripheral IV Discontinued  Discharge: Condition: Good, Destination: Home . AVS Provided  Performed by:  Haneen Bernales, RN

## 2024-05-05 ENCOUNTER — Ambulatory Visit

## 2024-05-05 VITALS — BP 94/66 | HR 102 | Temp 98.3°F | Resp 18 | Ht 59.0 in | Wt 209.0 lb

## 2024-05-05 DIAGNOSIS — O99012 Anemia complicating pregnancy, second trimester: Secondary | ICD-10-CM

## 2024-05-05 DIAGNOSIS — Z3A26 26 weeks gestation of pregnancy: Secondary | ICD-10-CM | POA: Diagnosis not present

## 2024-05-05 DIAGNOSIS — D649 Anemia, unspecified: Secondary | ICD-10-CM

## 2024-05-05 DIAGNOSIS — O99019 Anemia complicating pregnancy, unspecified trimester: Secondary | ICD-10-CM

## 2024-05-05 DIAGNOSIS — Z531 Procedure and treatment not carried out because of patient's decision for reasons of belief and group pressure: Secondary | ICD-10-CM

## 2024-05-05 MED ORDER — IRON SUCROSE 200 MG IVPB - SIMPLE MED: 200.0000 mg | Freq: Once | Status: DC

## 2024-05-05 MED ORDER — SODIUM CHLORIDE 0.9 % IV SOLN
200.0000 mg | Freq: Once | INTRAVENOUS | Status: AC
  Administered 2024-05-05: 200 mg via INTRAVENOUS
  Filled 2024-05-05: qty 10

## 2024-05-05 NOTE — Progress Notes (Signed)
 Diagnosis: Anemia during pregnancy  Provider:  Lonna Coder MD  Procedure: IV Infusion  IV Type: Peripheral, IV Location: L Antecubital  Venofer (Iron Sucrose), Dose: 200 mg  Infusion Start Time: 1150  Infusion Stop Time: 1209  Post Infusion IV Care: Observation period completed and Peripheral IV Discontinued  Discharge: Condition: Good, Destination: Home . AVS Declined  Performed by:  Kinslie Hove G [place]tt, RN

## 2024-05-08 ENCOUNTER — Ambulatory Visit

## 2024-05-08 MED ORDER — IRON SUCROSE 20 MG/ML IV SOLN: 200.0000 mg | Freq: Once | INTRAVENOUS | Status: DC

## 2024-05-09 ENCOUNTER — Encounter: Admitting: Obstetrics and Gynecology

## 2024-05-10 ENCOUNTER — Ambulatory Visit (INDEPENDENT_AMBULATORY_CARE_PROVIDER_SITE_OTHER)

## 2024-05-10 VITALS — BP 118/74 | HR 107 | Temp 98.6°F | Resp 16 | Ht 59.0 in | Wt 210.6 lb

## 2024-05-10 DIAGNOSIS — O99019 Anemia complicating pregnancy, unspecified trimester: Secondary | ICD-10-CM

## 2024-05-10 DIAGNOSIS — Z3A26 26 weeks gestation of pregnancy: Secondary | ICD-10-CM

## 2024-05-10 DIAGNOSIS — Z531 Procedure and treatment not carried out because of patient's decision for reasons of belief and group pressure: Secondary | ICD-10-CM | POA: Diagnosis not present

## 2024-05-10 MED ORDER — SODIUM CHLORIDE 0.9 % IV SOLN
200.0000 mg | Freq: Once | INTRAVENOUS | Status: AC
  Administered 2024-05-10: 200 mg via INTRAVENOUS
  Filled 2024-05-10: qty 10

## 2024-05-10 NOTE — Progress Notes (Signed)
 Diagnosis: Iron Deficiency Anemia  Provider:  Praveen Mannam MD  Procedure: IV Infusion  IV Type: Peripheral, IV Location: L Antecubital  Venofer (Iron Sucrose), Dose: 200 mg  Infusion Start Time: 1142  Infusion Stop Time: 1158  Post Infusion IV Care: Patient declined observation and Peripheral IV Discontinued  Discharge: Condition: Good, Destination: Home . AVS Declined  Performed by:  Leita FORBES Miles, LPN

## 2024-05-16 ENCOUNTER — Encounter: Payer: Self-pay | Admitting: Obstetrics and Gynecology

## 2024-05-16 ENCOUNTER — Other Ambulatory Visit: Payer: Self-pay

## 2024-05-16 ENCOUNTER — Ambulatory Visit: Admitting: Obstetrics and Gynecology

## 2024-05-16 VITALS — BP 112/76 | HR 107 | Wt 209.0 lb

## 2024-05-16 DIAGNOSIS — Z8632 Personal history of gestational diabetes: Secondary | ICD-10-CM

## 2024-05-16 DIAGNOSIS — O99019 Anemia complicating pregnancy, unspecified trimester: Secondary | ICD-10-CM

## 2024-05-16 DIAGNOSIS — O09292 Supervision of pregnancy with other poor reproductive or obstetric history, second trimester: Secondary | ICD-10-CM

## 2024-05-16 DIAGNOSIS — Z8759 Personal history of other complications of pregnancy, childbirth and the puerperium: Secondary | ICD-10-CM | POA: Diagnosis not present

## 2024-05-16 DIAGNOSIS — Z6841 Body Mass Index (BMI) 40.0 and over, adult: Secondary | ICD-10-CM

## 2024-05-16 DIAGNOSIS — Z3A27 27 weeks gestation of pregnancy: Secondary | ICD-10-CM

## 2024-05-16 DIAGNOSIS — Z98891 History of uterine scar from previous surgery: Secondary | ICD-10-CM

## 2024-05-16 DIAGNOSIS — O10912 Unspecified pre-existing hypertension complicating pregnancy, second trimester: Secondary | ICD-10-CM | POA: Diagnosis not present

## 2024-05-16 DIAGNOSIS — O99012 Anemia complicating pregnancy, second trimester: Secondary | ICD-10-CM

## 2024-05-16 DIAGNOSIS — O0992 Supervision of high risk pregnancy, unspecified, second trimester: Secondary | ICD-10-CM

## 2024-05-16 DIAGNOSIS — Z531 Procedure and treatment not carried out because of patient's decision for reasons of belief and group pressure: Secondary | ICD-10-CM

## 2024-05-16 DIAGNOSIS — Z9884 Bariatric surgery status: Secondary | ICD-10-CM

## 2024-05-16 DIAGNOSIS — O99810 Abnormal glucose complicating pregnancy: Secondary | ICD-10-CM

## 2024-05-16 DIAGNOSIS — O09299 Supervision of pregnancy with other poor reproductive or obstetric history, unspecified trimester: Secondary | ICD-10-CM

## 2024-05-16 NOTE — Progress Notes (Signed)
 PRENATAL VISIT NOTE  Subjective:  Colleen Fields is a 33 y.o. [phone] at [redacted]w[redacted]d being seen today for ongoing prenatal care.  She is currently monitored for the following issues for this high-risk pregnancy and has History of cesarean delivery followed by successful vaginal birth (VBAC); History of VBAC; Obstructive sleep apnea on CPAP; BMI 40.0-44.9, adult (HCC); Blood product declined; History of severe pre-eclampsia; S/P laparoscopic sleeve gastrectomy; Supervision of high risk pregnancy, antepartum, second trimester; Chronic hypertension with exacerbation during pregnancy in second trimester; Obesity in pregnancy; History of gestational diabetes mellitus (GDM) in prior pregnancy, currently pregnant; Abnormal glucose affecting pregnancy; Low vitamin D  level; Blood transfusion declined due to reasons of conscience (JW); and Anemia during pregnancy on their problem list.  Patient reports fatigue.  Contractions: Not present. Vag. Bleeding: None.  Movement: Present. Denies leaking of fluid.   The following portions of the patient's history were reviewed and updated as appropriate: allergies, current medications, past family history, past medical history, past social history, past surgical history and problem list.   Objective:   Vitals:   05/16/24 1536  BP: 112/76  Pulse: (!) 107  Weight: 209 lb (94.8 kg)    Fetal Status:  Fetal Heart Rate (bpm): 156   Movement: Present    General: Alert, oriented and cooperative. Patient is in no acute distress.  Skin: Skin is warm and dry. No rash noted.   Cardiovascular: Normal heart rate noted  Respiratory: Normal respiratory effort, no problems with respiration noted  Abdomen: Soft, gravid, appropriate for gestational age.  Pain/Pressure: Present (pressure some previous c section pain)     Pelvic: Cervical exam deferred        Extremities: Normal range of motion.  Edema: None  Mental Status: Normal mood and affect. Normal behavior. Normal  judgment and thought content.      09/23/2020    9:13 AM 09/12/2020    2:13 PM 08/22/2020    8:19 AM  Depression screen PHQ 2/9  Decreased Interest 1 3 2   Down, Depressed, Hopeless 1 1 2   PHQ - 2 Score 2 4 4   Altered sleeping 1 0 2  Tired, decreased energy 1 3 2   Change in appetite 1 0 3  Feeling bad or failure about yourself  1 0 3  Trouble concentrating 1 1 1   Moving slowly or fidgety/restless 1 2 0  Suicidal thoughts 1 0 0  PHQ-9 Score 9 10 15         09/23/2020    9:13 AM 09/12/2020    2:14 PM 08/22/2020    8:20 AM 08/14/2020    3:59 PM  GAD 7 : Generalized Anxiety Score  Nervous, Anxious, on Edge 1 2 3 3   Control/stop worrying 2 1 2 3   Worry too much - different things 2 2 3 3   Trouble relaxing 1 0 3 0  Restless 0 0 0 0  Easily annoyed or irritable 2 1 3 3   Afraid - awful might happen 0 0 1 0  Total GAD 7 Score 8 6 15 12     Assessment and Plan:  Pregnancy: G4P3003 at [redacted]w[redacted]d  1. Chronic hypertension with exacerbation during pregnancy in second trimester (Primary) Was on in first trimester, then stopped when it normalized  2. History of cesarean delivery followed by successful vaginal birth (VBAC) Reviewed risks/benefits of TOLAC versus RCS in detail. Patient counseled regarding potential vaginal delivery, chance of success, future implications, possible uterine rupture and need for urgent/emergent repeat cesarean. Counseled regarding  potential need for repeat c-section for reasons unrelated to first c-section. Counseled regarding scheduled repeat cesarean including risks of bleeding, infection, damage to surrounding tissue, abnormal placentation, implications for future pregnancies. All questions answered.  Patient undecided.  3. History of VBAC  4. BMI 40.0-44.9, adult (HCC)  5. History of severe pre-eclampsia Was Rx baby aspirin , not taking  6. Abnormal glucose affecting pregnancy  7. History of gestational diabetes mellitus (GDM) in prior pregnancy, currently  pregnant Needs 3rd trim 2 hr GTT  8. Supervision of high risk pregnancy, antepartum, second trimester Patient concerned about pain management during labor, had bad experience with c-section but did better with epidurals and wants to make sure she is comfortable during labor  9. S/P laparoscopic sleeve gastrectomy  10. Anemia during pregnancy Getting IV iron transfusions, last one scheduled for tomorrow  11. Blood transfusion declined due to reasons of conscience (JW)  12. [redacted] weeks gestation of pregnancy   Preterm labor symptoms and general obstetric precautions including but not limited to vaginal bleeding, contractions, leaking of fluid and fetal movement were reviewed in detail with the patient. Please refer to After Visit Summary for other counseling recommendations.   Return in about 2 weeks (around 05/30/2024) for 2 hr GTT, high OB.  Future Appointments  Date Time Provider Department Center  05/17/2024 12:00 PM [facility] None  05/29/2024  3:15 PM [facility] PROVIDER 1 [facility]  05/29/2024  3:30 PM [facility] US1 [facility]    Burnard CHRISTELLA Moats, MD

## 2024-05-17 ENCOUNTER — Ambulatory Visit (INDEPENDENT_AMBULATORY_CARE_PROVIDER_SITE_OTHER)

## 2024-05-17 VITALS — BP 118/82 | HR 106 | Temp 98.4°F | Resp 16 | Ht 59.0 in | Wt 209.6 lb

## 2024-05-17 DIAGNOSIS — Z531 Procedure and treatment not carried out because of patient's decision for reasons of belief and group pressure: Secondary | ICD-10-CM | POA: Diagnosis not present

## 2024-05-17 DIAGNOSIS — O99019 Anemia complicating pregnancy, unspecified trimester: Secondary | ICD-10-CM | POA: Diagnosis not present

## 2024-05-17 DIAGNOSIS — Z3A27 27 weeks gestation of pregnancy: Secondary | ICD-10-CM | POA: Diagnosis not present

## 2024-05-17 MED ORDER — SODIUM CHLORIDE 0.9 % IV SOLN
200.0000 mg | Freq: Once | INTRAVENOUS | Status: AC
  Administered 2024-05-17: 200 mg via INTRAVENOUS
  Filled 2024-05-17: qty 10

## 2024-05-17 NOTE — Progress Notes (Signed)
 Diagnosis: Iron Deficiency Anemia  Provider:  Praveen Mannam MD  Procedure: IV Infusion  IV Type: Peripheral, IV Location: L Antecubital  Venofer (Iron Sucrose), Dose: 200 mg  Infusion Start Time: 1231  Infusion Stop Time: 1247  Post Infusion IV Care: Patient declined observation  Discharge: Condition: Good, Destination: Home . AVS Declined  Performed by:  Leita FORBES Miles, LPN

## 2024-05-26 ENCOUNTER — Other Ambulatory Visit: Payer: Self-pay | Admitting: Obstetrics and Gynecology

## 2024-05-29 ENCOUNTER — Other Ambulatory Visit: Payer: Self-pay

## 2024-05-29 ENCOUNTER — Ambulatory Visit: Attending: Obstetrics and Gynecology

## 2024-05-29 ENCOUNTER — Ambulatory Visit

## 2024-05-29 ENCOUNTER — Other Ambulatory Visit: Payer: Self-pay | Admitting: *Deleted

## 2024-05-29 VITALS — BP 119/63 | HR 90

## 2024-05-29 DIAGNOSIS — O09293 Supervision of pregnancy with other poor reproductive or obstetric history, third trimester: Secondary | ICD-10-CM

## 2024-05-29 DIAGNOSIS — O99843 Bariatric surgery status complicating pregnancy, third trimester: Secondary | ICD-10-CM

## 2024-05-29 DIAGNOSIS — Z8632 Personal history of gestational diabetes: Secondary | ICD-10-CM | POA: Insufficient documentation

## 2024-05-29 DIAGNOSIS — O10013 Pre-existing essential hypertension complicating pregnancy, third trimester: Secondary | ICD-10-CM | POA: Diagnosis not present

## 2024-05-29 DIAGNOSIS — O99213 Obesity complicating pregnancy, third trimester: Secondary | ICD-10-CM | POA: Diagnosis not present

## 2024-05-29 DIAGNOSIS — O09299 Supervision of pregnancy with other poor reproductive or obstetric history, unspecified trimester: Secondary | ICD-10-CM | POA: Diagnosis present

## 2024-05-29 DIAGNOSIS — O10912 Unspecified pre-existing hypertension complicating pregnancy, second trimester: Secondary | ICD-10-CM | POA: Diagnosis present

## 2024-05-29 DIAGNOSIS — Z3A29 29 weeks gestation of pregnancy: Secondary | ICD-10-CM

## 2024-05-29 DIAGNOSIS — O99210 Obesity complicating pregnancy, unspecified trimester: Secondary | ICD-10-CM | POA: Insufficient documentation

## 2024-05-29 DIAGNOSIS — O0992 Supervision of high risk pregnancy, unspecified, second trimester: Secondary | ICD-10-CM | POA: Insufficient documentation

## 2024-05-29 DIAGNOSIS — E669 Obesity, unspecified: Secondary | ICD-10-CM | POA: Diagnosis not present

## 2024-05-30 ENCOUNTER — Encounter: Admitting: Family Medicine

## 2024-05-30 ENCOUNTER — Other Ambulatory Visit

## 2024-05-30 ENCOUNTER — Ambulatory Visit (INDEPENDENT_AMBULATORY_CARE_PROVIDER_SITE_OTHER): Admitting: Family Medicine

## 2024-05-30 VITALS — BP 112/76 | HR 115 | Wt 211.2 lb

## 2024-05-30 DIAGNOSIS — Z98891 History of uterine scar from previous surgery: Secondary | ICD-10-CM

## 2024-05-30 DIAGNOSIS — R7989 Other specified abnormal findings of blood chemistry: Secondary | ICD-10-CM | POA: Diagnosis not present

## 2024-05-30 DIAGNOSIS — Z6841 Body Mass Index (BMI) 40.0 and over, adult: Secondary | ICD-10-CM | POA: Diagnosis not present

## 2024-05-30 DIAGNOSIS — O0992 Supervision of high risk pregnancy, unspecified, second trimester: Secondary | ICD-10-CM

## 2024-05-30 DIAGNOSIS — Z9884 Bariatric surgery status: Secondary | ICD-10-CM

## 2024-05-30 DIAGNOSIS — O0993 Supervision of high risk pregnancy, unspecified, third trimester: Secondary | ICD-10-CM | POA: Diagnosis not present

## 2024-05-30 DIAGNOSIS — O99013 Anemia complicating pregnancy, third trimester: Secondary | ICD-10-CM

## 2024-05-30 DIAGNOSIS — O10913 Unspecified pre-existing hypertension complicating pregnancy, third trimester: Secondary | ICD-10-CM

## 2024-05-30 DIAGNOSIS — Z3A29 29 weeks gestation of pregnancy: Secondary | ICD-10-CM

## 2024-05-30 DIAGNOSIS — O99019 Anemia complicating pregnancy, unspecified trimester: Secondary | ICD-10-CM

## 2024-05-30 DIAGNOSIS — O10912 Unspecified pre-existing hypertension complicating pregnancy, second trimester: Secondary | ICD-10-CM

## 2024-05-30 NOTE — Progress Notes (Signed)
 PRENATAL VISIT NOTE  Subjective:  Colleen Fields is a 33 y.o. [phone] at [redacted]w[redacted]d being seen today for ongoing prenatal care.  She is currently monitored for the following issues for this high-risk pregnancy and has History of cesarean delivery followed by successful vaginal birth (VBAC); History of VBAC; Obstructive sleep apnea on CPAP; BMI 40.0-44.9, adult (HCC); Blood product declined; History of severe pre-eclampsia; S/P laparoscopic sleeve gastrectomy; Supervision of high risk pregnancy, antepartum, second trimester; Chronic hypertension with exacerbation during pregnancy in second trimester; Obesity in pregnancy; History of gestational diabetes mellitus (GDM) in prior pregnancy, currently pregnant; Abnormal glucose affecting pregnancy; Low vitamin D  level; Blood transfusion declined due to reasons of conscience (JW); and Anemia during pregnancy on their problem list.  Patient reports no complaints.   . Vag. Bleeding: None.  Movement: Present. Denies leaking of fluid.   The following portions of the patient's history were reviewed and updated as appropriate: allergies, current medications, past family history, past medical history, past social history, past surgical history and problem list.   Objective:   Vitals:   05/30/24 1121  BP: 112/76  Pulse: (!) 115  Weight: 211 lb 3.2 oz (95.8 kg)    Fetal Status:  Fetal Heart Rate (bpm): 160   Movement: Present    General: Alert, oriented and cooperative. Patient is in no acute distress.  Skin: Skin is warm and dry. No rash noted.   Cardiovascular: Normal heart rate noted  Respiratory: Normal respiratory effort, no problems with respiration noted  Abdomen: Soft, gravid, appropriate for gestational age.  Pain/Pressure: Present     Pelvic: Cervical exam deferred        Extremities: Normal range of motion.  Edema: None  Mental Status: Normal mood and affect. Normal behavior. Normal judgment and thought content.      09/23/2020    9:13  AM 09/12/2020    2:13 PM 08/22/2020    8:19 AM  Depression screen PHQ 2/9  Decreased Interest 1 3 2   Down, Depressed, Hopeless 1 1 2   PHQ - 2 Score 2 4 4   Altered sleeping 1 0 2  Tired, decreased energy 1 3 2   Change in appetite 1 0 3  Feeling bad or failure about yourself  1 0 3  Trouble concentrating 1 1 1   Moving slowly or fidgety/restless 1 2 0  Suicidal thoughts 1 0 0  PHQ-9 Score 9  10  15       Data saved with a previous flowsheet row definition        09/23/2020    9:13 AM 09/12/2020    2:14 PM 08/22/2020    8:20 AM 08/14/2020    3:59 PM  GAD 7 : Generalized Anxiety Score  Nervous, Anxious, on Edge 1 2 3 3   Control/stop worrying 2 1 2 3   Worry too much - different things 2 2 3 3   Trouble relaxing 1 0 3 0  Restless 0 0 0 0  Easily annoyed or irritable 2 1 3 3   Afraid - awful might happen 0 0 1 0  Total GAD 7 Score 8 6 15 12     Assessment and Plan:  Pregnancy: G4P3003 at [redacted]w[redacted]d 1. Supervision of high risk pregnancy, antepartum, second trimester (Primary) Continue prenatal care. 28 week labs today  2. History of cesarean delivery followed by successful vaginal birth (VBAC) X2, consent completed today Counseling last visit, see note  3. History of VBAC   4. BMI 40.0-44.9, adult (HCC) Normal growth  5. Low vitamin D  level H/o gastric sleeve On weekly repletion, assess level - VITAMIN D  25 Hydroxy (Vit-D Deficiency, Fractures)  6. Anemia during pregnancy Getting IV iron  infusions Repeat anemia panel - Vitamin B12 - Folate - Iron  and TIBC - Ferritin  7. Chronic hypertension with exacerbation during pregnancy in second trimester On no meds BP is well controlled EFW 61% Not taking ASA  8. S/P laparoscopic sleeve gastrectomy Labs today  Preterm labor symptoms and general obstetric precautions including but not limited to vaginal bleeding, contractions, leaking of fluid and fetal movement were reviewed in detail with the patient. Please refer to After  Visit Summary for other counseling recommendations.   Return in 2 weeks (on 06/13/2024) for HRC, in 5 weeks, begin weekly BPPs.  Future Appointments  Date Time Provider Department Center  06/19/2024  2:15 PM [facility]  06/19/2024  3:15 PM Hoyle Garre, NP [facility]    Glenys GORMAN Birk, MD

## 2024-05-30 NOTE — Progress Notes (Signed)
 VBAC consent signed and scanned into chart.   Vernell RN 05/30/24

## 2024-05-31 ENCOUNTER — Ambulatory Visit: Payer: Self-pay | Admitting: Family Medicine

## 2024-05-31 ENCOUNTER — Encounter: Payer: Self-pay | Admitting: *Deleted

## 2024-05-31 ENCOUNTER — Other Ambulatory Visit: Payer: Self-pay | Admitting: *Deleted

## 2024-05-31 DIAGNOSIS — O0992 Supervision of high risk pregnancy, unspecified, second trimester: Secondary | ICD-10-CM

## 2024-05-31 DIAGNOSIS — O24419 Gestational diabetes mellitus in pregnancy, unspecified control: Secondary | ICD-10-CM | POA: Insufficient documentation

## 2024-05-31 DIAGNOSIS — O24410 Gestational diabetes mellitus in pregnancy, diet controlled: Secondary | ICD-10-CM

## 2024-05-31 LAB — CBC
Hematocrit: 29.3 % — ABNORMAL LOW (ref 34.0–46.6)
Hemoglobin: 9.9 g/dL — ABNORMAL LOW (ref 11.1–15.9)
MCH: 32.1 pg (ref 26.6–33.0)
MCHC: 33.8 g/dL (ref 31.5–35.7)
MCV: 95 fL (ref 79–97)
Platelets: 206 x10E3/uL (ref 150–450)
RBC: 3.08 x10E6/uL — ABNORMAL LOW (ref 3.77–5.28)
RDW: 14.5 % (ref 11.7–15.4)
WBC: 4.5 x10E3/uL (ref 3.4–10.8)

## 2024-05-31 LAB — GLUCOSE TOLERANCE, 2 HOURS W/ 1HR
Glucose, 1 hour: 210 mg/dL — ABNORMAL HIGH (ref 70–179)
Glucose, 2 hour: 121 mg/dL (ref 70–152)
Glucose, Fasting: 94 mg/dL — ABNORMAL HIGH (ref 70–91)

## 2024-05-31 LAB — IRON AND TIBC
Iron Saturation: 33 % (ref 15–55)
Iron: 132 ug/dL (ref 27–159)
Total Iron Binding Capacity: 406 ug/dL (ref 250–450)
UIBC: 274 ug/dL (ref 131–425)

## 2024-05-31 LAB — FOLATE: Folate: >20 ng/mL (ref >3.0)

## 2024-05-31 LAB — FERRITIN: Ferritin: 331 ng/mL — ABNORMAL HIGH (ref 15–150)

## 2024-05-31 LAB — RPR: RPR Ser Ql: NONREACTIVE

## 2024-05-31 LAB — HIV ANTIBODY (ROUTINE TESTING W REFLEX): HIV Screen 4th Generation wRfx: NONREACTIVE

## 2024-05-31 LAB — VITAMIN D 25 HYDROXY (VIT D DEFICIENCY, FRACTURES): Vit D, 25-Hydroxy: 34.6 ng/mL (ref 30.0–100.0)

## 2024-05-31 NOTE — Telephone Encounter (Signed)
-----   Message from Glenys GORMAN Birk sent at 05/31/2024  4:25 PM EST ----- Has GDM--please send in supplies, refer to Diabetes education ----- Message ----- From: Rebecka Memos Lab Results In Sent: 05/31/2024   6:38 AM EST To: Glenys GORMAN Birk, MD

## 2024-05-31 NOTE — Telephone Encounter (Signed)
 I called [facility] and heard a message voicemail is full. I scheduled for first available appointment 06/13/24 at 8:15 and will send mychart message to patient. I did not send in supplies as not sure what is covered by her insurance.   Clinical staff can call again if patient does not read message. Burt Piatek,RN

## 2024-06-01 MED ORDER — ACCU-CHEK SOFTCLIX LANCETS MISC: 12 refills | Status: DC

## 2024-06-01 MED ORDER — ACCU-CHEK GUIDE TEST VI STRP: ORAL_STRIP | 12 refills | Status: DC

## 2024-06-01 MED ORDER — ACCU-CHEK GUIDE W/DEVICE KIT: 1.0000 | PACK | 0 refills | Status: DC | PRN

## 2024-06-01 NOTE — Telephone Encounter (Addendum)
-----   Message from Glenys GORMAN Birk sent at 05/31/2024  4:25 PM EST ----- Has GDM--please send in supplies, refer to Diabetes education ----- Message ----- From: Interface, [facility] Lab Results In Sent: 05/31/2024   6:38 AM EST To: Glenys GORMAN Birk, MD  Pt notified of results, diabetes education appt on 12/2, and to please pick up and bring her testing supplies to her diabetes education appt.  Pt verbalized understanding.   Loukas Antonson,RN  06/01/24

## 2024-06-01 NOTE — Addendum Note (Signed)
 Addended by: DAUNE Kinloch HERO on: 06/01/2024 09:39 AM   Modules accepted: Orders

## 2024-06-05 ENCOUNTER — Other Ambulatory Visit: Payer: Self-pay

## 2024-06-05 ENCOUNTER — Encounter (HOSPITAL_COMMUNITY): Payer: Self-pay | Admitting: Obstetrics and Gynecology

## 2024-06-05 ENCOUNTER — Inpatient Hospital Stay (HOSPITAL_COMMUNITY)
Admission: AD | Admit: 2024-06-05 | Discharge: 2024-06-05 | Disposition: A | Discharge status: Home / Self Care | Attending: Obstetrics and Gynecology | Admitting: Obstetrics and Gynecology

## 2024-06-05 DIAGNOSIS — O26893 Other specified pregnancy related conditions, third trimester: Secondary | ICD-10-CM | POA: Insufficient documentation

## 2024-06-05 DIAGNOSIS — Z3A30 30 weeks gestation of pregnancy: Secondary | ICD-10-CM | POA: Diagnosis not present

## 2024-06-05 DIAGNOSIS — O99013 Anemia complicating pregnancy, third trimester: Secondary | ICD-10-CM | POA: Diagnosis not present

## 2024-06-05 DIAGNOSIS — R102 Pelvic and perineal pain unspecified side: Secondary | ICD-10-CM | POA: Insufficient documentation

## 2024-06-05 HISTORY — DX: Type 2 diabetes mellitus without complications: E11.9

## 2024-06-05 LAB — URINALYSIS, ROUTINE W REFLEX MICROSCOPIC
Bilirubin Urine: NEGATIVE
Glucose, UA: NEGATIVE mg/dL
Hgb urine dipstick: NEGATIVE
Ketones, ur: NEGATIVE mg/dL
Nitrite: NEGATIVE
Protein, ur: 30 mg/dL — AB
Specific Gravity, Urine: 1.025 (ref 1.005–1.030)
pH: 6 (ref 5.0–8.0)

## 2024-06-05 LAB — CBC
HCT: 28.5 % — ABNORMAL LOW (ref 36.0–46.0)
Hemoglobin: 9.9 g/dL — ABNORMAL LOW (ref 12.0–15.0)
MCH: 31.5 pg (ref 26.0–34.0)
MCHC: 34.7 g/dL (ref 30.0–36.0)
MCV: 90.8 fL (ref 80.0–100.0)
Platelets: 207 K/uL (ref 150–400)
RBC: 3.14 MIL/uL — ABNORMAL LOW (ref 3.87–5.11)
RDW: 13.6 % (ref 11.5–15.5)
WBC: 5.3 K/uL (ref 4.0–10.5)
nRBC: 0.4 % — ABNORMAL HIGH (ref 0.0–0.2)

## 2024-06-05 LAB — COMPREHENSIVE METABOLIC PANEL WITH GFR
ALT: 16 U/L (ref 0–44)
AST: 13 U/L — ABNORMAL LOW (ref 15–41)
Albumin: 2.7 g/dL — ABNORMAL LOW (ref 3.5–5.0)
Alkaline Phosphatase: 38 U/L (ref 38–126)
Anion gap: 10 (ref 5–15)
BUN: 9 mg/dL (ref 6–20)
CO2: 21 mmol/L — ABNORMAL LOW (ref 22–32)
Calcium: 9.1 mg/dL (ref 8.9–10.3)
Chloride: 105 mmol/L (ref 98–111)
Creatinine, Ser: 0.49 mg/dL (ref 0.44–1.00)
GFR, Estimated: >60 mL/min (ref >60)
Glucose, Bld: 96 mg/dL (ref 70–99)
Potassium: 4 mmol/L (ref 3.5–5.1)
Sodium: 136 mmol/L (ref 135–145)
Total Bilirubin: 0.6 mg/dL (ref 0.0–1.2)
Total Protein: 6.1 g/dL — ABNORMAL LOW (ref 6.5–8.1)

## 2024-06-05 LAB — WET PREP, GENITAL
Clue Cells Wet Prep HPF POC: NONE SEEN
Sperm: NONE SEEN
Trich, Wet Prep: NONE SEEN
WBC, Wet Prep HPF POC: <10 (ref <10)
Yeast Wet Prep HPF POC: NONE SEEN

## 2024-06-05 NOTE — MAU Note (Signed)
 Colleen Fields is a 33 y.o. at [redacted]w[redacted]d here in MAU reporting: she's been cramping since last Sunday and worsens at night.  States cramping feels like when on Pitocin .  Denies VB and LOF.  Endorses +FM.  LMP: 10/28/2023 Onset of complaint: last Sunday Pain score: 7 Vitals:   06/05/24 1450  BP: 111/71  Pulse: 96  Resp: 19  Temp: 97.8 F (36.6 C)  SpO2: 100%     FHT: 156 bpm  Lab orders placed from triage: UA

## 2024-06-05 NOTE — MAU Provider Note (Cosign Needed Addendum)
 MATERNITY ASSESSMENT UNIT  Chief Complaint:  Cramping   HPI   Reports cramping felt primarily in her pelvis/vagina for last 8 days.  Symptoms worse at night.  Was told by her primary OB this was likely round ligament pain but to come to be evaluated if it did not resolve.  Denies abdominal pain.  Denies bleeding, LOF, RUQ pain, HA, new edema.  Feeling baby move.  Denies dysuria, increased discharge, vaginal itching.   Pregnancy Course: Receives care at [facility] for [facility] . Prenatal records reviewed.   Past Medical History:  Diagnosis Date   Anxiety    Anxiety and depression 12/09/2017   Chest pain    Complication of anesthesia    1st eipidural -didn't get relief from spinal- had severe pain during procedure, couldn't see   Depression    Diabetes mellitus without complication (HCC)    gestational- diet   Dysrhythmia    palpitations   History of gestational diabetes mellitus 09/05/2017   [ ]  qyear a1c  09/2020: passed 2h post partum GTT   History of migraine 09/12/2020   History of severe pre-eclampsia 09/02/2020   Postpartum readmission for MGSO4   Migraine    Obesity 07/22/2017   Pre-diabetes    Tachycardia    OB History  Gravida Para Term Preterm AB Living  4 3 3  0  3  SAB IAB Ectopic Multiple Live Births     0 3    # Outcome Date GA Lbr Len/2nd Weight Sex Type Anes PTL Lv  4 Current           3 Term 08/24/20 [redacted]w[redacted]d 15:02 / 00:03 2946 g M Vag-Spont EPI  LIV     Birth Comments: GDM, IOL due to GDM  2 Term 01/23/18 [redacted]w[redacted]d / 00:17 3206 g M VBAC EPI  LIV     Birth Comments: GDM  1 Term 12/03/09 [redacted]w[redacted]d  3062 g F CS-LTranv Spinal  LIV     Birth Comments: wnl; IOL due decel in FHR at MD office, then c/s due to [facility]     Complications: Failure to Progress in First Stage   Past Surgical History:  Procedure Laterality Date   CESAREAN SECTION     LAPAROSCOPIC GASTRIC SLEEVE RESECTION N/A 08/04/2022   Procedure: LAPAROSCOPIC SLEEVE GASTRECTOMY;   Surgeon: Tanda Locus, MD;  Location: [facility];  Service: General;  Laterality: N/A;   UPPER GI ENDOSCOPY N/A 08/04/2022   Procedure: UPPER GI ENDOSCOPY;  Surgeon: Tanda Locus, MD;  Location: [facility];  Service: General;  Laterality: N/A;   WISDOM TOOTH EXTRACTION     Family History  Problem Relation Age of Onset   Diabetes Mother    Hypertension Mother    Bipolar disorder Father    Schizophrenia Father    Hypertension Sister    Fibroids Sister    Breast cancer Maternal Aunt    Sleep apnea Maternal Uncle    Social History   Tobacco Use   Smoking status: Never   Smokeless tobacco: Never  Vaping Use   Vaping status: Never Used  Substance Use Topics   Alcohol use: Not Currently    Comment: occasionally   Drug use: Not Currently    Types: Marijuana    Comment: hasn't smoked in forever  only at age 12   Allergies  Allergen Reactions   Latex Itching and Other (See Comments)    Only occurs when exposed over a long duration, no respiratory issues   Medications  Prior to Admission  Medication Sig Dispense Refill Last Dose/Taking   diphenhydrAMINE  (BENADRYL ) 50 MG capsule Take 50 mg by mouth at bedtime as needed.   06/04/2024   ferrous sulfate  324 MG TBEC Take 1 tablet (324 mg total) by mouth every other day. 90 tablet 2 06/04/2024   Prenatal Vit-Fe Fumarate-FA (PREPLUS) 27-1 MG TABS Take 1 tablet by mouth daily.   06/05/2024   Accu-Chek Softclix Lancets lancets Use as instructed 100 each 12    aspirin  81 MG chewable tablet Chew 1 tablet (81 mg total) by mouth daily. (Patient not taking: Reported on 05/30/2024) 90 tablet 2    Blood Glucose Monitoring Suppl (ACCU-CHEK GUIDE) w/Device KIT 1 Device by Does not apply route as needed. O24.419  Check blood sugar qid 1 kit 0    Blood Pressure Monitoring (BLOOD PRESSURE KIT) DEVI 1 Device by Does not apply route as needed. 1 each 0    glucose blood (ACCU-CHEK GUIDE TEST) test strip Use as instructed 100 each 12     I have reviewed  patient's Past Medical Hx, Surgical Hx, Family Hx, Social Hx, medications and allergies.   ROS  Pertinent items noted in HPI and remainder of comprehensive ROS otherwise negative.   PHYSICAL EXAM  Patient Vitals for the past 24 hrs:  BP Temp Temp src Pulse Resp SpO2 Height Weight  06/05/24 1610 (!) 103/59 -- -- 96 17 -- -- --  06/05/24 1534 (!) 93/52 -- -- 93 18 100 % -- --  06/05/24 1450 111/71 97.8 F (36.6 C) Oral 96 19 100 % -- --  06/05/24 1445 -- -- -- -- -- -- 4' 11 (1.499 m) 95.8 kg    Physical Exam Constitutional:      Appearance: Normal appearance. She is obese.  HENT:     Head: Normocephalic and atraumatic.  Eyes:     Extraocular Movements: Extraocular movements intact.  Pulmonary:     Effort: Pulmonary effort is normal. No respiratory distress.  Abdominal:     Palpations: Abdomen is soft.     Tenderness: There is no abdominal tenderness. There is no right CVA tenderness, left CVA tenderness, guarding or rebound.  Musculoskeletal:     Right lower leg: No edema.     Left lower leg: No edema.  Skin:    General: Skin is warm and dry.  Neurological:     General: No focal deficit present.     Mental Status: She is alert.  Psychiatric:        Mood and Affect: Mood normal.        Behavior: Behavior normal.    Cervical exam: Dilation: Fingertip Exam by:: Dr Glennon  External os is 1cm.  Unable to palpate internal os.  Patient preferred not to have attending physician perform second cervical exam.  Fetal Tracing: Baseline FHR: 160 per minute Fetal heart variability: moderate Fetal Heart Rate accelerations: yes Fetal Heart Rate decelerations: none Fetal Non-stress Test: Category I (reactive) Toco:  Cat I (baseline 110-160 bpm, moderate FHR 6-25 bpm, no late or variable decelerations, may have accelerations or early decelerations) Cat III (no variability, baseline <110 bpm, recurrent late decelerations, recurrent variable decelerations, sinusoidal pattern) Cat  II (indeterminate)  Labs: Results for orders placed or performed during the hospital encounter of 06/05/24 (from the past 24 hours)  CBC     Status: Abnormal   Collection Time: 06/05/24  4:09 PM  Result Value Ref Range   WBC 5.3 4.0 - 10.5 K/uL  RBC 3.14 (L) 3.87 - 5.11 MIL/uL   Hemoglobin 9.9 (L) 12.0 - 15.0 g/dL   HCT 71.4 (L) 63.9 - 53.9 %   MCV 90.8 80.0 - 100.0 fL   MCH 31.5 26.0 - 34.0 pg   MCHC 34.7 30.0 - 36.0 g/dL   RDW 86.3 88.4 - 84.4 %   Platelets 207 150 - 400 K/uL   nRBC 0.4 (H) 0.0 - 0.2 %  Comprehensive metabolic panel     Status: Abnormal   Collection Time: 06/05/24  4:09 PM  Result Value Ref Range   Sodium 136 135 - 145 mmol/L   Potassium 4.0 3.5 - 5.1 mmol/L   Chloride 105 98 - 111 mmol/L   CO2 21 (L) 22 - 32 mmol/L   Glucose, Bld 96 70 - 99 mg/dL   BUN 9 6 - 20 mg/dL   Creatinine, Ser 9.50 0.44 - 1.00 mg/dL   Calcium  9.1 8.9 - 10.3 mg/dL   Total Protein 6.1 (L) 6.5 - 8.1 g/dL   Albumin 2.7 (L) 3.5 - 5.0 g/dL   AST 13 (L) 15 - 41 U/L   ALT 16 0 - 44 U/L   Alkaline Phosphatase 38 38 - 126 U/L   Total Bilirubin 0.6 0.0 - 1.2 mg/dL   GFR, Estimated >39 >39 mL/min   Anion gap 10 5 - 15  Urinalysis, Routine w reflex microscopic -Urine, Clean Catch     Status: Abnormal   Collection Time: 06/05/24  4:24 PM  Result Value Ref Range   Color, Urine YELLOW YELLOW   APPearance HAZY (A) CLEAR   Specific Gravity, Urine 1.025 1.005 - 1.030   pH 6.0 5.0 - 8.0   Glucose, UA NEGATIVE NEGATIVE mg/dL   Hgb urine dipstick NEGATIVE NEGATIVE   Bilirubin Urine NEGATIVE NEGATIVE   Ketones, ur NEGATIVE NEGATIVE mg/dL   Protein, ur 30 (A) NEGATIVE mg/dL   Nitrite NEGATIVE NEGATIVE   Leukocytes,Ua SMALL (A) NEGATIVE   RBC / HPF 0-5 0 - 5 RBC/hpf   WBC, UA 21-50 0 - 5 WBC/hpf   Bacteria, UA FEW (A) NONE SEEN   Squamous Epithelial / HPF 6-10 0 - 5 /HPF   Mucus PRESENT   Wet prep, genital     Status: None   Collection Time: 06/05/24  4:27 PM   Specimen: PATH Cytology  Cervicovaginal Ancillary Only  Result Value Ref Range   Yeast Wet Prep HPF POC NONE SEEN NONE SEEN   Trich, Wet Prep NONE SEEN NONE SEEN   Clue Cells Wet Prep HPF POC NONE SEEN NONE SEEN   WBC, Wet Prep HPF POC <10 <10   Sperm NONE SEEN     Imaging:  No results found.  MDM & MAU COURSE  MDM: Moderate - high risk pregnancy  MAU Course: Admitted complaining of cramping, stable vitals NST reactive UA unremarkable Wet prep negative  Differential diagnosis considered for abdominal pain includes but is not limited to: round ligament pain, labor, UTI, pyelonephritis, PID, cervicitis, placental abruption, chorioamnionitis   Orders Placed This Encounter  Procedures   Wet prep, genital   Urinalysis, Routine w reflex microscopic -Urine, Clean Catch   CBC   Comprehensive metabolic panel   No orders of the defined types were placed in this encounter.   ASSESSMENT   33yo G4P3 at [redacted]w[redacted]d presenting with constant pelvic pain for 8 days.  Vitals wnl.  Reactive NST.  Labs largely unrevealing except for known anemia which is stable.  Cervical exam documented above.  Suspect round ligament pain vs physiologic changes of pregnancy.  Discussed return precautions including signs of labor.  PLAN  Discharge home in stable condition with return precautions.    Follow-up Information     [facility] for [facility]. Schedule an appointment as soon as possible for a visit in 1 week(s).   Specialty: Obstetrics and Gynecology Contact information: [address] [place]  [address] [phone]              Follow up with [facility] provider as scheduled.  Next appt 12/2 at 8:15.   Allergies as of 06/05/2024       Reactions   Latex Itching, Other (See Comments)   Only occurs when exposed over a long duration, no respiratory issues        Medication List     TAKE these medications    Accu-Chek Guide Test test strip Generic drug:  glucose blood Use as instructed   Accu-Chek Guide w/Device Kit 1 Device by Does not apply route as needed. O24.419  Check blood sugar qid   Accu-Chek Softclix Lancets lancets Use as instructed   aspirin  81 MG chewable tablet Chew 1 tablet (81 mg total) by mouth daily.   Blood Pressure Kit Devi 1 Device by Does not apply route as needed.   diphenhydrAMINE  50 MG capsule Commonly known as: BENADRYL  Take 50 mg by mouth at bedtime as needed.   ferrous sulfate  324 MG Tbec Take 1 tablet (324 mg total) by mouth every other day.   PrePLUS 27-1 MG Tabs Take 1 tablet by mouth daily.        Paralee JONELLE Carpen, MD  Family Medicine PGY-1  06/05/2024 5:54 PM

## 2024-06-06 LAB — GC/CHLAMYDIA PROBE AMP (~~LOC~~) NOT AT ARMC
Chlamydia: NEGATIVE
Comment: NEGATIVE
Comment: NORMAL
Neisseria Gonorrhea: NEGATIVE

## 2024-06-07 ENCOUNTER — Other Ambulatory Visit: Payer: Self-pay

## 2024-06-07 DIAGNOSIS — O24410 Gestational diabetes mellitus in pregnancy, diet controlled: Secondary | ICD-10-CM

## 2024-06-12 ENCOUNTER — Ambulatory Visit: Payer: Self-pay | Admitting: Obstetrics and Gynecology

## 2024-06-13 ENCOUNTER — Ambulatory Visit: Admitting: Dietician

## 2024-06-13 ENCOUNTER — Encounter: Attending: Family Medicine | Admitting: Dietician

## 2024-06-13 ENCOUNTER — Other Ambulatory Visit: Payer: Self-pay

## 2024-06-13 DIAGNOSIS — Z713 Dietary counseling and surveillance: Secondary | ICD-10-CM | POA: Insufficient documentation

## 2024-06-13 DIAGNOSIS — O24410 Gestational diabetes mellitus in pregnancy, diet controlled: Secondary | ICD-10-CM | POA: Insufficient documentation

## 2024-06-13 DIAGNOSIS — Z3A32 32 weeks gestation of pregnancy: Secondary | ICD-10-CM | POA: Insufficient documentation

## 2024-06-13 NOTE — Progress Notes (Addendum)
 Patient was seen for Gestational Diabetes During Pregnancy) on 06/13/2024  Start time 0825 and End time 0905 Patient logged in to her work computer at arrival and was somewhat distracted. Patient has had previous GDM but states that she does not remember most of this.  Stress is very high.  Provided a Dexcom sample, placed on her arm and paired with her phone.  Will request prescription.  She likely will need a prior authorization.  Discussed with patient.  Estimated due date: 08/12/2023; [redacted]w[redacted]d   Clinical: Medications: prenatal vitamin Medical History: GDM (x3), prediabetes Labs: OGTT fasting 94, 1 hour 210, 2 hour 121 on 05/30/2024, A1c 5.7% on 02/21/2024  Dietary and Lifestyle History: Patient lives with her husband and 3 children.  They share shopping and cooking.  She works from home.  Physical Activity: Gym 3 times per week and walking. Stress: very high Sleep: poor, increased pain  24 hr Recall:  First Meal:  protein shake ([employer]) OR shrimp, potatoes, steak Snack:  none Second meal:  none Snack:  protein shake Third meal:  mac and cheese, shrimp, potatoes, steak Snack:  none Beverages:  water , lite lemonade  NUTRITION INTERVENTION  Nutrition education (E-1) on the following topics:   Initial Follow-up  []  []  Definition of Gestational Diabetes [x]  []  Why dietary management is important in controlling blood glucose Poorly controlled diabetes can lead to fetal macrosomia, shoulder dystocia and neurological injuries, stillbirth and neonatal complications including respiratory distress syndrome, hypoglycemia and prolonged NICU admission.  [x]  []  Effects each nutrient has on blood glucose levels []  []  Simple carbohydrates vs complex carbohydrates []  []  Fluid intake [x]  []  Creating a balanced meal plan []  []  Carbohydrate counting  [x]  []  When to check blood glucose levels [x]  []  Proper blood glucose monitoring techniques [x]  []  Effect of stress and stress reduction  techniques  [x]  []  Exercise effect on blood glucose levels, appropriate exercise during pregnancy []  []  Importance of limiting caffeine  and abstaining from alcohol and smoking [x]  []  Medications used for blood sugar control during pregnancy [x]  []  Hypoglycemia and rule of 15 [x]  []  Postpartum self care   Patient has a meter prior to visit but needs to pick up the supplies. Patient is not testing pre breakfast and 2 hours after each meal.  Postprandial: 106 1 hour after a protein shake in office  Dexcom G7 provided.  Lot [id number], Expiration 03/12/2025.  Patient instructed to monitor glucose levels: FBS: 60 - <= 95 mg/dL; 2 hour: <= 879 mg/dL  Patient received handouts: Nutrition Diabetes and Pregnancy Carbohydrate Counting List Blood glucose log Snack ideas for diabetes during pregnancy  Patient will be seen for follow-up as needed.

## 2024-06-16 ENCOUNTER — Ambulatory Visit

## 2024-06-16 ENCOUNTER — Ambulatory Visit: Attending: Obstetrics and Gynecology

## 2024-06-16 VITALS — BP 109/70

## 2024-06-16 DIAGNOSIS — O10912 Unspecified pre-existing hypertension complicating pregnancy, second trimester: Secondary | ICD-10-CM

## 2024-06-16 DIAGNOSIS — O24410 Gestational diabetes mellitus in pregnancy, diet controlled: Secondary | ICD-10-CM

## 2024-06-16 DIAGNOSIS — O09299 Supervision of pregnancy with other poor reproductive or obstetric history, unspecified trimester: Secondary | ICD-10-CM

## 2024-06-16 DIAGNOSIS — O99210 Obesity complicating pregnancy, unspecified trimester: Secondary | ICD-10-CM

## 2024-06-16 DIAGNOSIS — O0992 Supervision of high risk pregnancy, unspecified, second trimester: Secondary | ICD-10-CM

## 2024-06-16 DIAGNOSIS — Z3A32 32 weeks gestation of pregnancy: Secondary | ICD-10-CM

## 2024-06-16 MED ORDER — DEXCOM G7 SENSOR MISC: 1.0000 [IU] | 11 refills | Status: DC

## 2024-06-16 MED ORDER — DEXCOM G7 RECEIVER DEVI: 1.0000 [IU] | 11 refills | Status: DC

## 2024-06-16 NOTE — Progress Notes (Signed)
 CBG's Fasting: : pt reports highest in past week 101             2hours After meals :  highest reading per pt was 158

## 2024-06-16 NOTE — Progress Notes (Signed)
 Patient information  Patient Name: Colleen Fields  Patient MRN:   [id number]  Referring practice: MFM Referring Provider: [facility])  Problem List   Patient Active Problem List   Diagnosis Date Noted   Gestational diabetes 05/31/2024   Anemia during pregnancy 04/11/2024   Blood transfusion declined due to reasons of conscience (JW) 03/27/2024   Abnormal glucose affecting pregnancy 02/28/2024   Low vitamin D  level 02/28/2024   Supervision of high risk pregnancy, antepartum, second trimester 02/09/2024   Chronic hypertension with exacerbation during pregnancy in second trimester 02/09/2024   Obesity in pregnancy 02/09/2024   History of gestational diabetes mellitus (GDM) in prior pregnancy, currently pregnant 02/09/2024   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 [id number] at [redacted]w[redacted]d here for ultrasound and consultation. Colleen Fields is doing well today with no acute concerns. Today we focused on the following:   The patient is being seen today for a BPP due to diet-controlled gestational diabetes, hypertension, and elevated BMI. She reports that her fasting blood sugars are mostly normal, but her 2-hour postprandial values range from 120-150 mg/dL. I discussed the importance of maintaining appropriate diet and exercise during pregnancy to help minimize hyperglycemia. We reviewed the potential clinical implications of persistent elevated glucose levels, including adverse neonatal outcomes and the possibility of requiring medication. I counseled her that if more than 25% of her values are above goal,  she should consider medical therapy, with insulin  as the first-line option to reduce obstetric risks. The patient will document her blood sugars and bring her log to her OB provider for improved assessment and determination of her overall level of control.  Her blood pressure is normal today. Preeclampsia precautions were reviewed.  Today's ultrasound showed a BPP of 8/8.  The patient had time to ask questions, which were answered to her satisfaction. She verbalized understanding and agrees to proceed with the plan below. Standard OB precautions were given.  Recommendations - 1 more week of diet and exercise to attempt to improve blood glucose levels.  If 25% or more not at goal then she should start either insulin  or metformin if she does not want to try insulin . -Continue weekly antenatal testing.  NST next week with a BPP and growth week after. -Delivery likely around 37 to 38 weeks pending clinical course  I spent 30 minutes reviewing the patients chart, including labs and images as well as counseling the patient about her medical conditions. Greater than 50% of the time was spent in direct face-to-face patient counseling.  Delora Smaller  MFM, [place]   06/16/2024  3:21 PM   Review of Systems: A review of systems was performed and was negative except per HPI   Vitals and Physical Exam    06/16/2024    2:16 PM 06/05/2024    5:47 PM 06/05/2024    4:10 PM  Vitals with BMI  Systolic 109 131 896  Diastolic 70 77 59  Pulse  97 96    Sitting comfortably on the sonogram table Nonlabored breathing Normal rate and rhythm Abdomen is nontender  Past pregnancies OB  History  Gravida Para Term Preterm AB Living  4 3 3  0  3  SAB IAB Ectopic Multiple Live Births     0 3    # Outcome Date GA Lbr Len/2nd Weight Sex Type Anes PTL Lv  4 Current           3 Term 08/24/20 [redacted]w[redacted]d 15:02 / 00:03 6 lb 7.9 oz (2.946 kg) M Vag-Spont EPI  LIV     Birth Comments: GDM, IOL due to GDM  2 Term  01/23/18 [redacted]w[redacted]d / 00:17 7 lb 1.1 oz (3.206 kg) M VBAC EPI  LIV     Birth Comments: GDM  1 Term 12/03/09 [redacted]w[redacted]d  6 lb 12 oz (3.062 kg) F CS-LTranv Spinal  LIV     Birth Comments: wnl; IOL due decel in FHR at MD office, then c/s due to [facility]     Complications: Failure to Progress in First Stage     Future Appointments  Date Time Provider Department Center  06/19/2024  2:15 PM [facility] NST [facility]  06/19/2024  3:15 PM Hoyle Garre, NP [facility]

## 2024-06-16 NOTE — Addendum Note (Signed)
 Addended by: FREDIRICK GLENYS RAMAN on: 06/16/2024 11:50 AM   Modules accepted: Orders

## 2024-06-19 ENCOUNTER — Ambulatory Visit: Admitting: Student

## 2024-06-19 ENCOUNTER — Ambulatory Visit: Attending: Obstetrics and Gynecology

## 2024-06-19 ENCOUNTER — Other Ambulatory Visit: Payer: Self-pay

## 2024-06-19 VITALS — BP 132/84 | HR 94 | Wt 213.8 lb

## 2024-06-19 DIAGNOSIS — Z98891 History of uterine scar from previous surgery: Secondary | ICD-10-CM | POA: Diagnosis not present

## 2024-06-19 DIAGNOSIS — O99213 Obesity complicating pregnancy, third trimester: Secondary | ICD-10-CM | POA: Diagnosis not present

## 2024-06-19 DIAGNOSIS — O24414 Gestational diabetes mellitus in pregnancy, insulin controlled: Secondary | ICD-10-CM | POA: Diagnosis not present

## 2024-06-19 DIAGNOSIS — O10913 Unspecified pre-existing hypertension complicating pregnancy, third trimester: Secondary | ICD-10-CM

## 2024-06-19 DIAGNOSIS — O99013 Anemia complicating pregnancy, third trimester: Secondary | ICD-10-CM

## 2024-06-19 DIAGNOSIS — Z3A32 32 weeks gestation of pregnancy: Secondary | ICD-10-CM

## 2024-06-19 DIAGNOSIS — O24410 Gestational diabetes mellitus in pregnancy, diet controlled: Secondary | ICD-10-CM

## 2024-06-19 DIAGNOSIS — O99019 Anemia complicating pregnancy, unspecified trimester: Secondary | ICD-10-CM

## 2024-06-19 DIAGNOSIS — Z6841 Body Mass Index (BMI) 40.0 and over, adult: Secondary | ICD-10-CM | POA: Diagnosis not present

## 2024-06-19 DIAGNOSIS — O10912 Unspecified pre-existing hypertension complicating pregnancy, second trimester: Secondary | ICD-10-CM

## 2024-06-19 DIAGNOSIS — O0993 Supervision of high risk pregnancy, unspecified, third trimester: Secondary | ICD-10-CM

## 2024-06-19 DIAGNOSIS — O0990 Supervision of high risk pregnancy, unspecified, unspecified trimester: Secondary | ICD-10-CM

## 2024-06-19 NOTE — Progress Notes (Unsigned)
 PRENATAL VISIT NOTE  Subjective:  Colleen Fields is a 33 y.o. [phone] at [redacted]w[redacted]d being seen today for ongoing prenatal care.  She is currently monitored for the following issues for this high-risk pregnancy and has History of cesarean delivery followed by successful vaginal birth (VBAC); History of VBAC; Obstructive sleep apnea on CPAP; BMI 40.0-44.9, adult (HCC); Blood product declined; History of severe pre-eclampsia; S/P laparoscopic sleeve gastrectomy; Supervision of high risk pregnancy, antepartum, second trimester; Chronic hypertension with exacerbation during pregnancy in second trimester; Obesity in pregnancy; History of gestational diabetes mellitus (GDM) in prior pregnancy, currently pregnant; Abnormal glucose affecting pregnancy; Low vitamin D  level; Blood transfusion declined due to reasons of conscience (JW); Anemia during pregnancy; and Gestational diabetes on their problem list.  Patient reports no complaints.  Contractions: Not present. Vag. Bleeding: None.  Movement: Present. Denies leaking of fluid.   The following portions of the patient's history were reviewed and updated as appropriate: allergies, current medications, past family history, past medical history, past social history, past surgical history and problem list.   Objective:   Vitals:   06/19/24 1524  BP: 132/84  Pulse: 94  Weight: 213 lb 12.8 oz (97 kg)    Fetal Status:  Fetal Heart Rate (bpm): 141   Movement: Present    General: Alert, oriented and cooperative. Patient is in no acute distress.  Skin: Skin is warm and dry. No rash noted.   Cardiovascular: Normal heart rate noted  Respiratory: Normal respiratory effort, no problems with respiration noted  Abdomen: Soft, gravid, appropriate for gestational age.  Pain/Pressure: Present     Pelvic: {Blank single:19197::Cervical exam performed in the presence of a chaperone,Cervical exam deferred}        Extremities: Normal range of motion.  Edema: None   Mental Status: Normal mood and affect. Normal behavior. Normal judgment and thought content.      09/23/2020    9:13 AM 09/12/2020    2:13 PM 08/22/2020    8:19 AM  Depression screen PHQ 2/9  Decreased Interest 1 3 2   Down, Depressed, Hopeless 1 1 2   PHQ - 2 Score 2 4 4   Altered sleeping 1 0 2  Tired, decreased energy 1 3 2   Change in appetite 1 0 3  Feeling bad or failure about yourself  1 0 3  Trouble concentrating 1 1 1   Moving slowly or fidgety/restless 1 2 0  Suicidal thoughts 1 0 0  PHQ-9 Score 9  10  15       Data saved with a previous flowsheet row definition        09/23/2020    9:13 AM 09/12/2020    2:14 PM 08/22/2020    8:20 AM 08/14/2020    3:59 PM  GAD 7 : Generalized Anxiety Score  Nervous, Anxious, on Edge 1 2 3 3   Control/stop worrying 2 1 2 3   Worry too much - different things 2 2 3 3   Trouble relaxing 1 0 3 0  Restless 0 0 0 0  Easily annoyed or irritable 2 1 3 3   Afraid - awful might happen 0 0 1 0  Total GAD 7 Score 8 6 15 12     Assessment and Plan:  Pregnancy: G4P3003 at [redacted]w[redacted]d 1. Supervision of high risk pregnancy, antepartum (Primary) ***  2. [redacted] weeks gestation of pregnancy ***  3. Diet controlled gestational diabetes mellitus (GDM) in third trimester ***  4. BMI 40.0-44.9, adult (HCC) ***  5. History of VBAC ***  6. History of cesarean delivery followed by successful vaginal birth (VBAC) ***  7. Anemia during pregnancy ***  8. Chronic hypertension with exacerbation during pregnancy in second trimester ***  {Blank single:19197::Term,Preterm} labor symptoms and general obstetric precautions including but not limited to vaginal bleeding, contractions, leaking of fluid and fetal movement were reviewed in detail with the patient. Please refer to After Visit Summary for other counseling recommendations.   No follow-ups on file.  No future appointments.  Jessamine Barcia, NP

## 2024-06-19 NOTE — Procedures (Signed)
 Colleen Fields [birth year] [redacted]w[redacted]d  Fetus A Non-Stress Test Interpretation for 06/19/24-NST only  Indication: Chronic Hypertension and ges daib (diet controlled), obesity in preg  Fetal Heart Rate A Mode: External Baseline Rate (A): 145 bpm Variability: Moderate Accelerations: 15 x 15 Decelerations: None Multiple birth?: No  Uterine Activity Mode: Toco Contraction Frequency (min): none Resting Tone Palpated: Relaxed  Interpretation (Fetal Testing) Nonstress Test Interpretation: Reactive Comments: Tracing reviewed by Dr. Arna

## 2024-06-22 MED ORDER — INSULIN GLARGINE 100 UNIT/ML SOLOSTAR PEN: 10.0000 [IU] | PEN_INJECTOR | Freq: Every evening | SUBCUTANEOUS | 0 refills | Status: DC

## 2024-06-23 ENCOUNTER — Other Ambulatory Visit: Payer: Self-pay | Admitting: Obstetrics and Gynecology

## 2024-06-23 MED ORDER — INSULIN PEN NEEDLE 31G X 5 MM MISC: 1.0000 | Freq: Every day | 1 refills | Status: DC

## 2024-06-26 MED ORDER — INSULIN PEN NEEDLE 31G X 5 MM MISC: 1.0000 | Freq: Every day | 1 refills | Status: DC

## 2024-06-26 NOTE — Addendum Note (Signed)
 Addended by: ELBY WADDELL CROME on: 06/26/2024 11:39 AM   Modules accepted: Orders

## 2024-06-30 ENCOUNTER — Other Ambulatory Visit: Payer: Self-pay | Admitting: Maternal & Fetal Medicine

## 2024-06-30 ENCOUNTER — Other Ambulatory Visit: Payer: Self-pay | Admitting: *Deleted

## 2024-06-30 ENCOUNTER — Ambulatory Visit: Attending: Obstetrics and Gynecology

## 2024-06-30 ENCOUNTER — Other Ambulatory Visit: Payer: Self-pay | Admitting: Obstetrics and Gynecology

## 2024-06-30 ENCOUNTER — Ambulatory Visit: Admitting: Obstetrics and Gynecology

## 2024-06-30 VITALS — BP 122/67 | HR 95

## 2024-06-30 DIAGNOSIS — O10912 Unspecified pre-existing hypertension complicating pregnancy, second trimester: Secondary | ICD-10-CM

## 2024-06-30 DIAGNOSIS — O99210 Obesity complicating pregnancy, unspecified trimester: Secondary | ICD-10-CM

## 2024-06-30 DIAGNOSIS — O24414 Gestational diabetes mellitus in pregnancy, insulin controlled: Secondary | ICD-10-CM | POA: Diagnosis not present

## 2024-06-30 DIAGNOSIS — E66813 Obesity, class 3: Secondary | ICD-10-CM

## 2024-06-30 DIAGNOSIS — Z3A34 34 weeks gestation of pregnancy: Secondary | ICD-10-CM | POA: Diagnosis not present

## 2024-06-30 DIAGNOSIS — O10012 Pre-existing essential hypertension complicating pregnancy, second trimester: Secondary | ICD-10-CM

## 2024-06-30 DIAGNOSIS — Z3689 Encounter for other specified antenatal screening: Secondary | ICD-10-CM | POA: Insufficient documentation

## 2024-06-30 DIAGNOSIS — O0992 Supervision of high risk pregnancy, unspecified, second trimester: Secondary | ICD-10-CM

## 2024-06-30 DIAGNOSIS — Z8632 Personal history of gestational diabetes: Secondary | ICD-10-CM | POA: Insufficient documentation

## 2024-06-30 DIAGNOSIS — O10913 Unspecified pre-existing hypertension complicating pregnancy, third trimester: Secondary | ICD-10-CM

## 2024-06-30 DIAGNOSIS — E669 Obesity, unspecified: Secondary | ICD-10-CM | POA: Diagnosis not present

## 2024-06-30 DIAGNOSIS — O09299 Supervision of pregnancy with other poor reproductive or obstetric history, unspecified trimester: Secondary | ICD-10-CM

## 2024-06-30 DIAGNOSIS — O99843 Bariatric surgery status complicating pregnancy, third trimester: Secondary | ICD-10-CM | POA: Insufficient documentation

## 2024-06-30 DIAGNOSIS — O09293 Supervision of pregnancy with other poor reproductive or obstetric history, third trimester: Secondary | ICD-10-CM

## 2024-06-30 DIAGNOSIS — O99213 Obesity complicating pregnancy, third trimester: Secondary | ICD-10-CM | POA: Insufficient documentation

## 2024-06-30 DIAGNOSIS — O34219 Maternal care for unspecified type scar from previous cesarean delivery: Secondary | ICD-10-CM | POA: Insufficient documentation

## 2024-06-30 DIAGNOSIS — O10013 Pre-existing essential hypertension complicating pregnancy, third trimester: Secondary | ICD-10-CM | POA: Insufficient documentation

## 2024-06-30 NOTE — Progress Notes (Signed)
 Maternal-Fetal Medicine Consultation  Name: Colleen Fields  MRN: [id number]  GA: [id number] [redacted]w[redacted]d   Patient is here for fetal growth assessment and antenatal testing. -Chronic hypertension.  Well-controlled without antihypertensives.  Blood pressure today at our office is 122/67 mmHg.  Patient does not have signs and symptoms of severe features of preeclampsia. -Gestational diabetes.  Patient takes glargine insulin  10 units at night.  She is on continuous glucose monitoring.  She reports her fasting and postprandial levels are still not very well-controlled.  Fasting levels range between 84 and 111 mg/dL, and postprandial between 120 and 158 mg/dL.  Patient experiences occasional episodes of hypoglycemia. -Previous cesarean delivery followed by 2 VBAC's.  Patient is keen on vaginal delivery. -Jehovah's Witness.  Patient opts not to have blood transfusions.  Recent hemoglobin was 9.9%. - History of bariatric surgery. - Grade 3 obesity. - History of gestational hypertension/preeclampsia.  Patient does not want to take low-dose aspirin . Ultrasound The estimated fetal weight is at the 11th percentile and the abdominal circumference measurement at the 34th percentile.  Normal amniotic fluid.  Umbilical artery Doppler showed normal forward diastolic flow.  BPP 8/8. I reassured the patient of normal fetal growth assessment and antenatal testing.  Gestational diabetes I discussed the importance of good blood glucose control to prevent adverse outcomes.  I encouraged her to increase insulin  as needed to keep the fasting levels below 95 mg/dL and postprandial levels below 130 mg/dL.  I discussed hypoglycemia and corrective measures. I counseled the patient on timing of delivery.  Gestational diabetes, chronic hypertension and grade 3 obesity independently increase the risk of perinatal mortality.  I recommend delivery at [redacted] weeks gestation provided diabetes is well-controlled.  If diabetes is not  well-controlled, delivery should be considered at [redacted] weeks gestation. I counseled the patient on the importance of postpartum screening for type 2 diabetes.  If she has type 2 diabetes, preconception control is essential to prevent increased risk of fetal congenital malformations. Patient is planning to have another child.  Previous cesarean delivery Patient had successful VBAC's previous 2 pregnancies after induction of labor.  I counseled the patient that induction of labor slightly increases the risk of scar dehiscence to 2% to 3%.  Chronic hypertension Well-controlled without antihypertensives.  Jehovah's Witness Patient had questions about life in the blood.  I explained that RBCs have a limited span of 120 days.  If patient has severe postpartum hemorrhage, blood transfusions and preeclampsia.  Recommendations -NST at your office on 07/04/2024. - NST at our office in 2 weeks. -Fetal growth assessment, BPP and UA Doppler in 3 weeks.     Consultation including face-to-face (more than 50%) counseling 40 minutes.

## 2024-07-04 ENCOUNTER — Ambulatory Visit: Admitting: Student

## 2024-07-04 ENCOUNTER — Other Ambulatory Visit: Payer: Self-pay

## 2024-07-04 VITALS — BP 136/87 | HR 101 | Wt 217.5 lb

## 2024-07-04 DIAGNOSIS — O99013 Anemia complicating pregnancy, third trimester: Secondary | ICD-10-CM

## 2024-07-04 DIAGNOSIS — O10913 Unspecified pre-existing hypertension complicating pregnancy, third trimester: Secondary | ICD-10-CM

## 2024-07-04 DIAGNOSIS — O10912 Unspecified pre-existing hypertension complicating pregnancy, second trimester: Secondary | ICD-10-CM

## 2024-07-04 DIAGNOSIS — Z6841 Body Mass Index (BMI) 40.0 and over, adult: Secondary | ICD-10-CM

## 2024-07-04 DIAGNOSIS — O0993 Supervision of high risk pregnancy, unspecified, third trimester: Secondary | ICD-10-CM | POA: Diagnosis not present

## 2024-07-04 DIAGNOSIS — R7989 Other specified abnormal findings of blood chemistry: Secondary | ICD-10-CM

## 2024-07-04 DIAGNOSIS — Z98891 History of uterine scar from previous surgery: Secondary | ICD-10-CM

## 2024-07-04 DIAGNOSIS — O0990 Supervision of high risk pregnancy, unspecified, unspecified trimester: Secondary | ICD-10-CM

## 2024-07-04 DIAGNOSIS — Z3A34 34 weeks gestation of pregnancy: Secondary | ICD-10-CM

## 2024-07-04 DIAGNOSIS — O24414 Gestational diabetes mellitus in pregnancy, insulin controlled: Secondary | ICD-10-CM | POA: Diagnosis not present

## 2024-07-04 DIAGNOSIS — O99019 Anemia complicating pregnancy, unspecified trimester: Secondary | ICD-10-CM

## 2024-07-07 NOTE — Progress Notes (Signed)
 "  PRENATAL VISIT NOTE  Subjective:  Colleen Fields is a 33 y.o. [phone] at [redacted]w[redacted]d being seen today for ongoing prenatal care.  She is currently monitored for the following issues for this high-risk pregnancy and has History of cesarean delivery followed by successful vaginal birth (VBAC); History of VBAC; Obstructive sleep apnea on CPAP; BMI 40.0-44.9, adult (HCC); Blood product declined; History of severe pre-eclampsia; S/P laparoscopic sleeve gastrectomy; Supervision of high risk pregnancy, antepartum, second trimester; Chronic hypertension with exacerbation during pregnancy in second trimester; Obesity in pregnancy; History of gestational diabetes mellitus (GDM) in prior pregnancy, currently pregnant; Abnormal glucose affecting pregnancy; Low vitamin D  level; Blood transfusion declined due to reasons of conscience (JW); Anemia during pregnancy; and Gestational diabetes on their problem list.  Patient reports no complaints.  Contractions: Not present. Vag. Bleeding: None.  Movement: Present. Denies leaking of fluid.   The following portions of the patient's history were reviewed and updated as appropriate: allergies, current medications, past family history, past medical history, past social history, past surgical history and problem list.   Objective:   Vitals:   07/04/24 1609  BP: 136/87  Pulse: (!) 101  Weight: 217 lb 8 oz (98.7 kg)    Fetal Status:  Fetal Heart Rate (bpm): 159   Movement: Present    General: Alert, oriented and cooperative. Patient is in no acute distress.  Skin: Skin is warm and dry. No rash noted.   Cardiovascular: Normal heart rate noted  Respiratory: Normal respiratory effort, no problems with respiration noted  Abdomen: Soft, gravid, appropriate for gestational age.  Pain/Pressure: Present     Pelvic: Cervical exam deferred        Extremities: Normal range of motion.  Edema: Trace  Mental Status: Normal mood and affect. Normal behavior. Normal judgment  and thought content.      09/23/2020    9:13 AM 09/12/2020    2:13 PM 08/22/2020    8:19 AM  Depression screen PHQ 2/9  Decreased Interest 1 3 2   Down, Depressed, Hopeless 1 1 2   PHQ - 2 Score 2 4 4   Altered sleeping 1 0 2  Tired, decreased energy 1 3 2   Change in appetite 1 0 3  Feeling bad or failure about yourself  1 0 3  Trouble concentrating 1 1 1   Moving slowly or fidgety/restless 1 2 0  Suicidal thoughts 1 0 0  PHQ-9 Score 9  10  15       Data saved with a previous flowsheet row definition        09/23/2020    9:13 AM 09/12/2020    2:14 PM 08/22/2020    8:20 AM 08/14/2020    3:59 PM  GAD 7 : Generalized Anxiety Score  Nervous, Anxious, on Edge 1 2 3 3   Control/stop worrying 2 1 2 3   Worry too much - different things 2 2 3 3   Trouble relaxing 1 0 3 0  Restless 0 0 0 0  Easily annoyed or irritable 2 1 3 3   Afraid - awful might happen 0 0 1 0  Total GAD 7 Score 8 6 15 12     Assessment and Plan:  Pregnancy: G4P3003 at [redacted]w[redacted]d 1. Supervision of high risk pregnancy, antepartum (Primary) - Doing well overall - Fetal movement noted - FHR WDL  2. [redacted] weeks gestation of pregnancy - Swabs at next visit  3. Insulin  controlled gestational diabetes mellitus (GDM) in third trimester - Dexcom reports BS WDL 82%.  Normal range set at 70-140mg /dL  - Long-acting insulin  initiated at previous visit. Since then, patient has had a 2-3 low blood sugars overnight, but has resolved. Abnormal and elevated sugars in 150s-170s generally around 9 am and 3pm, but resolve within ~ 1hr from spike.  - Encourage continued long acting insulin  at night and diet modifications  4. History of VBAC 5. History of cesarean delivery followed by successful vaginal birth (VBAC) - Plan for VBAC - Consent has been completed  6. Chronic hypertension with exacerbation during pregnancy in second trimester - BP stable w/out meds  7. Anemia during pregnancy - IV iron  completed  8. BMI 40.0-44.9, adult (HCC) -  normal growth scans and TWG in pregnancy  9. Low vitamin D  level - Reordered supplementation  Preterm labor symptoms and general obstetric precautions including but not limited to vaginal bleeding, contractions, leaking of fluid and fetal movement were reviewed in detail with the patient. Please refer to After Visit Summary for other counseling recommendations.   Return in about 2 weeks (around 07/18/2024) for [facility], IN-PERSON.  Future Appointments  Date Time Provider Department Center  07/20/2024  3:15 PM [facility] PROVIDER 1 [facility]  07/20/2024  3:30 PM [facility] US4 [facility]  07/27/2024  9:55 AM Zina Jerilynn LABOR, MD [facility]  07/28/2024  9:45 AM [facility] NST [facility]    Nat Dauer, NP  "

## 2024-07-08 ENCOUNTER — Other Ambulatory Visit: Payer: Self-pay | Admitting: Student

## 2024-07-08 DIAGNOSIS — Z3A34 34 weeks gestation of pregnancy: Secondary | ICD-10-CM

## 2024-07-08 DIAGNOSIS — O24414 Gestational diabetes mellitus in pregnancy, insulin controlled: Secondary | ICD-10-CM

## 2024-07-08 DIAGNOSIS — O0990 Supervision of high risk pregnancy, unspecified, unspecified trimester: Secondary | ICD-10-CM

## 2024-07-08 MED ORDER — VITAMIN D (ERGOCALCIFEROL) 1.25 MG (50000 UNIT) PO CAPS: 50000.0000 [IU] | ORAL_CAPSULE | ORAL | 0 refills | Status: DC

## 2024-07-12 ENCOUNTER — Other Ambulatory Visit: Payer: Self-pay

## 2024-07-12 ENCOUNTER — Ambulatory Visit

## 2024-07-12 ENCOUNTER — Ambulatory Visit: Payer: Self-pay

## 2024-07-12 VITALS — BP 133/88 | HR 107 | Temp 98.0°F | Wt 218.4 lb

## 2024-07-12 DIAGNOSIS — Z3A35 35 weeks gestation of pregnancy: Secondary | ICD-10-CM

## 2024-07-12 DIAGNOSIS — O0993 Supervision of high risk pregnancy, unspecified, third trimester: Secondary | ICD-10-CM

## 2024-07-12 DIAGNOSIS — O0990 Supervision of high risk pregnancy, unspecified, unspecified trimester: Secondary | ICD-10-CM

## 2024-07-12 DIAGNOSIS — O169 Unspecified maternal hypertension, unspecified trimester: Secondary | ICD-10-CM

## 2024-07-12 DIAGNOSIS — O24414 Gestational diabetes mellitus in pregnancy, insulin controlled: Secondary | ICD-10-CM

## 2024-07-12 DIAGNOSIS — O163 Unspecified maternal hypertension, third trimester: Secondary | ICD-10-CM | POA: Diagnosis not present

## 2024-07-12 NOTE — Progress Notes (Signed)
 NST non-reactive - presumably due to fetal sleep cycle. Some accels present however not within 20 minutes. No decels. Pt to have BPP today

## 2024-07-13 NOTE — L&D Delivery Note (Addendum)
 Colleen Fields is a 34 y.o. female [phone] with IUP at [redacted]w[redacted]d admitted for IOL for A2GDM.  She progressed with augmentation to complete and pushed for with one contraction to deliver.  Cord clamping delayed by several minutes then clamped by CNM and cut by older sister.  Placenta intact and spontaneous, bleeding minimal.  Intact perineum.   Mom and baby stable prior to transfer to postpartum. She plans on breastfeeding. She requests interval copper  IUD for birth control.  Delivery Note At 10:37 PM a viable female was delivered via VBAC, Spontaneous (Presentation: Middle Occiput Anterior).  APGAR: 9, 9; weight pending.   Placenta status: Spontaneous, Intact.  Cord: 3 vessels with the following complications: None  Anesthesia: Epidural Episiotomy: None Lacerations: None Suture Repair: N/A Est. Blood Loss (mL):  20  Mom to postpartum.  Baby to Couplet care / Skin to Skin.  Colleen Fields 07/29/2024, 11:24 PM

## 2024-07-20 ENCOUNTER — Ambulatory Visit (HOSPITAL_BASED_OUTPATIENT_CLINIC_OR_DEPARTMENT_OTHER): Admitting: Obstetrics

## 2024-07-20 ENCOUNTER — Ambulatory Visit: Attending: Obstetrics and Gynecology

## 2024-07-20 VITALS — BP 122/70 | HR 98

## 2024-07-20 DIAGNOSIS — O10913 Unspecified pre-existing hypertension complicating pregnancy, third trimester: Secondary | ICD-10-CM | POA: Insufficient documentation

## 2024-07-20 DIAGNOSIS — O99213 Obesity complicating pregnancy, third trimester: Secondary | ICD-10-CM | POA: Insufficient documentation

## 2024-07-20 DIAGNOSIS — O133 Gestational [pregnancy-induced] hypertension without significant proteinuria, third trimester: Secondary | ICD-10-CM

## 2024-07-20 DIAGNOSIS — O99210 Obesity complicating pregnancy, unspecified trimester: Secondary | ICD-10-CM | POA: Insufficient documentation

## 2024-07-20 DIAGNOSIS — O10912 Unspecified pre-existing hypertension complicating pregnancy, second trimester: Secondary | ICD-10-CM | POA: Diagnosis present

## 2024-07-20 DIAGNOSIS — E669 Obesity, unspecified: Secondary | ICD-10-CM

## 2024-07-20 DIAGNOSIS — O0992 Supervision of high risk pregnancy, unspecified, second trimester: Secondary | ICD-10-CM

## 2024-07-20 DIAGNOSIS — Z8632 Personal history of gestational diabetes: Secondary | ICD-10-CM | POA: Diagnosis present

## 2024-07-20 DIAGNOSIS — O24414 Gestational diabetes mellitus in pregnancy, insulin controlled: Secondary | ICD-10-CM | POA: Diagnosis present

## 2024-07-20 DIAGNOSIS — Z3A36 36 weeks gestation of pregnancy: Secondary | ICD-10-CM | POA: Diagnosis present

## 2024-07-20 DIAGNOSIS — O34219 Maternal care for unspecified type scar from previous cesarean delivery: Secondary | ICD-10-CM

## 2024-07-20 DIAGNOSIS — O99843 Bariatric surgery status complicating pregnancy, third trimester: Secondary | ICD-10-CM | POA: Diagnosis not present

## 2024-07-20 DIAGNOSIS — O09299 Supervision of pregnancy with other poor reproductive or obstetric history, unspecified trimester: Secondary | ICD-10-CM | POA: Diagnosis present
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
## 2024-07-26 ENCOUNTER — Other Ambulatory Visit (HOSPITAL_COMMUNITY)
Admission: RE | Admit: 2024-07-26 | Discharge: 2024-07-26 | Disposition: A | Discharge status: Home / Self Care | Source: Ambulatory Visit | Attending: Obstetrics and Gynecology | Admitting: Obstetrics and Gynecology

## 2024-07-26 ENCOUNTER — Other Ambulatory Visit: Payer: Self-pay

## 2024-07-26 ENCOUNTER — Ambulatory Visit

## 2024-07-26 ENCOUNTER — Ambulatory Visit (INDEPENDENT_AMBULATORY_CARE_PROVIDER_SITE_OTHER): Admitting: Obstetrics and Gynecology

## 2024-07-26 ENCOUNTER — Encounter (HOSPITAL_COMMUNITY): Payer: Self-pay | Admitting: *Deleted

## 2024-07-26 ENCOUNTER — Telehealth (HOSPITAL_COMMUNITY): Payer: Self-pay | Admitting: *Deleted

## 2024-07-26 ENCOUNTER — Other Ambulatory Visit

## 2024-07-26 VITALS — BP 138/89 | HR 91 | Wt 217.3 lb

## 2024-07-26 DIAGNOSIS — O10913 Unspecified pre-existing hypertension complicating pregnancy, third trimester: Secondary | ICD-10-CM

## 2024-07-26 DIAGNOSIS — O24414 Gestational diabetes mellitus in pregnancy, insulin controlled: Secondary | ICD-10-CM

## 2024-07-26 DIAGNOSIS — Z3A37 37 weeks gestation of pregnancy: Secondary | ICD-10-CM

## 2024-07-26 DIAGNOSIS — Z531 Procedure and treatment not carried out because of patient's decision for reasons of belief and group pressure: Secondary | ICD-10-CM | POA: Diagnosis not present

## 2024-07-26 DIAGNOSIS — Z8759 Personal history of other complications of pregnancy, childbirth and the puerperium: Secondary | ICD-10-CM | POA: Diagnosis not present

## 2024-07-26 DIAGNOSIS — Z98891 History of uterine scar from previous surgery: Secondary | ICD-10-CM

## 2024-07-26 DIAGNOSIS — O99210 Obesity complicating pregnancy, unspecified trimester: Secondary | ICD-10-CM

## 2024-07-26 DIAGNOSIS — O99213 Obesity complicating pregnancy, third trimester: Secondary | ICD-10-CM

## 2024-07-26 DIAGNOSIS — O0993 Supervision of high risk pregnancy, unspecified, third trimester: Secondary | ICD-10-CM

## 2024-07-26 DIAGNOSIS — O99013 Anemia complicating pregnancy, third trimester: Secondary | ICD-10-CM | POA: Diagnosis not present

## 2024-07-26 DIAGNOSIS — O10919 Unspecified pre-existing hypertension complicating pregnancy, unspecified trimester: Secondary | ICD-10-CM

## 2024-07-26 DIAGNOSIS — O10912 Unspecified pre-existing hypertension complicating pregnancy, second trimester: Secondary | ICD-10-CM

## 2024-07-26 DIAGNOSIS — Z6841 Body Mass Index (BMI) 40.0 and over, adult: Secondary | ICD-10-CM | POA: Diagnosis not present

## 2024-07-26 DIAGNOSIS — O0990 Supervision of high risk pregnancy, unspecified, unspecified trimester: Secondary | ICD-10-CM

## 2024-07-26 DIAGNOSIS — O99019 Anemia complicating pregnancy, unspecified trimester: Secondary | ICD-10-CM

## 2024-07-26 DIAGNOSIS — Z9884 Bariatric surgery status: Secondary | ICD-10-CM

## 2024-07-26 DIAGNOSIS — O0992 Supervision of high risk pregnancy, unspecified, second trimester: Secondary | ICD-10-CM

## 2024-07-26 NOTE — Telephone Encounter (Signed)
 Preadmission screen

## 2024-07-26 NOTE — Progress Notes (Signed)
" ° °  PRENATAL VISIT NOTE  Subjective:  Colleen Fields is a 34 y.o. [phone] at [redacted]w[redacted]d being seen today for ongoing prenatal care.  She is currently monitored for the following issues for this high-risk pregnancy and has History of cesarean delivery followed by successful vaginal birth (VBAC); History of VBAC; Obstructive sleep apnea on CPAP; BMI 40.0-44.9, adult (HCC); Blood product declined; History of severe pre-eclampsia; S/P laparoscopic sleeve gastrectomy; Supervision of high risk pregnancy, antepartum, second trimester; Chronic hypertension with exacerbation during pregnancy in second trimester; Obesity in pregnancy; History of gestational diabetes mellitus (GDM) in prior pregnancy, currently pregnant; Abnormal glucose affecting pregnancy; Low vitamin D  level; Blood transfusion declined due to reasons of conscience (JW); Anemia during pregnancy; and Gestational diabetes on their problem list.  Patient doing well with no acute concerns today. She reports increased pelvic pressure.  Contractions: Irritability. Vag. Bleeding: None.  Movement: Present. Denies leaking of fluid.   The following portions of the patient's history were reviewed and updated as appropriate: allergies, current medications, past family history, past medical history, past social history, past surgical history and problem list. Problem list updated.  Objective:   Vitals:   07/26/24 1327  BP: 138/89  Pulse: 91  Weight: 217 lb 4.8 oz (98.6 kg)    Fetal Status: Fetal Heart Rate (bpm): 158 Fundal Height: 38 cm Movement: Present     General:  Alert, oriented and cooperative. Patient is in no acute distress.  Skin: Skin is warm and dry. No rash noted.   Cardiovascular: Normal heart rate noted  Respiratory: Normal respiratory effort, no problems with respiration noted  Abdomen: Soft, gravid, appropriate for gestational age.  Pain/Pressure: Present     Pelvic: Cervical exam deferred        Extremities: Normal range of  motion.  Edema: Trace  Mental Status:  Normal mood and affect. Normal behavior. Normal judgment and thought content.   Assessment and Plan:  Pregnancy: G4P3003 at [redacted]w[redacted]d  1. Supervision of high risk pregnancy, antepartum (Primary) Continue routine prenatal care  - GC/Chlamydia probe amp ([place])not at [facility] - Culture, beta strep (group b only)  2. Chronic hypertension with exacerbation during pregnancy in second trimester Pt is not on meds, BP borderline today, no other symptoms  3. Insulin  controlled gestational diabetes mellitus (GDM) in third trimester FBS: 70 avg PPBS: 128-137  Blood sugar drops at around 4 AM, drops 50 Pt advised to decrease lantus  to 8 units Will schedule IOL in next 3-5 days  4. Supervision of high risk pregnancy, antepartum, second trimester Continue routine prenatal care Pt desires postprtum tubal  5. S/P laparoscopic sleeve gastrectomy   6. Obesity in pregnancy   7. History of VBAC Hx of VBAC x 2, pt desires TOLAC this preg  8. History of severe pre-eclampsia No s/sx of preeclampsia  9. History of cesarean delivery followed by successful vaginal birth (VBAC)   10. BMI 40.0-44.9, adult (HCC)   11. Blood transfusion declined due to reasons of conscience (JW) Team to be aware due to [place] witness status  12. Anemia during pregnancy   Term labor symptoms and general obstetric precautions including but not limited to vaginal bleeding, contractions, leaking of fluid and fetal movement were reviewed in detail with the patient.  Please refer to After Visit Summary for other counseling recommendations.   No follow-ups on file.   Jerilynn Buddle, MD Faculty Attending Center for [facility]   "

## 2024-07-27 ENCOUNTER — Ambulatory Visit: Payer: Self-pay | Admitting: Obstetrics and Gynecology

## 2024-07-27 ENCOUNTER — Other Ambulatory Visit: Payer: Self-pay

## 2024-07-27 ENCOUNTER — Encounter: Payer: Self-pay | Admitting: Obstetrics and Gynecology

## 2024-07-27 LAB — GC/CHLAMYDIA PROBE AMP (~~LOC~~) NOT AT ARMC
Chlamydia: NEGATIVE
Comment: NEGATIVE
Comment: NORMAL
Neisseria Gonorrhea: NEGATIVE

## 2024-07-28 ENCOUNTER — Inpatient Hospital Stay (HOSPITAL_COMMUNITY)

## 2024-07-28 ENCOUNTER — Ambulatory Visit

## 2024-07-29 ENCOUNTER — Other Ambulatory Visit: Payer: Self-pay

## 2024-07-29 ENCOUNTER — Inpatient Hospital Stay (HOSPITAL_COMMUNITY)
Admission: AD | Admit: 2024-07-29 | Discharge: 2024-07-31 | DRG: 806 | Disposition: A | Attending: Obstetrics and Gynecology | Admitting: Obstetrics and Gynecology

## 2024-07-29 ENCOUNTER — Inpatient Hospital Stay (HOSPITAL_COMMUNITY): Admitting: Anesthesiology

## 2024-07-29 ENCOUNTER — Encounter (HOSPITAL_COMMUNITY): Payer: Self-pay | Admitting: Obstetrics and Gynecology

## 2024-07-29 DIAGNOSIS — O99824 Streptococcus B carrier state complicating childbirth: Secondary | ICD-10-CM | POA: Diagnosis present

## 2024-07-29 DIAGNOSIS — O99354 Diseases of the nervous system complicating childbirth: Secondary | ICD-10-CM | POA: Diagnosis present

## 2024-07-29 DIAGNOSIS — Z8249 Family history of ischemic heart disease and other diseases of the circulatory system: Secondary | ICD-10-CM | POA: Diagnosis not present

## 2024-07-29 DIAGNOSIS — Z833 Family history of diabetes mellitus: Secondary | ICD-10-CM | POA: Diagnosis not present

## 2024-07-29 DIAGNOSIS — O34219 Maternal care for unspecified type scar from previous cesarean delivery: Secondary | ICD-10-CM | POA: Diagnosis not present

## 2024-07-29 DIAGNOSIS — O1092 Unspecified pre-existing hypertension complicating childbirth: Secondary | ICD-10-CM | POA: Diagnosis present

## 2024-07-29 DIAGNOSIS — O24424 Gestational diabetes mellitus in childbirth, insulin controlled: Secondary | ICD-10-CM | POA: Diagnosis present

## 2024-07-29 DIAGNOSIS — F419 Anxiety disorder, unspecified: Secondary | ICD-10-CM | POA: Diagnosis present

## 2024-07-29 DIAGNOSIS — O99344 Other mental disorders complicating childbirth: Secondary | ICD-10-CM | POA: Diagnosis present

## 2024-07-29 DIAGNOSIS — O9081 Anemia of the puerperium: Secondary | ICD-10-CM | POA: Diagnosis not present

## 2024-07-29 DIAGNOSIS — G4730 Sleep apnea, unspecified: Secondary | ICD-10-CM | POA: Diagnosis present

## 2024-07-29 DIAGNOSIS — O10912 Unspecified pre-existing hypertension complicating pregnancy, second trimester: Principal | ICD-10-CM

## 2024-07-29 DIAGNOSIS — Z3A38 38 weeks gestation of pregnancy: Secondary | ICD-10-CM | POA: Diagnosis not present

## 2024-07-29 DIAGNOSIS — D62 Acute posthemorrhagic anemia: Secondary | ICD-10-CM | POA: Diagnosis not present

## 2024-07-29 LAB — PROTEIN / CREATININE RATIO, URINE
Creatinine, Urine: 128 mg/dL
Protein Creatinine Ratio: 0.1 mg/mg
Total Protein, Urine: 13 mg/dL

## 2024-07-29 LAB — SYPHILIS: RPR W/REFLEX TO RPR TITER AND TREPONEMAL ANTIBODIES, TRADITIONAL SCREENING AND DIAGNOSIS ALGORITHM: RPR Ser Ql: NONREACTIVE

## 2024-07-29 LAB — COMPREHENSIVE METABOLIC PANEL WITH GFR
ALT: 9 U/L (ref 0–44)
AST: 17 U/L (ref 15–41)
Albumin: 3.6 g/dL (ref 3.5–5.0)
Alkaline Phosphatase: 115 U/L (ref 38–126)
Anion gap: 12 (ref 5–15)
BUN: 12 mg/dL (ref 6–20)
CO2: 22 mmol/L (ref 22–32)
Calcium: 9 mg/dL (ref 8.9–10.3)
Chloride: 107 mmol/L (ref 98–111)
Creatinine, Ser: 0.68 mg/dL (ref 0.44–1.00)
GFR, Estimated: >60 mL/min
Glucose, Bld: 92 mg/dL (ref 70–99)
Potassium: 3.6 mmol/L (ref 3.5–5.1)
Sodium: 140 mmol/L (ref 135–145)
Total Bilirubin: 0.4 mg/dL (ref 0.0–1.2)
Total Protein: 6.5 g/dL (ref 6.5–8.1)

## 2024-07-29 LAB — CULTURE, BETA STREP (GROUP B ONLY): Strep Gp B Culture: POSITIVE — AB

## 2024-07-29 LAB — CBC
HCT: 27.4 % — ABNORMAL LOW (ref 36.0–46.0)
HCT: 32.1 % — ABNORMAL LOW (ref 36.0–46.0)
Hemoglobin: 11.5 g/dL — ABNORMAL LOW (ref 12.0–15.0)
Hemoglobin: 9.6 g/dL — ABNORMAL LOW (ref 12.0–15.0)
MCH: 31.1 pg (ref 26.0–34.0)
MCH: 31.8 pg (ref 26.0–34.0)
MCHC: 35 g/dL (ref 30.0–36.0)
MCHC: 35.8 g/dL (ref 30.0–36.0)
MCV: 88.7 fL (ref 80.0–100.0)
MCV: 88.7 fL (ref 80.0–100.0)
Platelets: 243 K/uL (ref 150–400)
Platelets: 282 K/uL (ref 150–400)
RBC: 3.09 MIL/uL — ABNORMAL LOW (ref 3.87–5.11)
RBC: 3.62 MIL/uL — ABNORMAL LOW (ref 3.87–5.11)
RDW: 13.4 % (ref 11.5–15.5)
RDW: 13.4 % (ref 11.5–15.5)
WBC: 6.2 K/uL (ref 4.0–10.5)
WBC: 6.5 K/uL (ref 4.0–10.5)
nRBC: 0.3 % — ABNORMAL HIGH (ref 0.0–0.2)
nRBC: 0.3 % — ABNORMAL HIGH (ref 0.0–0.2)

## 2024-07-29 LAB — GLUCOSE, CAPILLARY: Glucose-Capillary: 80 mg/dL (ref 70–99)

## 2024-07-29 LAB — NO BLOOD PRODUCTS

## 2024-07-29 LAB — TYPE AND SCREEN
ABO/RH(D): O POS
Antibody Screen: NEGATIVE

## 2024-07-29 MED ORDER — LORAZEPAM 2 MG/ML IJ SOLN
INTRAMUSCULAR | Status: AC
  Administered 2024-07-29: 2 mg via INTRAVENOUS
  Filled 2024-07-29: qty 1

## 2024-07-29 MED ORDER — PHENYLEPHRINE 80 MCG/ML (10ML) SYRINGE FOR IV PUSH (FOR BLOOD PRESSURE SUPPORT): 80.0000 ug | PREFILLED_SYRINGE | INTRAVENOUS | Status: DC | PRN

## 2024-07-29 MED ORDER — DIPHENHYDRAMINE HCL 50 MG/ML IJ SOLN: 12.5000 mg | INTRAMUSCULAR | Status: DC | PRN

## 2024-07-29 MED ORDER — LACTATED RINGERS IV SOLN: 500.0000 mL | INTRAVENOUS | Status: DC | PRN

## 2024-07-29 MED ORDER — HYDROXYZINE HCL 50 MG PO TABS
25.0000 mg | ORAL_TABLET | Freq: Once | ORAL | Status: AC | PRN
  Administered 2024-07-29: 25 mg via ORAL
  Filled 2024-07-29: qty 1

## 2024-07-29 MED ORDER — DIPHENHYDRAMINE HCL 25 MG PO CAPS
25.0000 mg | ORAL_CAPSULE | Freq: Once | ORAL | Status: AC
  Administered 2024-07-29: 25 mg via ORAL
  Filled 2024-07-29: qty 1

## 2024-07-29 MED ORDER — TERBUTALINE SULFATE 1 MG/ML IJ SOLN: 0.2500 mg | Freq: Once | INTRAMUSCULAR | Status: DC | PRN

## 2024-07-29 MED ORDER — EPHEDRINE 5 MG/ML INJ: 10.0000 mg | INTRAVENOUS | Status: DC | PRN

## 2024-07-29 MED ORDER — NIFEDIPINE ER OSMOTIC RELEASE 30 MG PO TB24: 30.0000 mg | ORAL_TABLET | Freq: Every day | ORAL | Status: DC

## 2024-07-29 MED ORDER — DIPHENHYDRAMINE HCL 50 MG/ML IJ SOLN
12.5000 mg | INTRAMUSCULAR | Status: DC | PRN
  Administered 2024-07-29: 12.5 mg via INTRAVENOUS
  Filled 2024-07-29: qty 1

## 2024-07-29 MED ORDER — SODIUM CHLORIDE 0.9 % IV SOLN
5.0000 10*6.[IU] | Freq: Once | INTRAVENOUS | Status: AC
  Administered 2024-07-29: 5 10*6.[IU] via INTRAVENOUS
  Filled 2024-07-29: qty 5

## 2024-07-29 MED ORDER — FENTANYL-BUPIVACAINE-NACL 0.5-0.125-0.9 MG/250ML-% EP SOLN
12.0000 mL/h | EPIDURAL | Status: DC | PRN
  Administered 2024-07-29: 12 mL/h via EPIDURAL
  Filled 2024-07-29: qty 250

## 2024-07-29 MED ORDER — LIDOCAINE HCL (PF) 1 % IJ SOLN: 30.0000 mL | INTRAMUSCULAR | Status: DC | PRN

## 2024-07-29 MED ORDER — OXYTOCIN-SODIUM CHLORIDE 30-0.9 UT/500ML-% IV SOLN
2.5000 [IU]/h | INTRAVENOUS | Status: DC
  Administered 2024-07-29: 2.5 [IU]/h via INTRAVENOUS
  Filled 2024-07-29: qty 500

## 2024-07-29 MED ORDER — LACTATED RINGERS IV SOLN: INTRAVENOUS | Status: DC

## 2024-07-29 MED ORDER — OXYCODONE-ACETAMINOPHEN 5-325 MG PO TABS: 1.0000 | ORAL_TABLET | ORAL | Status: DC | PRN

## 2024-07-29 MED ORDER — LIDOCAINE HCL (PF) 1 % IJ SOLN
INTRAMUSCULAR | Status: DC | PRN
  Administered 2024-07-29: 8 mL via EPIDURAL

## 2024-07-29 MED ORDER — SOD CITRATE-CITRIC ACID 500-334 MG/5ML PO SOLN: 30.0000 mL | ORAL | Status: DC | PRN

## 2024-07-29 MED ORDER — ACETAMINOPHEN 325 MG PO TABS
650.0000 mg | ORAL_TABLET | ORAL | Status: DC | PRN
  Administered 2024-07-30: 650 mg via ORAL
  Filled 2024-07-29: qty 2

## 2024-07-29 MED ORDER — OXYTOCIN-SODIUM CHLORIDE 30-0.9 UT/500ML-% IV SOLN
1.0000 m[IU]/min | INTRAVENOUS | Status: DC
  Administered 2024-07-29: 2 m[IU]/min via INTRAVENOUS
  Filled 2024-07-29: qty 500

## 2024-07-29 MED ORDER — NIFEDIPINE ER OSMOTIC RELEASE 30 MG PO TB24
30.0000 mg | ORAL_TABLET | Freq: Every day | ORAL | Status: DC
  Administered 2024-07-29: 30 mg via ORAL
  Filled 2024-07-29: qty 1

## 2024-07-29 MED ORDER — LACTATED RINGERS IV SOLN: 500.0000 mL | Freq: Once | INTRAVENOUS | Status: DC

## 2024-07-29 MED ORDER — PENICILLIN G POT IN DEXTROSE 60000 UNIT/ML IV SOLN
3.0000 10*6.[IU] | INTRAVENOUS | Status: DC
  Administered 2024-07-29 (×5): 3 10*6.[IU] via INTRAVENOUS
  Filled 2024-07-29 (×5): qty 50

## 2024-07-29 MED ORDER — LACTATED RINGERS IV SOLN
500.0000 mL | Freq: Once | INTRAVENOUS | Status: AC
  Administered 2024-07-29: 500 mL via INTRAVENOUS

## 2024-07-29 MED ORDER — OXYTOCIN BOLUS FROM INFUSION
333.0000 mL | Freq: Once | INTRAVENOUS | Status: AC
  Administered 2024-07-29: 333 mL via INTRAVENOUS

## 2024-07-29 MED ORDER — ONDANSETRON HCL 4 MG/2ML IJ SOLN: 4.0000 mg | Freq: Four times a day (QID) | INTRAMUSCULAR | Status: DC | PRN

## 2024-07-29 MED ORDER — LORAZEPAM 2 MG/ML IJ SOLN: 2.0000 mg | Freq: Once | INTRAMUSCULAR | Status: AC

## 2024-07-29 MED ORDER — FENTANYL CITRATE (PF) 100 MCG/2ML IJ SOLN
50.0000 ug | INTRAMUSCULAR | Status: DC | PRN
  Administered 2024-07-29: 100 ug via INTRAVENOUS
  Filled 2024-07-29 (×2): qty 2

## 2024-07-29 MED ORDER — FENTANYL-BUPIVACAINE-NACL 0.5-0.125-0.9 MG/250ML-% EP SOLN: 12.0000 mL/h | EPIDURAL | Status: DC | PRN

## 2024-07-29 NOTE — Progress Notes (Signed)
 Colleen Fields is a 34 y.o. [phone] at [redacted]w[redacted]d by ultrasound admitted for induction of labor due to Gestational diabetes.  Subjective:   Objective: BP 137/76   Pulse 91   Temp 97.9 F (36.6 C) (Oral)   Resp 18   Ht 4' 11 (1.499 m)   Wt 99.8 kg   LMP 10/28/2023   BMI 44.43 kg/m  I/O last 3 completed shifts: In: -  Out: 450 [Urine:450] No intake/output data recorded.  FHT:  FHR: 150 bpm, variability: minimal with periods of moderate,  accelerations:  Abscent,  decelerations:  Absent UC:   Difficult to trace with TOCO SVE:   Dilation: 5 Effacement (%): 90 Station: -2 Exam by:: [employer], CNM  Labs: Lab Results  Component Value Date   WBC 6.5 07/29/2024   HGB 9.6 (L) 07/29/2024   HCT 27.4 (L) 07/29/2024   MCV 88.7 07/29/2024   PLT 243 07/29/2024    Assessment / Plan: Induction of labor due to gestational diabetes,  progressing well on pitocin   Labor: Progressing normally, discussed AROM and IUPC placement, pt defers due to prior birth experience that lead to C/S, given cervical change, we will reevaluate in 4 hours. Preeclampsia:  labs stable Fetal Wellbeing:  Category II Pain Control:  Epidural I/D:  GBS Positive - PCN Anticipated MOD:  NSVD  Tam Savoia R Ines Rebel, CNM 07/29/2024, 10:11 PM

## 2024-07-29 NOTE — Progress Notes (Signed)
" °   07/29/24 0120  Fetal Heart Rate A  Mode External  Baseline Rate (A) 160 bpm  Variability 6-25 BPM  Accelerations 15 x 15  Decelerations None  Uterine Activity  Mode Toco  Contraction Frequency (min) occ uc with ui  Cervical Exam  Dilation 1.5  Effacement (%) 70  Station -3  Presentation Vertex  Exam by: Dr. Magali   MD attempted to place FB, pt unable to tolerate. Will try later.  "

## 2024-07-29 NOTE — Progress Notes (Signed)
 0434 - CBG 80, pts dexcom reads 84

## 2024-07-29 NOTE — H&P (Addendum)
 OBSTETRIC ADMISSION HISTORY AND PHYSICAL  Colleen Fields is a 34 y.o. 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 She reports normal FM (slightly decreased for a few weeks), No LOF, no VB, no blurry vision, headaches or peripheral edema, and RUQ pain.  Some SOB during 3rd trimester and a cough. She plans on breast feeding. She request copper  IUD for birth control. She received her prenatal care at [facility]   Dating: By US  --->  Estimated Date of Delivery: 08/11/24  Sono:    @[redacted]w[redacted]d , CWD, normal anatomy, cephalic presentation, anterior placenta, 3140g, 65% EFW   Prenatal History/Complications:         NURSING  PROVIDER  Conservator, [employer] [facility] [employer] Dating by U/S at 12.0 wks  [facility] Model Traditional Anatomy U/S wnl  Initiated care at  [employer]  English               LAB RESULTS   Support Person Chartered Certified Accountant (husband) Genetics NIPS: declined AFP: neg      NT/IT (FT only)        Carrier Screen Horizon: neg  Rhogam  O/Positive/-- (08/11 1325) A1C/GTT Early HgbA1C: 5.7-->90/169/76 Third trimester 2 hr GTT: 94/210/121  Flu Vaccine Declined 05/30/24      TDaP Vaccine Declined 05/30/24 Blood Type O/Positive/-- (08/11 1325)O  RSV Vaccine   Antibody Negative (08/11 1325)  COVID Vaccine   Rubella 2.05 (08/11 1325)  Feeding Plan breast RPR Non Reactive (08/11 1325)  Contraception Undecided HBsAg Negative (08/11 1325)  Circumcision N/a girl HIV Non Reactive (08/11 1325)  Pediatrician  [facility] HCVAb Non Reactive (08/11 1325)  Prenatal Classes        BTL Consent   Pap       Diagnosis  Date Value Ref Range Status  03/21/2020     Final    - Negative for intraepithelial lesion or malignancy (NILM)    BTL Pre-payment   GC/CT Initial:   36wks:    VBAC Consent 05/30/24 GBS For PCN allergy, check sensitivities   BRx Optimized? [ ]  yes   [ ]  no      DME Rx [x]  BP cuff [N/a] Weight Scale Waterbirth  [ ]  Class [ ]  Consent [ ]  CNM  visit  PHQ9 & GAD7 [  ] new OB [  ] 28 weeks  [  ] 36 weeks Induction  [ ]  Orders Entered [ ] Foley Y/N     Past Medical History: Past Medical History:  Diagnosis Date   Anxiety    Anxiety and depression 12/09/2017   Chest pain    Complication of anesthesia    1st eipidural -didn't get relief from spinal- had severe pain during procedure, couldn't see   Depression    Diabetes mellitus without complication (HCC)    gestational- diet   Dysrhythmia    palpitations   Gestational diabetes    History of gestational diabetes mellitus 09/05/2017   [ ]  qyear a1c  09/2020: passed 2h post partum GTT   History of migraine 09/12/2020   History of severe pre-eclampsia 09/02/2020   Postpartum readmission for MGSO4   Migraine    Obesity 07/22/2017   Pre-diabetes    Tachycardia     Past Surgical History: Past Surgical History:  Procedure Laterality Date   CESAREAN SECTION     LAPAROSCOPIC GASTRIC SLEEVE RESECTION N/A  08/04/2022   Procedure: LAPAROSCOPIC SLEEVE GASTRECTOMY;  Surgeon: Tanda Locus, MD;  Location: [facility];  Service: General;  Laterality: N/A;   UPPER GI ENDOSCOPY N/A 08/04/2022   Procedure: UPPER GI ENDOSCOPY;  Surgeon: Tanda Locus, MD;  Location: [facility];  Service: General;  Laterality: N/A;   WISDOM TOOTH EXTRACTION      Obstetrical History:   OB History  Gravida Para Term Preterm AB Living  4 3 3  0  3  SAB IAB Ectopic Multiple Live Births     0 3    # Outcome Date GA Lbr Len/2nd Weight Sex Type Anes PTL Lv  4 Current           3 Term 08/24/20 [redacted]w[redacted]d 15:02 / 00:03 2946 g M Vag-Spont EPI  LIV     Birth Comments: GDM, IOL due to GDM  2 Term 01/23/18 [redacted]w[redacted]d / 00:17 3206 g M VBAC EPI  LIV     Birth Comments: GDM  1 Term 12/03/09 [redacted]w[redacted]d  3062 g F CS-LTranv Spinal  LIV     Birth Comments: wnl; IOL due decel in FHR at MD office, then c/s due to [facility]     Complications: Failure to Progress in First Stage    Social History Social History   Socioeconomic History    Marital status: Married    Spouse name: Not on file   Number of children: Not on file   Years of education: Not on file   Highest education level: Not on file  Occupational History   Not on file  Tobacco Use   Smoking status: Never   Smokeless tobacco: Never  Vaping Use   Vaping status: Never Used  Substance and Sexual Activity   Alcohol use: Not Currently    Comment: occasionally   Drug use: Not Currently    Types: Marijuana    Comment: hasn't smoked in forever  only at age 33   Sexual activity: Yes    Birth control/protection: None  Other Topics Concern   Not on file  Social History Narrative   Not on file   Social Drivers of Health   Tobacco Use: Low Risk (07/26/2024)   Patient History    Smoking Tobacco Use: Never    Smokeless Tobacco Use: Never    Passive Exposure: Not on file  Financial Resource Strain: Not on File (01/09/2022)   Received from [employer]    Financial Resource Strain: 0  Food Insecurity: No Food Insecurity (07/29/2024)   Epic    Worried About Programme Researcher, Broadcasting/film/video in the Last Year: Never true    Ran Out of Food in the Last Year: Never true  Transportation Needs: No Transportation Needs (07/29/2024)   Epic    Lack of Transportation (Medical): No    Lack of Transportation (Non-Medical): No  Physical Activity: Not on File (01/09/2022)   Received from [facility]   Physical Activity    Physical Activity: 0  Stress: Not on File (01/09/2022)   Received from [place]   Stress    Stress: 0  Social Connections: Not on File (03/27/2023)   Received from [facility]   Social Connections    Connectedness: 0  Depression (PHQ2-9): Not on file  Alcohol Screen: Not on file  Housing: Low Risk (07/29/2024)   Epic    Unable to Pay for Housing in the Last Year: No    Number of Times Moved in the Last Year: 0    Homeless in the  Last Year: No  Utilities: Not At Risk (07/29/2024)   Epic    Threatened with loss of utilities: No  Health Literacy: Not on  file    Family History: Family History  Problem Relation Age of Onset   Diabetes Mother    Hypertension Mother    Bipolar disorder Father    Schizophrenia Father    Hypertension Sister    Fibroids Sister    Breast cancer Maternal Aunt    Sleep apnea Maternal Uncle     Allergies: Allergies[1]  Medications Prior to Admission  Medication Sig Dispense Refill Last Dose/Taking   Accu-Chek Softclix Lancets lancets Use as instructed 100 each 12    aspirin  81 MG chewable tablet Chew 1 tablet (81 mg total) by mouth daily. (Patient not taking: Reported on 07/04/2024) 90 tablet 2    Blood Glucose Monitoring Suppl (ACCU-CHEK GUIDE) w/Device KIT 1 Device by Does not apply route as needed. O24.419  Check blood sugar qid 1 kit 0    Blood Pressure Monitoring (BLOOD PRESSURE KIT) DEVI 1 Device by Does not apply route as needed. 1 each 0    Continuous Glucose Receiver (DEXCOM G7 RECEIVER) DEVI 1 Units by Does not apply route as directed. 1 each 11    Continuous Glucose Sensor (DEXCOM G7 SENSOR) MISC 1 Units by Does not apply route as directed. 1 each 11    diphenhydrAMINE  (BENADRYL ) 50 MG capsule Take 50 mg by mouth at bedtime as needed.      ferrous sulfate  324 MG TBEC Take 1 tablet (324 mg total) by mouth every other day. 90 tablet 2    glucose blood (ACCU-CHEK GUIDE TEST) test strip Use as instructed 100 each 12    insulin  glargine (LANTUS ) 100 UNIT/ML Solostar Pen Inject 10 Units into the skin at bedtime. 15 mL 0    Insulin  Pen Needle 31G X 5 MM MISC Inject 1 each into the skin daily at 6 (six) AM. 100 each 1    magnesium  citrate SOLN Take 1 Bottle by mouth once.      Prenatal Vit-Fe Fumarate-FA (PREPLUS) 27-1 MG TABS Take 1 tablet by mouth daily.      Vitamin D , Ergocalciferol , (DRISDOL ) 1.25 MG (50000 UNIT) CAPS capsule Take 1 capsule (50,000 Units total) by mouth every 7 (seven) days for 6 doses. 6 capsule 0      Review of Systems   All systems reviewed and negative except as stated in  HPI  Blood pressure (!) 142/97, pulse 99, temperature 98 F (36.7 C), temperature source Oral, resp. rate 19, height 4' 11 (1.499 m), weight 99.8 kg, last menstrual period 10/28/2023. General appearance: alert, cooperative, and no distress Lungs: nml respiratory effort Heart: regular rate  Abdomen: soft, non-tender; bowel sounds normal Extremities: Homans sign is negative, no sign of DVT Presentation: cephalic Fetal monitoringBaseline: 170 bpm, Variability: Good {> 6 bpm), and Accelerations: Reactive Uterine activity irregular Dilation: 1.5 Effacement (%): 70 Station: -3 Exam by:: Dr. Magali   Prenatal labs: ABO, Rh: --/--/PENDING (01/17 0043) Antibody: PENDING (01/17 0043) Rubella: 2.05 (08/11 1325) RPR: Non Reactive (11/18 0952)  HBsAg: Negative (08/11 1325)  HIV: Non Reactive (11/18 0952)  GBS: Positive/-- (01/14 1707)    Lab Results  Component Value Date   GBS Positive (A) 07/26/2024   GTT GDMA2 on insulin  (Early HgbA1C: 5.7-->90/169/76 Third trimester 2 hr GTT: 94/210/121) Genetic screening  NIPS: declined AFP: neg Anatomy US  [redacted]w[redacted]d, technically difficult due to maternal habitus and fetal position.  There is no immunization history for the selected administration types on file for this patient.  Prenatal Transfer Tool  Maternal Diabetes: Yes:  Diabetes Type:  Insulin /Medication controlled Genetic Screening: Declined, normal AFP Maternal Ultrasounds/Referrals: Normal Fetal Ultrasounds or other Referrals:  None Maternal Substance Abuse:  No Significant Maternal Medications:  None Significant Maternal Lab Results: Group B Strep positive Number of Prenatal Visits:greater than 3 verified prenatal visits Maternal Vaccinations:Declined Other Comments:  None   Results for orders placed or performed during the hospital encounter of 07/29/24 (from the past 24 hours)  No blood products   Collection Time: 07/29/24 12:40 AM  Result Value Ref Range   Transfuse no blood  products      TRANSFUSE NO BLOOD PRODUCTS, VERIFIED BY ALDEAN CHARNLEY MENSAH RN Performed at [facility], [address], [place], [place] [address]   CBC   Collection Time: 07/29/24 12:43 AM  Result Value Ref Range   WBC 6.2 4.0 - 10.5 K/uL   RBC 3.62 (L) 3.87 - 5.11 MIL/uL   Hemoglobin 11.5 (L) 12.0 - 15.0 g/dL   HCT 67.8 (L) 63.9 - 53.9 %   MCV 88.7 80.0 - 100.0 fL   MCH 31.8 26.0 - 34.0 pg   MCHC 35.8 30.0 - 36.0 g/dL   RDW 86.5 88.4 - 84.4 %   Platelets 282 150 - 400 K/uL   nRBC 0.3 (H) 0.0 - 0.2 %  Comprehensive metabolic panel   Collection Time: 07/29/24 12:43 AM  Result Value Ref Range   Sodium 140 135 - 145 mmol/L   Potassium 3.6 3.5 - 5.1 mmol/L   Chloride 107 98 - 111 mmol/L   CO2 22 22 - 32 mmol/L   Glucose, Bld 92 70 - 99 mg/dL   BUN 12 6 - 20 mg/dL   Creatinine, Ser 9.31 0.44 - 1.00 mg/dL   Calcium  9.0 8.9 - 10.3 mg/dL   Total Protein 6.5 6.5 - 8.1 g/dL   Albumin 3.6 3.5 - 5.0 g/dL   AST 17 15 - 41 U/L   ALT 9 0 - 44 U/L   Alkaline Phosphatase 115 38 - 126 U/L   Total Bilirubin 0.4 0.0 - 1.2 mg/dL   GFR, Estimated >39 >39 mL/min   Anion gap 12 5 - 15  Type and screen   Collection Time: 07/29/24 12:43 AM  Result Value Ref Range   ABO/RH(D) PENDING    Antibody Screen PENDING    Sample Expiration      08/01/2024,2359 Performed at [facility], [address] [place], [place] [address]     Patient Active Problem List   Diagnosis Date Noted   Indication for care or intervention in labor or delivery 07/29/2024   Gestational diabetes 05/31/2024   Anemia during pregnancy 04/11/2024   Blood transfusion declined due to reasons of conscience (JW) 03/27/2024   Abnormal glucose affecting pregnancy 02/28/2024   Low vitamin D  level 02/28/2024   Supervision of high risk pregnancy, antepartum, second trimester 02/09/2024   Chronic hypertension with exacerbation during pregnancy in second trimester 02/09/2024   Obesity in pregnancy 02/09/2024   History  of gestational diabetes mellitus (GDM) in prior pregnancy, currently pregnant 02/09/2024   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 GBS unknown, [phone] with hx of 2 successful VBACs, hx of chronic HTN and post-partum pre-eclampsia, iron -deficiency anemia, GDMA2 (on insulin ), and sleep apnea at [redacted]w[redacted]d here for IOL.   #Labor:IOL for GDMA2 Hx of VBAC x 2, pt desires TOLAC this pregnancy.  - Attempt to place balloon, patient unable to tolerate. Will attempt with IV pain medications at next check.  - Uptitrate pitocin  per protocol  #Pain: Planning on epidural.  #FWB: Category II tracing due to slightly elevated baseline - IV fluid bolus and continuous monitoring #GBS status:  Positive  #Feeding: Breastmilk  #Reproductive Life planning: IUD Paragard  at post partum visit #Circ:  not applicable  #GDMA2 Controlled on insulin , wore Dexcom.  - Q4H blood glucose checks  #Jehovah's witness #Declines blood products - Starting Hgb 11.5 - No blood products  - TXA at delivery  #Sleep apnea Prescribed home CPAP but does not use.   #Chronic hypertension #Hx of PP pre-eclampsia Developed severe pre-eclampsia post-partum with last pregnancy. No signs or sx of pre-eclampsia this pregnancy. Has had elevated Bps in office. Patient not on chronic medications.  - Watch Bps - Consider lasix /potassium/procardia  on discharge to prevent pp pre-eclampsia  #Iron  deficiency anemia  Received IV iron  this pregnancy. Taking oral iron  every other day - Continue oral iron   #hx of laparoscopic sleeve in 2024 Patient had laparoscopic sleeve gastrectomy, does not want  husband to know.  - Monitor nutrient status post partum   Garen SHAUNNA Puffer, MD, PGY-1 07/29/2024, 1:31 AM   Attestation of Supervision of Student:  I confirm that I have verified the information documented in the resident's note and that I have also personally reperformed the history, physical exam and all medical decision making activities.  I have verified that all services and findings are accurately documented in this student's note; and I agree with management and plan as outlined in the documentation. I have also made any necessary editorial changes.   Barkley LITTIE Angles, MD OB Fellow 07/29/2024 2:16 AM     [1]  Allergies Allergen Reactions   Latex Itching and Other (See Comments)    Only occurs when exposed over a long duration, no respiratory issues

## 2024-07-29 NOTE — Discharge Summary (Shared)
 "    Postpartum Discharge Summary  Date of Service updated***     Patient Name: Colleen Fields DOB: 1990/09/01 MRN: [id number]  Date of admission: 07/29/2024 Delivery date:07/29/2024 Delivering provider: JUNETTE MAUS R Date of discharge: 07/29/2024  Admitting diagnosis: Indication for care or intervention in labor or delivery [O75.9] Intrauterine pregnancy: [redacted]w[redacted]d     Secondary diagnosis:  Principal Problem:   Indication for care or intervention in labor or delivery Active Problems:   VBAC, delivered  Additional problems: Elevated BP meds start on this admission    Discharge diagnosis: VBAC, CHTN, and GDM A2                                              Post partum procedures:{Postpartum procedures:23558} Augmentation: Pitocin  Complications: None  Hospital course: Induction of Labor With Vaginal Delivery   34 y.o. yo [phone] at [redacted]w[redacted]d was admitted to the hospital 07/29/2024 for induction of labor.  Indication for induction: A2 DM.  Patient had an labor course uncomplicated.  Membrane Rupture Time/Date: 10:35 PM,07/29/2024  Delivery Method:VBAC, Spontaneous Operative Delivery:N/A Episiotomy: None Lacerations:  None Details of delivery can be found in separate delivery note.  Patient had a postpartum course complicated by***. Patient is discharged home 07/29/24.  Newborn Data: Birth date:07/29/2024 Birth time:10:37 PM Gender:Female Living status:Living Apgars:9 ,9  Weight:   Magnesium  Sulfate received: No BMZ received: No Rhophylac:No MMR:No T-DaP:Declined Flu: No RSV Vaccine received: No Transfusion:No  Immunizations received: There is no immunization history for the selected administration types on file for this patient.  Physical exam  Vitals:   07/29/24 2030 07/29/24 2100 07/29/24 2130 07/29/24 2200  BP: (!) 152/92 123/68 137/76 (!) 154/91  Pulse: 93 91 91 (!) 107  Resp:      Temp:      TempSrc:      Weight:      Height:       General: {Exam;  general:21111117} Lochia: {Desc; appropriate/inappropriate:30686::appropriate} Uterine Fundus: {Desc; firm/soft:30687} Incision: {Exam; incision:21111123} DVT Evaluation: {Exam; dvt:2111122} Labs: Lab Results  Component Value Date   WBC 6.5 07/29/2024   HGB 9.6 (L) 07/29/2024   HCT 27.4 (L) 07/29/2024   MCV 88.7 07/29/2024   PLT 243 07/29/2024      Latest Ref Rng & Units 07/29/2024   12:43 AM  CMP  Glucose 70 - 99 mg/dL 92   BUN 6 - 20 mg/dL 12   Creatinine 9.55 - 1.00 mg/dL 9.31   Sodium 864 - 854 mmol/L 140   Potassium 3.5 - 5.1 mmol/L 3.6   Chloride 98 - 111 mmol/L 107   CO2 22 - 32 mmol/L 22   Calcium  8.9 - 10.3 mg/dL 9.0   Total Protein 6.5 - 8.1 g/dL 6.5   Total Bilirubin 0.0 - 1.2 mg/dL 0.4   Alkaline Phos 38 - 126 U/L 115   AST 15 - 41 U/L 17   ALT 0 - 44 U/L 9    Edinburgh Score:    09/23/2020    9:06 AM  Edinburgh Postnatal Depression Scale Screening Tool  I have been able to laugh and see the funny side of things. 0   I have looked forward with enjoyment to things. 0   I have blamed myself unnecessarily when things went wrong. 0   I have been anxious or worried for no good reason. 0  I have felt scared or panicky for no good reason. 0   Things have been getting on top of me. 0   I have been so unhappy that I have had difficulty sleeping. 0   I have felt sad or miserable. 0   I have been so unhappy that I have been crying. 0   The thought of harming myself has occurred to me. 0   Edinburgh Postnatal Depression Scale Total 0      Data saved with a previous flowsheet row definition   No data recorded  After visit meds:  Allergies as of 07/29/2024       Reactions   Latex Itching, Other (See Comments)   Only occurs when exposed over a long duration, no respiratory issues     Med Rec must be completed prior to using this [facility]***        Discharge home in stable condition Infant Feeding: {Baby feeding:23562} Infant Disposition:{CHL IP OB  HOME WITH FNUYZM:76418} Discharge instruction: per After Visit Summary and Postpartum booklet. Activity: Advance as tolerated. Pelvic rest for 6 weeks.  Diet: {OB ipzu:[id number]} Future Appointments:No future appointments. Follow up Visit:  Message sent to [facility] Please schedule this patient for a In person postpartum visit in 6 weeks with the following provider: Any provider. Additional Postpartum F/U:2 hour GTT and BP check 1 week  High risk pregnancy complicated by: GDM Delivery mode:  VBAC, Spontaneous Anticipated Birth Control:  IUD   07/29/2024 Tawni SAUNDERS Oona Trammel, CNM    "

## 2024-07-29 NOTE — Progress Notes (Addendum)
 0300 - Went in rm to place pt back on monitor, pt in BR.  0305 -  Pt back from BR, reeducated pt not to take monitors off to go to BR. Pt asked this RN to wait for a while and not place monitors back on.  Pt states she uncomfortable and irritated. When asked if anything can be done to help, pt declines. Pt educated about importance of fetal monitoring for a TOLAC on pit. Pt insisted on waiting to be place on monitor. Pit DC. Will restart when pt allows monitoring.

## 2024-07-29 NOTE — Progress Notes (Signed)
 Colleen Fields is a 34 y.o. [phone]  admitted for induction of labor due to A2GDM. She is a VBAC, but has had two successful vaginal deliveries.   Subjective: Having infrequent contractions, states that they are not painful.  SNM to bedside to discuss POC with patient. She is pleasant but also tearful and anxious about this IOL. SNM provided reassurance and answered all questions and concerns. She is open to SVE's and FB placement, but does request pain medication prior to each.   Objective: BP (!) 143/84   Pulse 82   Temp 97.8 F (36.6 C) (Oral)   Resp 17   Ht 4' 11 (1.499 m)   Wt 99.8 kg   LMP 10/28/2023   BMI 44.43 kg/m  No intake/output data recorded. No intake/output data recorded.  FHT:  FHR: 140 bpm, variability: moderate,  accelerations:  Abscent,  decelerations:  Absent UC:   irregular, every 2-10 minutes SVE:   Dilation: 1.5 Effacement (%): 70 Station: -3 Exam by:: Dr. Magali  Labs: Lab Results  Component Value Date   WBC 6.5 07/29/2024   HGB 9.6 (L) 07/29/2024   HCT 27.4 (L) 07/29/2024   MCV 88.7 07/29/2024   PLT 243 07/29/2024    Assessment / Plan: Induction of labor due to A2GDM  Labor: Currently on 9mu/Pitocin . Last SVE 0120. Patient is open to active management but requests pain medication prior to any SVE. Fentanyl  available, in addition to Vistaril  25mg  order PO for anxiety. Plan to perform SVE once patient is comfortable and possible place FB. Fetal Wellbeing:  Category I Pain Control:  Epidural I/D:  GBS Positive Anticipated MOD:  NSVD  Seamus Warehime L Terrian Ridlon, Student-MidWife 07/29/2024, 10:09 AM

## 2024-07-29 NOTE — Anesthesia Procedure Notes (Signed)
 Epidural Patient location during procedure: OB Start time: 07/29/2024 1:28 PM End time: 07/29/2024 1:32 PM  Staffing Anesthesiologist: Mallory Manus, MD Performed: other anesthesia staff   Preanesthetic Checklist Completed: patient identified, IV checked, site marked, risks and benefits discussed, surgical consent, monitors and equipment checked, pre-op evaluation and timeout performed  Epidural Patient position: sitting Prep: DuraPrep and site prepped and draped Patient monitoring: continuous pulse ox and blood pressure Approach: midline Location: L4-L5 Injection technique: LOR air  Needle:  Needle type: Tuohy  Needle gauge: 17 G Needle length: 9 cm and 9 Needle insertion depth: 9 cm Catheter type: closed end flexible Catheter size: 19 Gauge Catheter at skin depth: 15 cm Test dose: negative  Assessment Events: blood not aspirated, no cerebrospinal fluid, injection not painful, no injection resistance, no paresthesia and negative IV test

## 2024-07-29 NOTE — Anesthesia Preprocedure Evaluation (Addendum)
"                                    Anesthesia Evaluation  Patient identified by MRN, date of birth, ID band Patient awake    Reviewed: Allergy & Precautions, H&P , NPO status , Patient's Chart, lab work & pertinent test results  History of Anesthesia Complications (+) history of anesthetic complications  Airway Mallampati: II  TM Distance: >3 FB Neck ROM: Full    Dental no notable dental hx. (+) Dental Advisory Given, Teeth Intact   Pulmonary neg pulmonary ROS, sleep apnea    Pulmonary exam normal breath sounds clear to auscultation       Cardiovascular hypertension, negative cardio ROS Normal cardiovascular exam+ dysrhythmias  Rhythm:Regular Rate:Normal     Neuro/Psych  Headaches PSYCHIATRIC DISORDERS Anxiety Depression    negative neurological ROS  negative psych ROS   GI/Hepatic negative GI ROS, Neg liver ROS,,,  Endo/Other  diabetes    Renal/GU negative Renal ROS  negative genitourinary   Musculoskeletal negative musculoskeletal ROS (+)    Abdominal   Peds negative pediatric ROS (+)  Hematology negative hematology ROS (+) Blood dyscrasia, anemia   Anesthesia Other Findings   Reproductive/Obstetrics negative OB ROS                              Anesthesia Physical Anesthesia Plan  ASA: 3  Anesthesia Plan: Epidural   Post-op Pain Management: Minimal or no pain anticipated   Induction: Intravenous  PONV Risk Score and Plan: 2  Airway Management Planned: Natural Airway and Simple Face Mask  Additional Equipment: Fetal Monitoring  Intra-op Plan:   Post-operative Plan:   Informed Consent: I have reviewed the patients History and Physical, chart, labs and discussed the procedure including the risks, benefits and alternatives for the proposed anesthesia with the patient or authorized representative who has indicated his/her understanding and acceptance.     Dental Advisory Given  Plan Discussed with:  Anesthesiologist  Anesthesia Plan Comments: (Labs checked- platelets confirmed with RN in room. Fetal heart tracing, per RN, reported to be stable enough for sitting procedure. Discussed epidural, and patient consents to the procedure:  included risk of possible headache,backache, failed block, allergic reaction, and nerve injury. This patient was asked if she had any questions or concerns before the procedure started.)         Anesthesia Quick Evaluation  "

## 2024-07-29 NOTE — Progress Notes (Signed)
 Colleen Fields is a 34 y.o. [phone] at [redacted]w[redacted]d   Subjective: Patient is comfortable with epidural. She is visible anxious and tearful, unrelieved by Hydroxyzine . IV Ativan  given to help with anxiety.   Objective: BP (!) 154/99   Pulse 91   Temp (!) 97.4 F (36.3 C)   Resp 16   Ht 4' 11 (1.499 m)   Wt 99.8 kg   LMP 10/28/2023   BMI 44.43 kg/m  No intake/output data recorded. No intake/output data recorded.  FHT:  FHR: 145 bpm, variability: moderate,  accelerations:  Present,  decelerations:  Absent UC:   regular, every 2-5 minutes SVE:   Dilation: 3 Effacement (%): 80 Station: -2 Exam by:: Deonne Rooks,SNM  Labs: Lab Results  Component Value Date   WBC 6.5 07/29/2024   HGB 9.6 (L) 07/29/2024   HCT 27.4 (L) 07/29/2024   MCV 88.7 07/29/2024   PLT 243 07/29/2024    Assessment / Plan: Induction of labor due to A2GDM,  progressing well on pitocin   Labor: Progressing on Pitocin , currently on 10mu. SVE 3/80/-2 by Monico Caprice, SNM. Foley catheter placed by SNM. Plan to continue titrating Pitocin  to achieve adequate labor progress as tolerate by FHR tracing. Will perform cervical check in four hours from last exam, or sooner if clinically indicated.   Fetal Wellbeing:  Category I Pain Control:  Epidural I/D:  Positive > PCN Anticipated MOD:  NSVD  Adante Courington L Waylynn Benefiel, Student-MidWife 07/29/2024, 3:06 PM

## 2024-07-30 ENCOUNTER — Encounter (HOSPITAL_COMMUNITY): Payer: Self-pay | Admitting: Obstetrics and Gynecology

## 2024-07-30 MED ORDER — COCONUT OIL OIL: 1.0000 | TOPICAL_OIL | Status: DC | PRN

## 2024-07-30 MED ORDER — TETANUS-DIPHTH-ACELL PERTUSSIS 5-2-15.5 LF-MCG/0.5 IM SUSP: 0.5000 mL | Freq: Once | INTRAMUSCULAR | Status: DC

## 2024-07-30 MED ORDER — BENZOCAINE-MENTHOL 20-0.5 % EX AERO: 1.0000 | INHALATION_SPRAY | CUTANEOUS | Status: DC | PRN

## 2024-07-30 MED ORDER — FERROUS SULFATE 325 (65 FE) MG PO TABS
325.0000 mg | ORAL_TABLET | ORAL | Status: DC
  Administered 2024-07-30: 325 mg via ORAL
  Filled 2024-07-30: qty 1

## 2024-07-30 MED ORDER — ZOLPIDEM TARTRATE 5 MG PO TABS: 5.0000 mg | ORAL_TABLET | Freq: Every evening | ORAL | Status: DC | PRN

## 2024-07-30 MED ORDER — HYDRALAZINE HCL 20 MG/ML IJ SOLN: 10.0000 mg | INTRAMUSCULAR | Status: DC | PRN

## 2024-07-30 MED ORDER — SENNOSIDES-DOCUSATE SODIUM 8.6-50 MG PO TABS
2.0000 | ORAL_TABLET | Freq: Every day | ORAL | Status: DC
  Administered 2024-07-30 – 2024-07-31 (×2): 2 via ORAL
  Filled 2024-07-30 (×2): qty 2

## 2024-07-30 MED ORDER — WITCH HAZEL-GLYCERIN EX PADS: 1.0000 | MEDICATED_PAD | CUTANEOUS | Status: DC | PRN

## 2024-07-30 MED ORDER — FUROSEMIDE 20 MG PO TABS
20.0000 mg | ORAL_TABLET | Freq: Every day | ORAL | Status: DC
  Administered 2024-07-30 – 2024-07-31 (×2): 20 mg via ORAL
  Filled 2024-07-30 (×3): qty 1

## 2024-07-30 MED ORDER — POTASSIUM CHLORIDE CRYS ER 20 MEQ PO TBCR
20.0000 meq | EXTENDED_RELEASE_TABLET | Freq: Every day | ORAL | Status: DC
  Administered 2024-07-30 – 2024-07-31 (×2): 20 meq via ORAL
  Filled 2024-07-30 (×2): qty 1

## 2024-07-30 MED ORDER — PRENATAL MULTIVITAMIN CH
1.0000 | ORAL_TABLET | Freq: Every day | ORAL | Status: DC
  Administered 2024-07-30 – 2024-07-31 (×2): 1 via ORAL
  Filled 2024-07-30 (×2): qty 1

## 2024-07-30 MED ORDER — ONDANSETRON HCL 4 MG/2ML IJ SOLN: 4.0000 mg | INTRAMUSCULAR | Status: DC | PRN

## 2024-07-30 MED ORDER — NIFEDIPINE ER OSMOTIC RELEASE 30 MG PO TB24
30.0000 mg | ORAL_TABLET | Freq: Every day | ORAL | Status: DC
  Administered 2024-07-30: 30 mg via ORAL
  Filled 2024-07-30: qty 1

## 2024-07-30 MED ORDER — LABETALOL HCL 5 MG/ML IV SOLN: 80.0000 mg | INTRAVENOUS | Status: DC | PRN

## 2024-07-30 MED ORDER — OXYCODONE HCL 5 MG PO TABS
5.0000 mg | ORAL_TABLET | ORAL | Status: DC | PRN
  Administered 2024-07-30 – 2024-07-31 (×4): 5 mg via ORAL
  Filled 2024-07-30 (×4): qty 1

## 2024-07-30 MED ORDER — SIMETHICONE 80 MG PO CHEW: 80.0000 mg | CHEWABLE_TABLET | ORAL | Status: DC | PRN

## 2024-07-30 MED ORDER — IBUPROFEN 600 MG PO TABS
600.0000 mg | ORAL_TABLET | Freq: Four times a day (QID) | ORAL | Status: DC | PRN
  Administered 2024-07-30 (×2): 600 mg via ORAL
  Filled 2024-07-30 (×2): qty 1

## 2024-07-30 MED ORDER — IBUPROFEN 600 MG PO TABS: 600.0000 mg | ORAL_TABLET | Freq: Four times a day (QID) | ORAL | Status: DC

## 2024-07-30 MED ORDER — LABETALOL HCL 5 MG/ML IV SOLN: 40.0000 mg | INTRAVENOUS | Status: DC | PRN

## 2024-07-30 MED ORDER — ACETAMINOPHEN 325 MG PO TABS
650.0000 mg | ORAL_TABLET | ORAL | Status: DC | PRN
  Administered 2024-07-30 (×2): 650 mg via ORAL
  Filled 2024-07-30 (×2): qty 2

## 2024-07-30 MED ORDER — MISOPROSTOL 200 MCG PO TABS
1000.0000 ug | ORAL_TABLET | Freq: Once | ORAL | Status: AC
  Administered 2024-07-30: 1000 ug via RECTAL
  Filled 2024-07-30: qty 5

## 2024-07-30 MED ORDER — DIBUCAINE (PERIANAL) 1 % EX OINT: 1.0000 | TOPICAL_OINTMENT | CUTANEOUS | Status: DC | PRN

## 2024-07-30 MED ORDER — LABETALOL HCL 5 MG/ML IV SOLN: 20.0000 mg | INTRAVENOUS | Status: DC | PRN

## 2024-07-30 MED ORDER — ONDANSETRON HCL 4 MG PO TABS: 4.0000 mg | ORAL_TABLET | ORAL | Status: DC | PRN

## 2024-07-30 MED ORDER — ACETAMINOPHEN 500 MG PO TABS
1000.0000 mg | ORAL_TABLET | Freq: Four times a day (QID) | ORAL | Status: DC
  Administered 2024-07-30 – 2024-07-31 (×4): 1000 mg via ORAL
  Filled 2024-07-30 (×4): qty 2

## 2024-07-30 MED ORDER — HYDROXYZINE HCL 25 MG PO TABS
25.0000 mg | ORAL_TABLET | Freq: Four times a day (QID) | ORAL | Status: DC | PRN
  Administered 2024-07-30: 25 mg via ORAL
  Filled 2024-07-30: qty 1

## 2024-07-30 MED ORDER — DIPHENHYDRAMINE HCL 25 MG PO CAPS: 25.0000 mg | ORAL_CAPSULE | Freq: Four times a day (QID) | ORAL | Status: DC | PRN

## 2024-07-30 NOTE — Progress Notes (Signed)
" °   07/30/24 1500  Vital Signs  BP (!) 146/105   Notified [place]  Claudene CNM about pt's blood pressures. Currently, pt does not have abdominal pain or feels SOB. Per midwifes request, notify if pressures are above 160/110. Pt thinks elevated blood pressures are associated with anxiety. New orders include atarax  and ibuprofen .  "

## 2024-07-30 NOTE — Progress Notes (Signed)
 Call MD if Blood  pressure is 160/110.

## 2024-07-30 NOTE — Anesthesia Postprocedure Evaluation (Signed)
"   Anesthesia Post Note  Patient: Ashia Dehner  Procedure(s) Performed: AN AD HOC LABOR EPIDURAL     Patient location during evaluation: Mother Baby Anesthesia Type: Epidural Level of consciousness: awake, awake and alert and oriented Pain management: satisfactory to patient Vital Signs Assessment: post-procedure vital signs reviewed and stable Respiratory status: spontaneous breathing, nonlabored ventilation and respiratory function stable Cardiovascular status: blood pressure returned to baseline and stable Postop Assessment: no backache, no headache, patient able to bend at knees, no apparent nausea or vomiting, able to ambulate and adequate PO intake Anesthetic complications: no   No notable events documented.  Last Vitals:  Vitals:   07/30/24 0431 07/30/24 0526  BP: 135/79 (!) 140/91  Pulse: (!) 102 100  Resp: 18 18  Temp: 37.2 C 36.7 C  SpO2: 100% 100%    Last Pain:  Vitals:   07/30/24 0609  TempSrc:   PainSc: 0-No pain   Pain Goal:                   Amarilis Belflower      "

## 2024-07-30 NOTE — Lactation Note (Addendum)
 This note was copied from a baby's chart. Lactation Consultation Note  Patient Name: Colleen Fields Date: 07/30/2024 Age:35 hours Reason for consult: Initial assessment;Maternal endocrine disorder;Early term 37-38.6wks  P4. Mom decided that she wanted to see Lactation. Mom has been BF well. Baby has had 10 ml bottle of formula. Suggested mom not BF longer than 30 min. For the 1st 24 hrs until the baby's glucose has stabilized. Since glucose level was 41 suggested mom give more formula to bring it back up and baby rest. Next feeding baby can BF. Explained all of this to mom. Mom appreciative of LC coming by to see her. Praised mom for good BF and her easily expressed colostrum. Encouraged mom to hold baby STS which she is doing and BF.  Maternal Data Has patient been taught Hand Expression?: Yes Does the patient have breastfeeding experience prior to this delivery?: Yes How long did the patient breastfeed?: 1st child now 19yrs old for 2 yrs, 2nd child now 11 yrs old for 2 months (went back to work), 3rd child now 6 yrs old for 3 yrs.  Feeding Mother's Current Feeding Choice: Breast Milk and Formula  LATCH Score       Type of Nipple: Everted at rest and after stimulation  Comfort (Breast/Nipple): Soft / non-tender         Lactation Tools Discussed/Used    Interventions Interventions: Breast feeding basics reviewed;Skin to skin;Breast massage;Hand express;Education  Discharge Pump: DEBP (mom doesn't know about what kind of pump she has. mom will call her insurance company when she decides what kind of pump she wants.)  Consult Status Consult Status: Follow-up Date: 07/31/24 Follow-up type: In-patient    Malina Geers G 07/30/2024, 6:23 AM

## 2024-07-30 NOTE — Lactation Note (Addendum)
 This note was copied from a baby's chart. Lactation Consultation Note  Patient Name: Colleen Fields Unijb'd Date: 07/30/2024 Age:35 hours   P4. Mom is undecided about if she wants to see Lactation or not. Will decide in am.   Maternal Data    Feeding Nipple Type: Slow - flow  LATCH Score Latch: Too sleepy or reluctant, no latch achieved, no sucking elicited.  Audible Swallowing: None  Type of Nipple: Everted at rest and after stimulation  Comfort (Breast/Nipple): Soft / non-tender  Hold (Positioning): No assistance needed to correctly position infant at breast.  LATCH Score: 6   Lactation Tools Discussed/Used    Interventions    Discharge    Consult Status      Colleen Fields G 07/30/2024, 4:52 AM

## 2024-07-30 NOTE — Progress Notes (Signed)
 Post Partum Day 1 Subjective: no complaints, up ad lib, voiding, and tolerating PO  Objective: Blood pressure (!) 140/91, pulse 100, temperature 98 F (36.7 C), temperature source Oral, resp. rate 18, height 4' 11 (1.499 m), weight 99.8 kg, last menstrual period 10/28/2023, SpO2 100%, unknown if currently breastfeeding.  Physical Exam:  General: alert, cooperative, and no distress Lochia: appropriate Uterine Fundus: firm Incision: N/A DVT Evaluation: No evidence of DVT seen on physical exam.  Recent Labs    07/29/24 0043 07/29/24 0722  HGB 11.5* 9.6*  HCT 32.1* 27.4*    Assessment/Plan: Plan for discharge tomorrow, Breastfeeding, and Contraception Copper  IUD   LOS: 1 day   Tawni SAUNDERS Emauri Krygier, CNM 07/30/2024, 8:29 AM
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
## 2024-07-30 NOTE — Progress Notes (Signed)
 The Rn called Marlo Matt MD regarding the patient 's increase blood pressure  (BP)of 174/95. MD stated that patient was given procardia  prior to transferring to [facility]. Also that the patient was very anxious and was given meds after delivery for this.  MD stated to wait to see if BP decreases. If BP does not decrease call MD.

## 2024-07-31 ENCOUNTER — Other Ambulatory Visit (HOSPITAL_COMMUNITY): Payer: Self-pay

## 2024-07-31 LAB — CBC
HCT: 27 % — ABNORMAL LOW (ref 36.0–46.0)
Hemoglobin: 9.5 g/dL — ABNORMAL LOW (ref 12.0–15.0)
MCH: 31.6 pg (ref 26.0–34.0)
MCHC: 35.2 g/dL (ref 30.0–36.0)
MCV: 89.7 fL (ref 80.0–100.0)
Platelets: 206 K/uL (ref 150–400)
RBC: 3.01 MIL/uL — ABNORMAL LOW (ref 3.87–5.11)
RDW: 13.7 % (ref 11.5–15.5)
WBC: 7.2 K/uL (ref 4.0–10.5)
nRBC: 0.3 % — ABNORMAL HIGH (ref 0.0–0.2)

## 2024-07-31 LAB — COMPREHENSIVE METABOLIC PANEL WITH GFR
ALT: 11 U/L (ref 0–44)
AST: 16 U/L (ref 15–41)
Albumin: 3 g/dL — ABNORMAL LOW (ref 3.5–5.0)
Alkaline Phosphatase: 62 U/L (ref 38–126)
Anion gap: 9 (ref 5–15)
BUN: 10 mg/dL (ref 6–20)
CO2: 24 mmol/L (ref 22–32)
Calcium: 9.2 mg/dL (ref 8.9–10.3)
Chloride: 104 mmol/L (ref 98–111)
Creatinine, Ser: 0.65 mg/dL (ref 0.44–1.00)
GFR, Estimated: >60 mL/min
Glucose, Bld: 130 mg/dL — ABNORMAL HIGH (ref 70–99)
Potassium: 3.5 mmol/L (ref 3.5–5.1)
Sodium: 136 mmol/L (ref 135–145)
Total Bilirubin: 0.3 mg/dL (ref 0.0–1.2)
Total Protein: 5.6 g/dL — ABNORMAL LOW (ref 6.5–8.1)

## 2024-07-31 MED ORDER — FUROSEMIDE 20 MG PO TABS
20.0000 mg | ORAL_TABLET | Freq: Every day | ORAL | 0 refills | Status: AC
  Filled 2024-07-31: qty 5, 5d supply, fill #0

## 2024-07-31 MED ORDER — ACETAMINOPHEN 500 MG PO TABS: 1000.0000 mg | ORAL_TABLET | Freq: Four times a day (QID) | ORAL | Status: AC

## 2024-07-31 MED ORDER — POTASSIUM CHLORIDE CRYS ER 20 MEQ PO TBCR
20.0000 meq | EXTENDED_RELEASE_TABLET | Freq: Every day | ORAL | 0 refills | Status: AC
  Filled 2024-07-31: qty 5, 5d supply, fill #0

## 2024-07-31 MED ORDER — SENNOSIDES-DOCUSATE SODIUM 8.6-50 MG PO TABS
2.0000 | ORAL_TABLET | Freq: Every day | ORAL | 0 refills | Status: AC
  Filled 2024-07-31: qty 60, 30d supply, fill #0

## 2024-07-31 MED ORDER — AMLODIPINE BESYLATE 10 MG PO TABS
10.0000 mg | ORAL_TABLET | Freq: Every day | ORAL | 0 refills | Status: AC
  Filled 2024-07-31: qty 30, 30d supply, fill #0

## 2024-07-31 MED ORDER — AMLODIPINE BESYLATE 5 MG PO TABS
10.0000 mg | ORAL_TABLET | Freq: Every day | ORAL | Status: DC
  Administered 2024-07-31: 10 mg via ORAL
  Filled 2024-07-31: qty 2

## 2024-07-31 MED ORDER — FERROUS SULFATE 325 (65 FE) MG PO TABS
325.0000 mg | ORAL_TABLET | ORAL | 0 refills | Status: AC
  Filled 2024-07-31: qty 15, 30d supply, fill #0

## 2024-07-31 MED ORDER — IBUPROFEN 600 MG PO TABS
600.0000 mg | ORAL_TABLET | Freq: Four times a day (QID) | ORAL | 0 refills | Status: AC | PRN
  Filled 2024-07-31: qty 30, 8d supply, fill #0

## 2024-07-31 NOTE — Lactation Note (Signed)
 This note was copied from a baby's chart. Lactation Consultation Note  Patient Name: Colleen Fields Date: 07/31/2024 Age:35 hours Reason for consult: Follow-up assessment;Early term 37-38.6wks;Maternal endocrine disorder  P4- MOB reports that at first infant would struggle with opening up her mouth wide enough to latch, but as of today infant has been much more sufficient. MOB denies having any questions or concerns at this time. LC reviewed feeding infant on cue 8-12x in 24 hrs, not allowing infant to go over 3 hrs without a feeding, CDC milk storage guidelines, LC services handout and engorgement/breast care. LC encouraged MOB to call for further assistance as needed.  Maternal Data Has patient been taught Hand Expression?: Yes Does the patient have breastfeeding experience prior to this delivery?: Yes  Feeding Mother's Current Feeding Choice: Breast Milk and Formula  Interventions Interventions: Breast feeding basics reviewed;Education;LC Services brochure  Discharge Discharge Education: Engorgement and breast care;Warning signs for feeding baby Pump: DEBP;Manual;Personal  Consult Status Consult Status: Complete Date: 07/31/24    Recardo Hoit BS, IBCLC 07/31/2024, 3:47 PM

## 2024-08-08 ENCOUNTER — Other Ambulatory Visit: Payer: Self-pay

## 2024-08-08 ENCOUNTER — Ambulatory Visit: Payer: Self-pay

## 2024-08-09 ENCOUNTER — Other Ambulatory Visit

## 2024-08-09 ENCOUNTER — Ambulatory Visit: Payer: Self-pay

## 2024-08-09 ENCOUNTER — Telehealth (HOSPITAL_COMMUNITY): Payer: Self-pay | Admitting: *Deleted

## 2024-08-09 NOTE — Telephone Encounter (Signed)
 08/09/2024  Name: Colleen Fields MRN: [id number] DOB: 23-Oct-1990  Reason for Call:  Transition of Care Hospital Discharge Call  Contact Status: Patient Contact Status: Unable to contact (no answer; voicemail box not set up; unable to leave message)  Language assistant needed:          Follow-Up Questions:    Van Postnatal Depression Scale:  In the Past 7 Days:    PHQ2-9 Depression Scale:     Discharge Follow-up:    Post-discharge interventions: NA  Adore Kithcart,RN  08/09/2024 1209

## 2024-08-11 NOTE — BH Specialist Note (Unsigned)
 "   Integrated Behavioral Health via Telemedicine Visit  08/11/2024 [name] [id number]  Number of Integrated Behavioral Health Clinician visits: No data recorded Session Start time: No data recorded  Session End time: No data recorded Total time in minutes: No data recorded   Referring Provider: Rollo Bring, MD Patient/Family location: Home*** [facility] Provider location: [facility]  All persons participating in visit: Patient Colleen Fields and [facility] Colleen Fields ***  Types of Service: {CHL AMB TYPE OF [phone]}  I connected with Colleen Fields and/or Colleen Fields's {family members:20773} via  Telephone or Video Enabled Telemedicine Application  (Video is Caregility application) and verified that I am speaking with the correct person using two identifiers. Discussed confidentiality: {YES/NO:21197}  I discussed the limitations of telemedicine and the availability of in person appointments.  Discussed there is a possibility of technology failure and discussed alternative modes of communication if that failure occurs.  I discussed that engaging in this telemedicine visit, they consent to the provision of behavioral healthcare and the services will be billed under their insurance.  Patient and/or legal guardian expressed understanding and consented to Telemedicine visit: {YES/NO:21197}  Presenting Concerns: Patient and/or family reports the following symptoms/concerns: *** Duration of problem: ***; Severity of problem: {Mild/Moderate/Severe:20260}  Patient and/or Family's Strengths/Protective Factors: {CHL AMB BH PROTECTIVE FACTORS:7183814040}  Goals Addressed: Patient will:  Reduce symptoms of: {IBH Symptoms:21014056}   Increase knowledge and/or ability of: {IBH Patient Tools:21014057}   Demonstrate ability to: {IBH Goals:21014053}  Progress towards Goals: {CHL AMB BH PROGRESS TOWARDS  [phone]}    Interventions: Interventions utilized:  {IBH Interventions:21014054} Standardized Assessments completed: {IBH Screening Tools:21014051}    Patient and/or Family Response: ***  Clinical Assessment/Diagnosis  No diagnosis found.    Assessment: Patient currently experiencing ***.   Patient may benefit from ***.  Plan: Follow up with behavioral health clinician on : *** Behavioral recommendations: *** Referral(s): {IBH Referrals:21014055}  I discussed the assessment and treatment plan with the patient and/or parent/guardian. They were provided an opportunity to ask questions and all were answered. They agreed with the plan and demonstrated an understanding of the instructions.   They were advised to call back or seek an in-person evaluation if the symptoms worsen or if the condition fails to improve as anticipated.  Warren JAYSON Mering, LCSW     09/23/2020    9:13 AM 09/12/2020    2:13 PM 08/22/2020    8:19 AM 08/14/2020    3:58 PM 06/26/2020   10:06 AM  Depression screen PHQ 2/9  Decreased Interest 1 3 2 3 2   Down, Depressed, Hopeless 1 1 2 2 2   PHQ - 2 Score 2 4 4 5 4   Altered sleeping 1 0 2 3 3   Tired, decreased energy 1 3 2 3 3   Change in appetite 1 0 3 3 2   Feeling bad or failure about yourself  1 0 3 0 2  Trouble concentrating 1 1 1 2 2   Moving slowly or fidgety/restless 1 2 0 1 2  Suicidal thoughts 1 0 0 0 2  PHQ-9 Score 9  10  15  17  20       Data saved with a previous flowsheet row definition      09/23/2020    9:13 AM 09/12/2020    2:14 PM 08/22/2020    8:20 AM 08/14/2020    3:59 PM  GAD 7 : Generalized Anxiety Score  Nervous, Anxious, on  Edge 1  2  3  3    Control/stop worrying 2  1  2  3    Worry too much - different things 2  2  3  3    Trouble relaxing 1  0  3  0   Restless 0  0  0  0   Easily annoyed or irritable 2  1  3  3    Afraid - awful might happen 0  0  1  0   Total GAD 7 Score 8 6 15 12      Data saved with a previous  flowsheet row definition       07/30/2024   10:14 AM 09/23/2020    9:06 AM 08/24/2020   10:00 PM 03/09/2018    3:34 PM 02/16/2018    2:03 PM  Edinburgh Postnatal Depression Scale Screening Tool  I have been able to laugh and see the funny side of things. -- 0  0  0  2   I have looked forward with enjoyment to things.  0  0  1  2   I have blamed myself unnecessarily when things went wrong.  0  2  0  3   I have been anxious or worried for no good reason.  0  2  0  0   I have felt scared or panicky for no good reason.  0  0  0  3   Things have been getting on top of me.  0  2  0  0   I have been so unhappy that I have had difficulty sleeping.  0  1  0  0   I have felt sad or miserable.  0  2  0  2   I have been so unhappy that I have been crying.  0  1  0  2   The thought of harming myself has occurred to me.  0  0  0  0   Edinburgh Postnatal Depression Scale Total  0  10  1  14       Data saved with a previous flowsheet row definition     "

## 2024-08-11 NOTE — Progress Notes (Unsigned)
"    Behavioral Health Treatment Plan   Name:Colleen Fields  MRN: [id number]   Treatment Plan Development Date: ***   Strengths: {BH Strengths:210964339}  Supports: {BH Supports:210964340}   Client Statement of Needs: ***   Treatment Level:***  Client Treatment Preferences:***   {BH Diagnoses:210964321}   Expected duration of treatment: ***  Party responsible for implementation of interventions: ***.   This plan has been reviewed and created by the following participants: ***   A new plan will be created at least every 12 months.  The patient fully participated in the development of treatment plan with the clinician and verbally consents to such treatment.   Patient Treatment Plan Signature Obtained: {BH Signature Options:210964341}   Warren JAYSON Mering, LCSW  "

## 2024-08-15 ENCOUNTER — Telehealth: Payer: Self-pay | Admitting: Family Medicine

## 2024-08-15 NOTE — Telephone Encounter (Signed)
 Please disregard this error

## 2024-08-16 ENCOUNTER — Ambulatory Visit: Payer: Self-pay | Admitting: Clinical

## 2024-08-16 NOTE — BH Specialist Note (Unsigned)
"    Behavioral Health Treatment Plan   Name:Colleen Fields  MRN: [id number]   Treatment Plan Development Date: ***   Strengths: {BH Strengths:210964339}  Supports: {BH Supports:210964340}   Client Statement of Needs: ***   Treatment Level:***  Client Treatment Preferences:***   {BH Diagnoses:210964321}   Expected duration of treatment: ***  Party responsible for implementation of interventions: ***.   This plan has been reviewed and created by the following participants: ***   A new plan will be created at least every 12 months.  The patient fully participated in the development of treatment plan with the clinician and verbally consents to such treatment.   Patient Treatment Plan Signature Obtained: {BH Signature Options:210964341}   Warren JAYSON Mering, LCSW  "

## 2024-08-16 NOTE — BH Specialist Note (Unsigned)
 "   Integrated Behavioral Health via Telemedicine Visit  08/16/2024 Colleen Fields [id number]  Number of Integrated Behavioral Health Clinician visits: No data recorded Session Start time: No data recorded  Session End time: No data recorded Total time in minutes: No data recorded   Referring Provider: Rollo Bring, MD Colleen/Family location: Home*** [facility] Provider location: [facility]  All persons participating in visit: Colleen Fields and [facility] Reginald Mangels ***  Types of Service: {CHL AMB TYPE OF [phone]}  I connected with [name] and/or [name]'s {family members:20773} via  Telephone or Video Enabled Telemedicine Application  (Video is Caregility application) and verified that I am speaking with the correct person using two identifiers. Discussed confidentiality: {YES/NO:21197}  I discussed the limitations of telemedicine and the availability of in person appointments.  Discussed there is a possibility of technology failure and discussed alternative modes of communication if that failure occurs.  I discussed that engaging in this telemedicine visit, they consent to the provision of behavioral healthcare and the services will be billed under their insurance.  Colleen and/or legal guardian expressed understanding and consented to Telemedicine visit: {YES/NO:21197}  Presenting Concerns: Colleen and/or family reports the following symptoms/concerns: *** Duration of problem: ***; Severity of problem: {Mild/Moderate/Severe:20260}  Colleen and/or Family's Strengths/Protective Factors: {CHL AMB BH PROTECTIVE FACTORS:[phone]}  Goals Addressed: Colleen will:  Reduce symptoms of: {IBH Symptoms:21014056}   Increase knowledge and/or ability of: {IBH Colleen Tools:21014057}   Demonstrate ability to: {IBH Goals:21014053}  Progress towards Goals: {CHL AMB BH PROGRESS TOWARDS  [phone]}    Interventions: Interventions utilized:  {IBH Interventions:21014054} Standardized Assessments completed: {IBH Screening Tools:21014051}    Colleen and/or Family Response: ***  Clinical Assessment/Diagnosis  No diagnosis found.    Assessment: Colleen currently experiencing ***.   Colleen may benefit from ***.  Plan: Follow up with behavioral health clinician on : *** Behavioral recommendations: *** Referral(s): {IBH Referrals:21014055}  I discussed the assessment and treatment plan with the Colleen and/or parent/guardian. They were provided an opportunity to ask questions and all were answered. They agreed with the plan and demonstrated an understanding of the instructions.   They were advised to call back or seek an in-person evaluation if the symptoms worsen or if the condition fails to improve as anticipated.  Warren JAYSON Mering, LCSW     09/23/2020    9:13 AM 09/12/2020    2:13 PM 08/22/2020    8:19 AM 08/14/2020    3:58 PM 06/26/2020   10:06 AM  Depression screen PHQ 2/9  Decreased Interest 1 3 2 3 2   Down, Depressed, Hopeless 1 1 2 2 2   PHQ - 2 Score 2 4 4 5 4   Altered sleeping 1 0 2 3 3   Tired, decreased energy 1 3 2 3 3   Change in appetite 1 0 3 3 2   Feeling bad or failure about yourself  1 0 3 0 2  Trouble concentrating 1 1 1 2 2   Moving slowly or fidgety/restless 1 2 0 1 2  Suicidal thoughts 1 0 0 0 2  PHQ-9 Score 9  10  15  17  20       Data saved with a previous flowsheet row definition      09/23/2020    9:13 AM 09/12/2020    2:14 PM 08/22/2020    8:20 AM 08/14/2020    3:59 PM  GAD 7 : Generalized Anxiety Score  Nervous, Anxious, on  Edge 1  2  3  3    Control/stop worrying 2  1  2  3    Worry too much - different things 2  2  3  3    Trouble relaxing 1  0  3  0   Restless 0  0  0  0   Easily annoyed or irritable 2  1  3  3    Afraid - awful might happen 0  0  1  0   Total GAD 7 Score 8 6 15 12      Data saved with a previous  flowsheet row definition       07/30/2024   10:14 AM 09/23/2020    9:06 AM 08/24/2020   10:00 PM 03/09/2018    3:34 PM 02/16/2018    2:03 PM  Edinburgh Postnatal Depression Scale Screening Tool  I have been able to laugh and see the funny side of things. -- 0  0  0  2   I have looked forward with enjoyment to things.  0  0  1  2   I have blamed myself unnecessarily when things went wrong.  0  2  0  3   I have been anxious or worried for no good reason.  0  2  0  0   I have felt scared or panicky for no good reason.  0  0  0  3   Things have been getting on top of me.  0  2  0  0   I have been so unhappy that I have had difficulty sleeping.  0  1  0  0   I have felt sad or miserable.  0  2  0  2   I have been so unhappy that I have been crying.  0  1  0  2   The thought of harming myself has occurred to me.  0  0  0  0   Edinburgh Postnatal Depression Scale Total  0  10  1  14       Data saved with a previous flowsheet row definition     "

## 2024-08-17 ENCOUNTER — Encounter

## 2024-09-04 ENCOUNTER — Ambulatory Visit: Payer: Self-pay | Admitting: Obstetrics and Gynecology
